# Patient Record
Sex: Male | Born: 1945 | Race: Black or African American | Hispanic: No | Marital: Married | Smoking: Never smoker
Health system: Southern US, Community
[De-identification: ages and names within clinical notes are randomized; demographics above are authoritative.]

## PROBLEM LIST (undated history)

## (undated) DIAGNOSIS — K635 Polyp of colon: Secondary | ICD-10-CM

## (undated) DIAGNOSIS — R42 Dizziness and giddiness: Secondary | ICD-10-CM

## (undated) DIAGNOSIS — C189 Malignant neoplasm of colon, unspecified: Secondary | ICD-10-CM

## (undated) DIAGNOSIS — R7303 Prediabetes: Secondary | ICD-10-CM

## (undated) DIAGNOSIS — I10 Essential (primary) hypertension: Secondary | ICD-10-CM

## (undated) DIAGNOSIS — I2510 Atherosclerotic heart disease of native coronary artery without angina pectoris: Secondary | ICD-10-CM

## (undated) DIAGNOSIS — K219 Gastro-esophageal reflux disease without esophagitis: Secondary | ICD-10-CM

## (undated) DIAGNOSIS — F419 Anxiety disorder, unspecified: Secondary | ICD-10-CM

## (undated) DIAGNOSIS — F0390 Unspecified dementia without behavioral disturbance: Secondary | ICD-10-CM

## (undated) DIAGNOSIS — G4733 Obstructive sleep apnea (adult) (pediatric): Secondary | ICD-10-CM

## (undated) DIAGNOSIS — E7800 Pure hypercholesterolemia, unspecified: Secondary | ICD-10-CM

## (undated) DIAGNOSIS — R739 Hyperglycemia, unspecified: Secondary | ICD-10-CM

## (undated) HISTORY — DX: Unspecified dementia, unspecified severity, without behavioral disturbance, psychotic disturbance, mood disturbance, and anxiety: F03.90

## (undated) HISTORY — DX: Atherosclerotic heart disease of native coronary artery without angina pectoris: I25.10

## (undated) HISTORY — DX: Hyperglycemia, unspecified: R73.9

## (undated) HISTORY — PX: COLONOSCOPY: SHX174

## (undated) HISTORY — DX: Malignant neoplasm of colon, unspecified: C18.9

## (undated) HISTORY — DX: Dizziness and giddiness: R42

## (undated) HISTORY — DX: Anxiety disorder, unspecified: F41.9

## (undated) HISTORY — PX: OTHER SURGICAL HISTORY: SHX169

## (undated) HISTORY — DX: Polyp of colon: K63.5

## (undated) HISTORY — DX: Essential (primary) hypertension: I10

## (undated) HISTORY — DX: Gastro-esophageal reflux disease without esophagitis: K21.9

## (undated) HISTORY — PX: CARDIAC CATHETERIZATION: SHX172

## (undated) HISTORY — DX: Prediabetes: R73.03

## (undated) HISTORY — DX: Obstructive sleep apnea (adult) (pediatric): G47.33

---

## 1994-12-11 HISTORY — PX: PARTIAL COLECTOMY: SHX5273

## 1999-07-28 ENCOUNTER — Ambulatory Visit (HOSPITAL_COMMUNITY): Admission: RE | Admit: 1999-07-28 | Discharge: 1999-07-28 | Payer: Self-pay | Admitting: Gastroenterology

## 2003-03-03 ENCOUNTER — Encounter (INDEPENDENT_AMBULATORY_CARE_PROVIDER_SITE_OTHER): Payer: Self-pay

## 2003-03-03 ENCOUNTER — Ambulatory Visit (HOSPITAL_COMMUNITY): Admission: RE | Admit: 2003-03-03 | Discharge: 2003-03-03 | Payer: Self-pay | Admitting: Gastroenterology

## 2006-09-04 ENCOUNTER — Encounter: Admission: RE | Admit: 2006-09-04 | Discharge: 2006-09-04 | Payer: Self-pay | Admitting: Gastroenterology

## 2008-08-26 ENCOUNTER — Emergency Department (HOSPITAL_COMMUNITY): Admission: EM | Admit: 2008-08-26 | Discharge: 2008-08-26 | Payer: Self-pay | Admitting: Emergency Medicine

## 2010-10-28 ENCOUNTER — Observation Stay (HOSPITAL_COMMUNITY): Admission: RE | Admit: 2010-10-28 | Discharge: 2010-10-29 | Payer: Self-pay | Admitting: Cardiology

## 2010-10-28 HISTORY — PX: CARDIAC CATHETERIZATION: SHX172

## 2011-01-10 ENCOUNTER — Other Ambulatory Visit: Payer: Self-pay | Admitting: Internal Medicine

## 2011-01-10 DIAGNOSIS — R059 Cough, unspecified: Secondary | ICD-10-CM

## 2011-01-10 DIAGNOSIS — R05 Cough: Secondary | ICD-10-CM

## 2011-01-19 ENCOUNTER — Ambulatory Visit
Admission: RE | Admit: 2011-01-19 | Discharge: 2011-01-19 | Disposition: A | Discharge status: Home / Self Care | Payer: Medicare Other | Source: Ambulatory Visit | Attending: Internal Medicine | Admitting: Internal Medicine

## 2011-01-19 DIAGNOSIS — R05 Cough: Secondary | ICD-10-CM

## 2011-01-19 DIAGNOSIS — R059 Cough, unspecified: Secondary | ICD-10-CM

## 2011-02-21 LAB — BASIC METABOLIC PANEL
BUN: 8 mg/dL (ref 6–23)
CO2: 30 mEq/L (ref 19–32)
Calcium: 8.8 mg/dL (ref 8.4–10.5)
Chloride: 104 mEq/L (ref 96–112)
Creatinine, Ser: 0.8 mg/dL (ref 0.4–1.5)
GFR calc non Af Amer: >60 mL/min (ref >60)
Glucose, Bld: 104 mg/dL — ABNORMAL HIGH (ref 70–99)
Potassium: 3.5 mEq/L (ref 3.5–5.1)
Sodium: 141 mEq/L (ref 135–145)

## 2011-02-21 LAB — CBC
HCT: 43.2 % (ref 39.0–52.0)
HCT: 44.9 % (ref 39.0–52.0)
Hemoglobin: 14.3 g/dL (ref 13.0–17.0)
Hemoglobin: 15.2 g/dL (ref 13.0–17.0)
MCH: 31.5 pg (ref 26.0–34.0)
MCH: 32 pg (ref 26.0–34.0)
MCHC: 33.1 g/dL (ref 30.0–36.0)
MCHC: 33.9 g/dL (ref 30.0–36.0)
MCV: 94.5 fL (ref 78.0–100.0)
MCV: 95.2 fL (ref 78.0–100.0)
Platelets: 223 10*3/uL (ref 150–400)
Platelets: 227 10*3/uL (ref 150–400)
RBC: 4.54 MIL/uL (ref 4.22–5.81)
RBC: 4.75 MIL/uL (ref 4.22–5.81)
RDW: 12.9 % (ref 11.5–15.5)
RDW: 13.1 % (ref 11.5–15.5)
WBC: 11.2 10*3/uL — ABNORMAL HIGH (ref 4.0–10.5)
WBC: 11.9 10*3/uL — ABNORMAL HIGH (ref 4.0–10.5)

## 2011-02-21 LAB — POCT I-STAT 3, ART BLOOD GAS (G3+)
Bicarbonate: 26.1 mEq/L — ABNORMAL HIGH (ref 20.0–24.0)
O2 Saturation: 91 %
Patient temperature: 98.6
TCO2: 27 mmol/L (ref 0–100)
pCO2 arterial: 45.3 mmHg — ABNORMAL HIGH (ref 35.0–45.0)
pH, Arterial: 7.369 (ref 7.350–7.450)
pO2, Arterial: 62 mmHg — ABNORMAL LOW (ref 80.0–100.0)

## 2011-02-21 LAB — POCT I-STAT 3, VENOUS BLOOD GAS (G3P V)
Acid-Base Excess: 2 mmol/L (ref 0.0–2.0)
Acid-Base Excess: 4 mmol/L — ABNORMAL HIGH (ref 0.0–2.0)
Bicarbonate: 28.6 mEq/L — ABNORMAL HIGH (ref 20.0–24.0)
Bicarbonate: 30.1 mEq/L — ABNORMAL HIGH (ref 20.0–24.0)
O2 Saturation: 49 %
O2 Saturation: 70 %
Patient temperature: 98.6
TCO2: 30 mmol/L (ref 0–100)
TCO2: 32 mmol/L (ref 0–100)
pCO2, Ven: 51.5 mmHg — ABNORMAL HIGH (ref 45.0–50.0)
pCO2, Ven: 53.5 mmHg — ABNORMAL HIGH (ref 45.0–50.0)
pH, Ven: 7.335 — ABNORMAL HIGH (ref 7.250–7.300)
pH, Ven: 7.374 — ABNORMAL HIGH (ref 7.250–7.300)
pO2, Ven: 29 mmHg — CL (ref 30.0–45.0)
pO2, Ven: 38 mmHg (ref 30.0–45.0)

## 2011-03-14 ENCOUNTER — Other Ambulatory Visit: Payer: Self-pay | Admitting: Otolaryngology

## 2011-04-11 ENCOUNTER — Ambulatory Visit
Admission: RE | Admit: 2011-04-11 | Discharge: 2011-04-11 | Disposition: A | Discharge status: Home / Self Care | Payer: Medicare Other | Source: Ambulatory Visit | Attending: Otolaryngology | Admitting: Otolaryngology

## 2011-04-27 ENCOUNTER — Other Ambulatory Visit: Payer: Self-pay | Admitting: Family Medicine

## 2011-04-27 ENCOUNTER — Ambulatory Visit
Admission: RE | Admit: 2011-04-27 | Discharge: 2011-04-27 | Disposition: A | Discharge status: Home / Self Care | Payer: Medicare Other | Source: Ambulatory Visit | Attending: Family Medicine | Admitting: Family Medicine

## 2011-04-27 DIAGNOSIS — J209 Acute bronchitis, unspecified: Secondary | ICD-10-CM

## 2011-04-28 NOTE — Op Note (Signed)
NAMEMERRIL, NAGY                            ACCOUNT NO.:  [id number]   MEDICAL RECORD NO.:  [id number]                   PATIENT TYPE:  AMB   LOCATION:  ENDO                                 FACILITY:  [facility]   PHYSICIAN:  Petra Kuba, M.D.                 DATE OF BIRTH:  1946-03-01   DATE OF PROCEDURE:  03/03/2003  DATE OF DISCHARGE:                                 OPERATIVE REPORT   PROCEDURE:  Colonoscopy with polypectomy.   INDICATIONS FOR PROCEDURE:  A patient with a history of colon cancer due for  repeat screening. Consent was signed after risks, benefits, methods, and  options were thoroughly discussed multiple times in the past.   MEDICINES USED:  Demerol 70, Versed 5.   DESCRIPTION OF PROCEDURE:  Rectal inspection was pertinent for external  hemorrhoids, small. Digital exam was negative. The video colonoscope was  inserted, easily advanced around the colon to the anastomosis. On insertion,  some left sided diverticula were seen as well as a small sigmoid polyp which  was hot biopsied on insertion. No other abnormalities were seen on  insertion. The anastomosis was identified by the colon side small bowel  anastomosis. Based on a very tortuous turn could not advance deep into the  small bowel but could see the customary lymphoid tissue of the small bowel.  Some of it did extend into the anastomosis and a few biopsies of that area  were obtained just to make sure this was not polypoid tissue. That was put  in the second container. The scope was slowly withdrawn. The prep was  adequate. There was some liquid stool that required washing and suctioning.  The transverse was normal and as the scope was withdrawn around the left  side of the colon in the mid to distal sigmoid and proximal rectum, multiple  hyperplastic appearing polyps were seen some of which were hot biopsied and  put in the first container with the other polyp seen on insertion. That  polypectomy site was  seen without obvious residual polyp. Once back in the  rectum, the scope was retroflexed pertinent for some small internal  hemorrhoids. The scope was straightened and readvanced a short ways up the  left side of the colon, air was suctioned, scope removed. The patient  tolerated the procedure well. There was no obvious or immediate  complications.   ENDOSCOPIC DIAGNOSIS:  1. Internal and external hemorrhoids.  2. Left sided diverticula.  3. Multiple left sided hyperplastic appearing polyps hot biopsied.  4. Anastomosis with a small bowel end to colon side anastomosis.  5. Biopsy of the anastomosis probable lymphoid hyperplasia.    PLAN:  Await pathology to determine future colonic screening. Happy to see  back p.r.n., otherwise, return care to Dr. Cyndie Chime for the customary  post colon cancer health care and maintenance. Happy to see back sooner  p.r.n.  Petra Kuba, M.D.    MEM/MEDQ  D:  03/03/2003  T:  03/03/2003  Job:  [id number]   cc:   Leonie Man, M.D.  [address], [address]  [place]  [place] [address]  Fax: [phone]   Genene Churn. Cyndie Chime, M.D.  [address] [facility]  [place]  [place] [address]  Fax: [phone]

## 2011-05-04 ENCOUNTER — Encounter: Payer: Self-pay | Admitting: Gastroenterology

## 2011-06-20 ENCOUNTER — Ambulatory Visit: Payer: Medicare Other | Admitting: Gastroenterology

## 2011-09-11 LAB — GLUCOSE, CAPILLARY: Glucose-Capillary: 102 — ABNORMAL HIGH

## 2015-01-20 DIAGNOSIS — I10 Essential (primary) hypertension: Secondary | ICD-10-CM | POA: Diagnosis not present

## 2015-01-20 DIAGNOSIS — R0609 Other forms of dyspnea: Secondary | ICD-10-CM | POA: Diagnosis not present

## 2015-01-20 DIAGNOSIS — N529 Male erectile dysfunction, unspecified: Secondary | ICD-10-CM | POA: Diagnosis not present

## 2015-01-20 DIAGNOSIS — I2510 Atherosclerotic heart disease of native coronary artery without angina pectoris: Secondary | ICD-10-CM | POA: Diagnosis not present

## 2015-03-09 DIAGNOSIS — E780 Pure hypercholesterolemia: Secondary | ICD-10-CM | POA: Diagnosis not present

## 2015-03-09 DIAGNOSIS — I10 Essential (primary) hypertension: Secondary | ICD-10-CM | POA: Diagnosis not present

## 2015-03-09 DIAGNOSIS — I2510 Atherosclerotic heart disease of native coronary artery without angina pectoris: Secondary | ICD-10-CM | POA: Diagnosis not present

## 2015-03-24 DIAGNOSIS — Z6841 Body Mass Index (BMI) 40.0 and over, adult: Secondary | ICD-10-CM | POA: Diagnosis not present

## 2015-03-24 DIAGNOSIS — E662 Morbid (severe) obesity with alveolar hypoventilation: Secondary | ICD-10-CM | POA: Diagnosis not present

## 2015-03-24 DIAGNOSIS — I10 Essential (primary) hypertension: Secondary | ICD-10-CM | POA: Diagnosis not present

## 2015-09-23 DIAGNOSIS — I10 Essential (primary) hypertension: Secondary | ICD-10-CM | POA: Diagnosis not present

## 2015-09-23 DIAGNOSIS — E7800 Pure hypercholesterolemia, unspecified: Secondary | ICD-10-CM | POA: Diagnosis not present

## 2015-09-23 DIAGNOSIS — I2510 Atherosclerotic heart disease of native coronary artery without angina pectoris: Secondary | ICD-10-CM | POA: Diagnosis not present

## 2015-09-23 DIAGNOSIS — E662 Morbid (severe) obesity with alveolar hypoventilation: Secondary | ICD-10-CM | POA: Diagnosis not present

## 2016-04-07 DIAGNOSIS — I2510 Atherosclerotic heart disease of native coronary artery without angina pectoris: Secondary | ICD-10-CM | POA: Diagnosis not present

## 2016-04-07 DIAGNOSIS — E7800 Pure hypercholesterolemia, unspecified: Secondary | ICD-10-CM | POA: Diagnosis not present

## 2016-04-07 DIAGNOSIS — I10 Essential (primary) hypertension: Secondary | ICD-10-CM | POA: Diagnosis not present

## 2016-04-11 DIAGNOSIS — E299 Testicular dysfunction, unspecified: Secondary | ICD-10-CM | POA: Diagnosis not present

## 2016-04-11 DIAGNOSIS — I10 Essential (primary) hypertension: Secondary | ICD-10-CM | POA: Diagnosis not present

## 2016-04-11 DIAGNOSIS — N390 Urinary tract infection, site not specified: Secondary | ICD-10-CM | POA: Diagnosis not present

## 2016-04-11 DIAGNOSIS — I25119 Atherosclerotic heart disease of native coronary artery with unspecified angina pectoris: Secondary | ICD-10-CM | POA: Diagnosis not present

## 2016-04-18 DIAGNOSIS — E782 Mixed hyperlipidemia: Secondary | ICD-10-CM | POA: Diagnosis not present

## 2016-04-18 DIAGNOSIS — E876 Hypokalemia: Secondary | ICD-10-CM | POA: Diagnosis not present

## 2016-04-18 DIAGNOSIS — R7303 Prediabetes: Secondary | ICD-10-CM | POA: Diagnosis not present

## 2016-04-18 DIAGNOSIS — I2510 Atherosclerotic heart disease of native coronary artery without angina pectoris: Secondary | ICD-10-CM | POA: Diagnosis not present

## 2016-10-31 DIAGNOSIS — E876 Hypokalemia: Secondary | ICD-10-CM | POA: Diagnosis not present

## 2016-10-31 DIAGNOSIS — E782 Mixed hyperlipidemia: Secondary | ICD-10-CM | POA: Diagnosis not present

## 2016-11-07 DIAGNOSIS — E876 Hypokalemia: Secondary | ICD-10-CM | POA: Diagnosis not present

## 2016-11-07 DIAGNOSIS — Z23 Encounter for immunization: Secondary | ICD-10-CM | POA: Diagnosis not present

## 2016-11-07 DIAGNOSIS — R7303 Prediabetes: Secondary | ICD-10-CM | POA: Diagnosis not present

## 2016-11-07 DIAGNOSIS — E782 Mixed hyperlipidemia: Secondary | ICD-10-CM | POA: Diagnosis not present

## 2016-11-07 DIAGNOSIS — I2510 Atherosclerotic heart disease of native coronary artery without angina pectoris: Secondary | ICD-10-CM | POA: Diagnosis not present

## 2017-02-09 DIAGNOSIS — M26609 Unspecified temporomandibular joint disorder, unspecified side: Secondary | ICD-10-CM | POA: Diagnosis not present

## 2017-04-05 DIAGNOSIS — E7800 Pure hypercholesterolemia, unspecified: Secondary | ICD-10-CM | POA: Diagnosis not present

## 2017-04-05 DIAGNOSIS — I2510 Atherosclerotic heart disease of native coronary artery without angina pectoris: Secondary | ICD-10-CM | POA: Diagnosis not present

## 2017-04-05 DIAGNOSIS — I10 Essential (primary) hypertension: Secondary | ICD-10-CM | POA: Diagnosis not present

## 2017-04-17 DIAGNOSIS — Z0000 Encounter for general adult medical examination without abnormal findings: Secondary | ICD-10-CM | POA: Diagnosis not present

## 2017-04-17 DIAGNOSIS — R7303 Prediabetes: Secondary | ICD-10-CM | POA: Diagnosis not present

## 2017-04-17 DIAGNOSIS — E782 Mixed hyperlipidemia: Secondary | ICD-10-CM | POA: Diagnosis not present

## 2017-04-17 DIAGNOSIS — Z125 Encounter for screening for malignant neoplasm of prostate: Secondary | ICD-10-CM | POA: Diagnosis not present

## 2017-04-17 DIAGNOSIS — I2510 Atherosclerotic heart disease of native coronary artery without angina pectoris: Secondary | ICD-10-CM | POA: Diagnosis not present

## 2017-04-24 DIAGNOSIS — Z23 Encounter for immunization: Secondary | ICD-10-CM | POA: Diagnosis not present

## 2017-04-24 DIAGNOSIS — I2510 Atherosclerotic heart disease of native coronary artery without angina pectoris: Secondary | ICD-10-CM | POA: Diagnosis not present

## 2017-04-24 DIAGNOSIS — R7303 Prediabetes: Secondary | ICD-10-CM | POA: Diagnosis not present

## 2017-04-24 DIAGNOSIS — E782 Mixed hyperlipidemia: Secondary | ICD-10-CM | POA: Diagnosis not present

## 2017-04-24 DIAGNOSIS — R51 Headache: Secondary | ICD-10-CM | POA: Diagnosis not present

## 2017-04-24 DIAGNOSIS — M26609 Unspecified temporomandibular joint disorder, unspecified side: Secondary | ICD-10-CM | POA: Diagnosis not present

## 2017-05-22 DIAGNOSIS — I2510 Atherosclerotic heart disease of native coronary artery without angina pectoris: Secondary | ICD-10-CM | POA: Diagnosis not present

## 2017-05-22 DIAGNOSIS — E782 Mixed hyperlipidemia: Secondary | ICD-10-CM | POA: Diagnosis not present

## 2017-08-02 DIAGNOSIS — R7303 Prediabetes: Secondary | ICD-10-CM | POA: Diagnosis not present

## 2017-08-02 DIAGNOSIS — R2681 Unsteadiness on feet: Secondary | ICD-10-CM | POA: Diagnosis not present

## 2017-08-02 DIAGNOSIS — I2510 Atherosclerotic heart disease of native coronary artery without angina pectoris: Secondary | ICD-10-CM | POA: Diagnosis not present

## 2017-09-06 ENCOUNTER — Emergency Department (HOSPITAL_COMMUNITY): Payer: Medicare Other

## 2017-09-06 ENCOUNTER — Encounter (HOSPITAL_COMMUNITY): Payer: Self-pay | Admitting: Emergency Medicine

## 2017-09-06 ENCOUNTER — Emergency Department (HOSPITAL_COMMUNITY)
Admission: EM | Admit: 2017-09-06 | Discharge: 2017-09-06 | Disposition: A | Discharge status: Home / Self Care | Payer: Medicare Other | Attending: Emergency Medicine | Admitting: Emergency Medicine

## 2017-09-06 DIAGNOSIS — Z85038 Personal history of other malignant neoplasm of large intestine: Secondary | ICD-10-CM | POA: Insufficient documentation

## 2017-09-06 DIAGNOSIS — Z79899 Other long term (current) drug therapy: Secondary | ICD-10-CM | POA: Insufficient documentation

## 2017-09-06 DIAGNOSIS — R42 Dizziness and giddiness: Secondary | ICD-10-CM | POA: Diagnosis not present

## 2017-09-06 DIAGNOSIS — I2510 Atherosclerotic heart disease of native coronary artery without angina pectoris: Secondary | ICD-10-CM | POA: Insufficient documentation

## 2017-09-06 DIAGNOSIS — Z7982 Long term (current) use of aspirin: Secondary | ICD-10-CM | POA: Insufficient documentation

## 2017-09-06 DIAGNOSIS — Z7902 Long term (current) use of antithrombotics/antiplatelets: Secondary | ICD-10-CM | POA: Insufficient documentation

## 2017-09-06 DIAGNOSIS — R0602 Shortness of breath: Secondary | ICD-10-CM | POA: Insufficient documentation

## 2017-09-06 DIAGNOSIS — I639 Cerebral infarction, unspecified: Secondary | ICD-10-CM | POA: Diagnosis not present

## 2017-09-06 HISTORY — DX: Pure hypercholesterolemia, unspecified: E78.00

## 2017-09-06 LAB — URINALYSIS, ROUTINE W REFLEX MICROSCOPIC
Bilirubin Urine: NEGATIVE
Glucose, UA: NEGATIVE mg/dL
Hgb urine dipstick: NEGATIVE
Ketones, ur: NEGATIVE mg/dL
Leukocytes, UA: NEGATIVE
Nitrite: NEGATIVE
Protein, ur: NEGATIVE mg/dL
Specific Gravity, Urine: 1.017 (ref 1.005–1.030)
pH: 5 (ref 5.0–8.0)

## 2017-09-06 LAB — BASIC METABOLIC PANEL
Anion gap: 10 (ref 5–15)
BUN: 20 mg/dL (ref 6–20)
CO2: 31 mmol/L (ref 22–32)
Calcium: 9.3 mg/dL (ref 8.9–10.3)
Chloride: 100 mmol/L — ABNORMAL LOW (ref 101–111)
Creatinine, Ser: 1.06 mg/dL (ref 0.61–1.24)
GFR calc Af Amer: >60 mL/min (ref >60)
GFR calc non Af Amer: >60 mL/min (ref >60)
Glucose, Bld: 113 mg/dL — ABNORMAL HIGH (ref 65–99)
Potassium: 3 mmol/L — ABNORMAL LOW (ref 3.5–5.1)
Sodium: 141 mmol/L (ref 135–145)

## 2017-09-06 LAB — CBC
HCT: 40.1 % (ref 39.0–52.0)
Hemoglobin: 13.2 g/dL (ref 13.0–17.0)
MCH: 30.6 pg (ref 26.0–34.0)
MCHC: 32.9 g/dL (ref 30.0–36.0)
MCV: 93 fL (ref 78.0–100.0)
Platelets: 240 10*3/uL (ref 150–400)
RBC: 4.31 MIL/uL (ref 4.22–5.81)
RDW: 14.5 % (ref 11.5–15.5)
WBC: 9.1 10*3/uL (ref 4.0–10.5)

## 2017-09-06 LAB — MAGNESIUM: Magnesium: 1.9 mg/dL (ref 1.7–2.4)

## 2017-09-06 LAB — BRAIN NATRIURETIC PEPTIDE: B Natriuretic Peptide: 68.9 pg/mL (ref 0.0–100.0)

## 2017-09-06 LAB — I-STAT TROPONIN, ED
Troponin i, poc: 0.02 ng/mL (ref 0.00–0.08)
Troponin i, poc: 0.03 ng/mL (ref 0.00–0.08)

## 2017-09-06 MED ORDER — SODIUM CHLORIDE 0.9 % IV BOLUS (SEPSIS): 1000.0000 mL | Freq: Once | INTRAVENOUS | Status: DC

## 2017-09-06 MED ORDER — MECLIZINE HCL 25 MG PO TABS
25.0000 mg | ORAL_TABLET | Freq: Once | ORAL | Status: AC
  Administered 2017-09-06: 25 mg via ORAL
  Filled 2017-09-06: qty 1

## 2017-09-06 MED ORDER — SODIUM CHLORIDE 0.9 % IV BOLUS (SEPSIS)
1000.0000 mL | Freq: Once | INTRAVENOUS | Status: AC
  Administered 2017-09-06: 1000 mL via INTRAVENOUS

## 2017-09-06 MED ORDER — MECLIZINE HCL 25 MG PO TABS: 25.0000 mg | ORAL_TABLET | Freq: Three times a day (TID) | ORAL | 0 refills | Status: DC | PRN

## 2017-09-06 MED ORDER — POTASSIUM CHLORIDE CRYS ER 20 MEQ PO TBCR
40.0000 meq | EXTENDED_RELEASE_TABLET | Freq: Once | ORAL | Status: AC
  Administered 2017-09-06: 40 meq via ORAL
  Filled 2017-09-06: qty 2

## 2017-09-06 NOTE — ED Notes (Signed)
Patient verbalized understanding of discharge instructions and denies any further needs or questions at this time. VS stable. Patient ambulatory with steady gait.  

## 2017-09-06 NOTE — ED Notes (Signed)
MD at bedside. 

## 2017-09-06 NOTE — Discharge Instructions (Signed)
Please follow-up with your primary care physician within the next week.

## 2017-09-06 NOTE — ED Provider Notes (Signed)
[facility] DEPT Provider Note   CSN: [id number] Arrival date & time: 09/06/17  1223  History   Chief Complaint Chief Complaint  Patient presents with  . Dizziness   HPI Justin Davidson is a 71 y.o. male.  The patient is a 71yo male with a past medical history significant for CAD with cardiac stents (no history of MI), pre-diabetes, and colon cancer who presents to the ED in the company of his wife complaining of dizziness.  He reports 6-7 weeks of intermittent "wooziness."  He has been experiencing intermittent nausea with these episodes.  He has not identified a temporal pattern to his symptoms; they come and go.  He also complains of dyspnea with exertion, worse when climbing stairs.  He has been compliant with all of his medications.  Last cardiology appointment ~ 6 months ago (next appointment scheduled in 2019).  Today, the patient woke up and his symptoms were more severe, which prompted his ED visit.  His wife reports that he seemed very off balance.    The history is provided by the patient and the spouse. No language interpreter was used.  Dizziness  Progression:  Worsening Chronicity:  New Context: physical activity   Associated symptoms: nausea and shortness of breath   Associated symptoms: no chest pain, no headaches, no palpitations, no syncope, no vomiting and no weakness    Past Medical History:  Diagnosis Date  . Hypercholesteremia    There are no active problems to display for this patient.  Past Surgical History:  Procedure Laterality Date  . cardaic stents      Home Medications    Prior to Admission medications   Medication Sig Start Date End Date Taking? Authorizing Provider  aspirin EC 81 MG tablet Take 81 mg by mouth daily.   Yes [provider]  atorvastatin (LIPITOR) 80 MG tablet Take 80 mg by mouth daily. 07/16/17  Yes [provider]  clopidogrel (PLAVIX) 75 MG tablet Take 75 mg by mouth daily. 06/22/17  Yes [provider]   diclofenac (CATAFLAM) 50 MG tablet Take 50 mg by mouth 2 (two) times daily. 07/13/17  Yes [provider]  donepezil (ARICEPT) 23 MG TABS tablet Take 23 mg by mouth daily.   Yes [provider]  losartan-hydrochlorothiazide (HYZAAR) 100-12.5 MG tablet Take 1 tablet by mouth every morning. 06/22/17  Yes [provider]  metoprolol tartrate (LOPRESSOR) 50 MG tablet Take 50 mg by mouth 2 (two) times daily. 06/22/17  Yes [provider]  meclizine (ANTIVERT) 25 MG tablet Take 1 tablet (25 mg total) by mouth 3 (three) times daily as needed for dizziness. 09/06/17   Charisse March, MD   Family History No family history on file.  Social History Social History  Substance Use Topics  . Smoking status: Not on file  . Smokeless tobacco: Not on file  . Alcohol use Not on file    Allergies   Patient has no known allergies.  Review of Systems Review of Systems  Constitutional: Negative for fatigue and fever.  HENT: Negative.   Eyes: Negative for visual disturbance.  Respiratory: Positive for shortness of breath. Negative for wheezing.   Cardiovascular: Negative for chest pain, palpitations, leg swelling and syncope.  Gastrointestinal: Positive for nausea. Negative for vomiting.  Genitourinary: Negative for decreased urine volume.  Musculoskeletal: Negative.   Allergic/Immunologic: Negative for immunocompromised state.  Neurological: Positive for dizziness. Negative for syncope, facial asymmetry, weakness and headaches.   Physical Exam Updated Vital Signs BP Marland Kitchen)  141/79 (BP Location: Left Arm)   Pulse 91   Temp 97.6 F (36.4 C)   Resp 18   SpO2 97%   Physical Exam  Constitutional: He is oriented to person, place, and time. He appears well-developed and well-nourished.  HENT:  Head: Normocephalic and atraumatic.  Mouth/Throat: Oropharynx is clear and moist.  Eyes: Pupils are equal, round, and reactive to light. Conjunctivae and EOM are normal.  Neck:  Normal range of motion.  Cardiovascular: Regular rhythm and intact distal pulses.   Murmur heard. bradycardia  Pulmonary/Chest: Effort normal and breath sounds normal. No respiratory distress. He has no wheezes. He has no rales.  Abdominal: Soft. Bowel sounds are normal. He exhibits no distension. There is no tenderness.  Musculoskeletal: He exhibits edema (1+ bilateral, pitting).  Neurological: He is alert and oriented to person, place, and time. No cranial nerve deficit or sensory deficit. He exhibits normal muscle tone. Coordination normal.  Skin: Skin is warm.  Psychiatric: He has a normal mood and affect. His behavior is normal. Judgment and thought content normal.  Nursing note and vitals reviewed.  EKG: Rate: bradycardia Rhythm: normal sinus Intervals: normal PR, QRS complex, and QTc Segments: No T wave or ST changes suggestive of ischemia/infarct Comparison to prior: none available  ED Treatments / Results  Labs (all labs ordered are listed, but only abnormal results are displayed) Labs Reviewed  BASIC METABOLIC PANEL - Abnormal; Notable for the following:       Result Value   Potassium 3.0 (*)    Chloride 100 (*)    Glucose, Bld 113 (*)    All other components within normal limits  CBC  URINALYSIS, ROUTINE W REFLEX MICROSCOPIC  BRAIN NATRIURETIC PEPTIDE  MAGNESIUM  CBG MONITORING, ED  I-STAT TROPONIN, ED  I-STAT TROPONIN, ED   EKG  EKG Interpretation  Date/Time:  Thursday September 06 2017 12:33:53 EDT Ventricular Rate:  48 PR Interval:  178 QRS Duration: 82 QT Interval:  454 QTC Calculation: 405 R Axis:   60 Text Interpretation:  Sinus bradycardia Otherwise normal ECG No significant change since last tracing Confirmed by Wandra Arthurs [phone]) on 09/06/2017 2:59:40 PM      Radiology Dg Chest 2 View  Result Date: 09/06/2017 CLINICAL DATA:  Shortness of breath. EXAM: CHEST  2 VIEW COMPARISON:  04/27/2011 . FINDINGS: Mediastinum and hilar structures normal.  Heart size normal. The lungs are clear. Mild bibasilar subsegmental atelectasis . No pleural effusion or pneumothorax. Degenerative changes thoracic spine. IMPRESSION: Mild bibasilar subsegmental atelectasis. Exam otherwise unremarkable pain Electronically Signed   By: Marcello Moores  Register   On: 09/06/2017 13:23   Mr Brain Wo Contrast  Result Date: 09/06/2017 CLINICAL DATA:  Transient ischemic attack. History of hypercholesterolemia. EXAM: MRI HEAD WITHOUT CONTRAST TECHNIQUE: Multiplanar, multiecho pulse sequences of the brain and surrounding structures were obtained without intravenous contrast. COMPARISON:  None. FINDINGS: BRAIN: No reduced diffusion to suggest acute ischemia. No susceptibility artifact to suggest hemorrhage. The ventricles and sulci are normal for patient's age. Patchy supratentorial white matter FLAIR T2 hyperintensities compatible with mild chronic small vessel ischemic disease, normal for age. No suspicious parenchymal signal, mass or mass effect. No abnormal extra-axial fluid collections. VASCULAR: Normal major intracranial vascular flow voids present at skull base. SKULL AND UPPER CERVICAL SPINE: No abnormal sellar expansion. No suspicious calvarial bone marrow signal. Craniocervical junction maintained. SINUSES/ORBITS: Trace paranasal sinus mucosal thickening. Mastoid air cells are well aerated. The included ocular globes and orbital contents are non-suspicious. OTHER: None.  IMPRESSION: Negative noncontrast MRI of the head for age. Electronically Signed   By: Elon Alas M.D.   On: 09/06/2017 20:29   Procedures Procedures (including critical care time)  Medications Ordered in ED Medications  potassium chloride SA (K-DUR,KLOR-CON) CR tablet 40 mEq (40 mEq Oral Given 09/06/17 1505)  meclizine (ANTIVERT) tablet 25 mg (25 mg Oral Given 09/06/17 1554)  sodium chloride 0.9 % bolus 1,000 mL (0 mLs Intravenous Stopped 09/06/17 2123)   Initial Impression / Assessment and Plan / ED  Course  I have reviewed the triage vital signs and the nursing notes.  Pertinent labs & imaging results that were available during my care of the patient were reviewed by me and considered in my medical decision making (see chart for details).   Initial differential diagnosis included heart failure exacerbation, ACS, posterior circulation stroke, BPPV, symptomatic bradycardia, metabolic derangement.  Pertinent labs included CBC without leukocytosis, anemia, or an abnormal platelet count.  BMP notable for hypokalemia, however no AKI or anion gap noted.  UA without evidence of infection.  BNP normal, decreasing my suspicion for a heart failure exacerbation.  Serial troponins negative.  Normal magnesium. Imaging studies included an MRI of the head with no acute abnormalities.  The patient was given potassium, IVF, and meclizine for his vertiginous symptoms.  Upon reassessment, he had no new symptoms or complaints and remained hemodynamically stable.  Based on the above findings, I suspect the patient is suffering from unspecified vertigo at this time.  His normal MRI is reassuring and decreases my suspicion for a posterior circulation stroke.  Alternatively, the patient may be suffering from symptomatic bradycardia, and he was instructed to follow-up with his cardiologist for further evaluation.  I discussed the above results with the patient who verbalized understanding.  Return precautions and follow-up plans discussed including the importance of close PCP follow-up.  The patient was discharged in stable condition with a prescription for Meclizine for return of symptoms.  Final Clinical Impressions(s) / ED Diagnoses   Final diagnoses:  Vertigo   New Prescriptions Discharge Medication List as of 09/06/2017  9:37 PM    START taking these medications   Details  meclizine (ANTIVERT) 25 MG tablet Take 1 tablet (25 mg total) by mouth 3 (three) times daily as needed for dizziness., Starting Thu  09/06/2017, Print         Charisse March, MD 09/07/17 Leandrew Koyanagi    Drenda Freeze, MD 09/08/17 [phone]

## 2017-09-06 NOTE — ED Triage Notes (Signed)
Pt reports dizziness x2 months and sob x1 month, pt a/ox4, resp e/u, no neuro deficits, states he saw PCP about dizziness and was not given a dx.

## 2017-09-06 NOTE — ED Notes (Signed)
Patient transported to MRI 

## 2017-09-25 DIAGNOSIS — I2510 Atherosclerotic heart disease of native coronary artery without angina pectoris: Secondary | ICD-10-CM | POA: Diagnosis not present

## 2017-09-25 DIAGNOSIS — E782 Mixed hyperlipidemia: Secondary | ICD-10-CM | POA: Diagnosis not present

## 2017-10-02 DIAGNOSIS — E782 Mixed hyperlipidemia: Secondary | ICD-10-CM | POA: Diagnosis not present

## 2017-10-02 DIAGNOSIS — R490 Dysphonia: Secondary | ICD-10-CM | POA: Diagnosis not present

## 2017-10-02 DIAGNOSIS — R7303 Prediabetes: Secondary | ICD-10-CM | POA: Diagnosis not present

## 2017-10-02 DIAGNOSIS — I2510 Atherosclerotic heart disease of native coronary artery without angina pectoris: Secondary | ICD-10-CM | POA: Diagnosis not present

## 2017-10-02 DIAGNOSIS — Z23 Encounter for immunization: Secondary | ICD-10-CM | POA: Diagnosis not present

## 2017-10-04 DIAGNOSIS — J300 Vasomotor rhinitis: Secondary | ICD-10-CM | POA: Insufficient documentation

## 2017-10-04 DIAGNOSIS — G4733 Obstructive sleep apnea (adult) (pediatric): Secondary | ICD-10-CM | POA: Diagnosis not present

## 2017-11-20 IMAGING — MR MR HEAD W/O CM
9 of 10 series · 35 of 48 positions shown · non-contrast
Comparison: None.

CLINICAL DATA: Transient ischemic attack. History of
hypercholesterolemia.

EXAM:
MRI HEAD WITHOUT CONTRAST
TECHNIQUE: Multiplanar, multiecho pulse sequences of the brain and surrounding
structures were obtained without intravenous contrast.

[Series 3: DWI · axial · 3.0mm · 0.94mm/px · z∈[-86,+60]mm · 8 of 96 slices shown (1 of 2)]
[im 1/96]
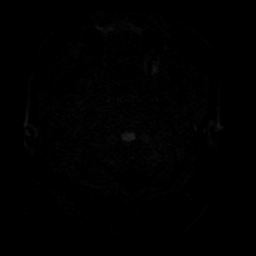
[im 11/96]
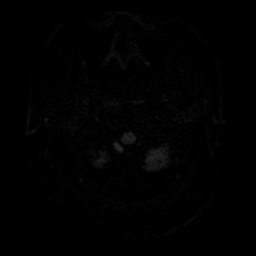
[im 32/96]
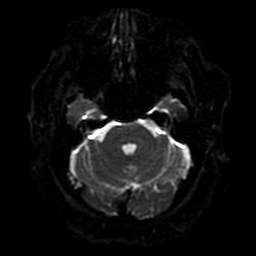
[im 43/96]
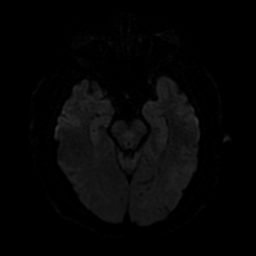
[im 53/96]
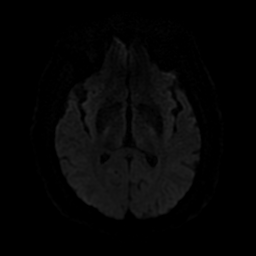
[im 64/96]
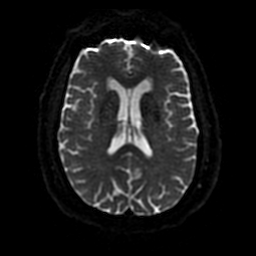
[im 85/96]
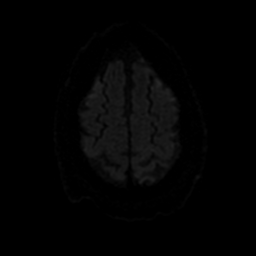
[im 96/96]
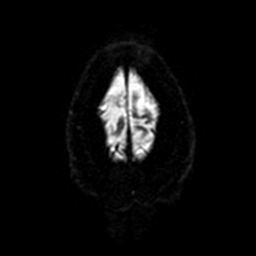

[Series 4: DWI · coronal · 4.0mm · 0.94mm/px · 7 of 80 slices shown (2 of 2)]
[im 1/80]
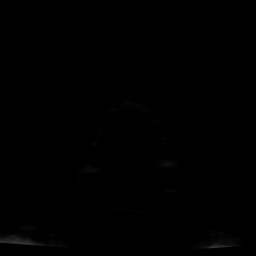
[im 14/80]
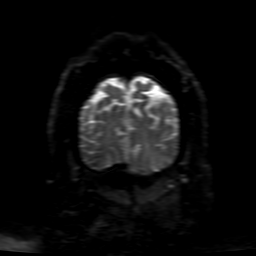
[im 27/80]
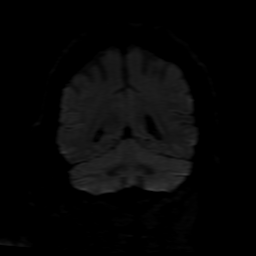
[im 40/80]
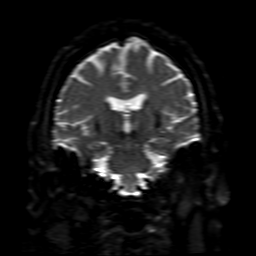
[im 53/80]
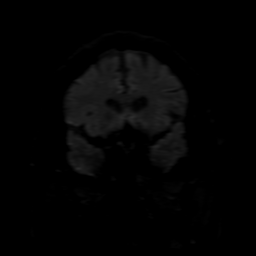
[im 66/80]
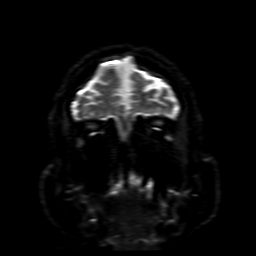
[im 80/80]
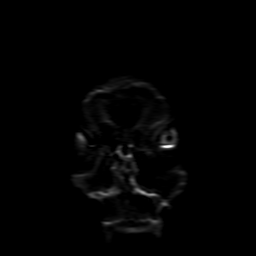

[Series 6: T2 · axial · 5.0mm · 0.47mm/px · z∈[-87,+81]mm · 3 of 29 slices shown (1 of 2)]
[im 1/29]
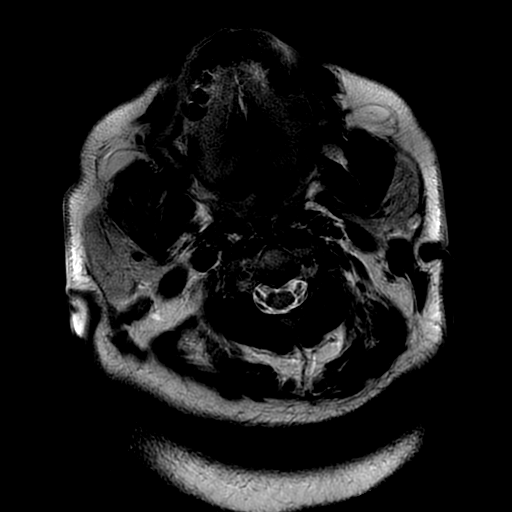
[im 15/29]
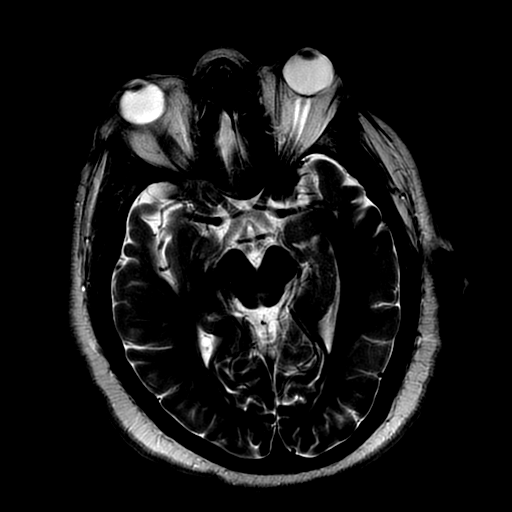
[im 29/29]
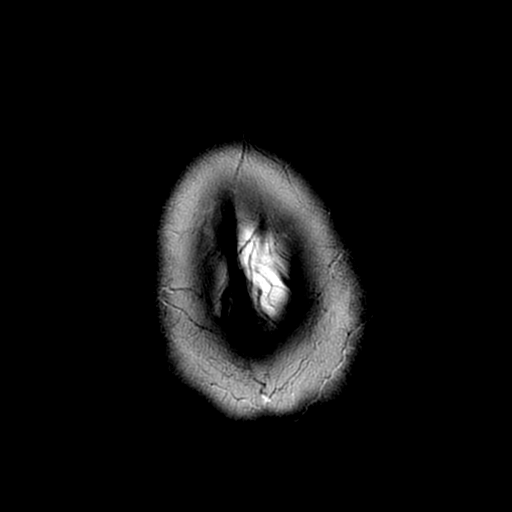

[Series 7: FLAIR · axial · 5.0mm · 0.47mm/px · z∈[-87,+81]mm · 3 of 29 slices shown (1 of 2)]
[im 1/29]
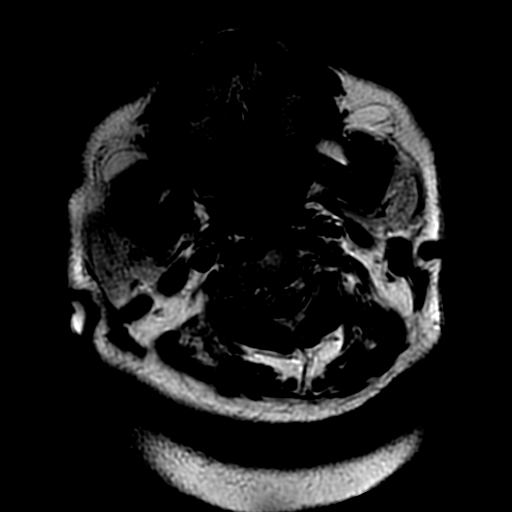
[im 15/29]
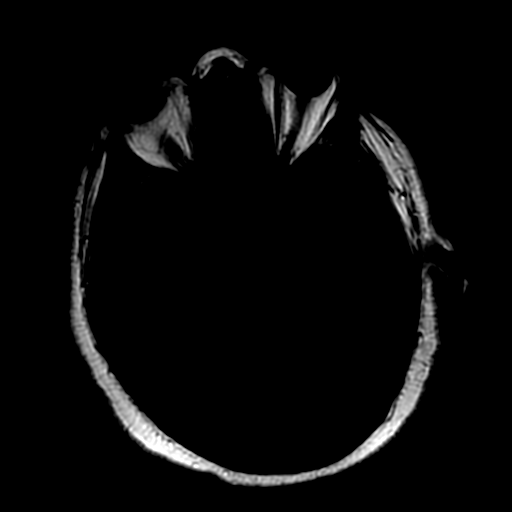
[im 29/29]
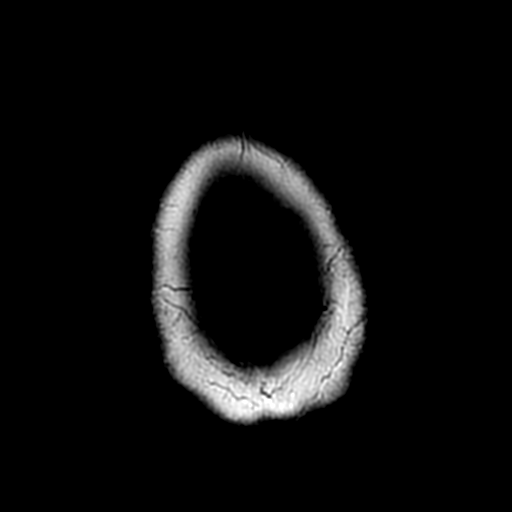

[Series 8: (person_name) · axial · 3.0mm · 0.47mm/px · z∈[-73,-55]mm · 2 of 100 slices shown]
[im 1/100]
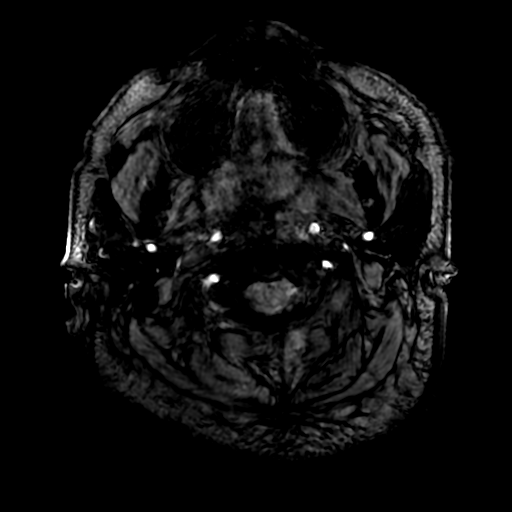
[im 13/100]
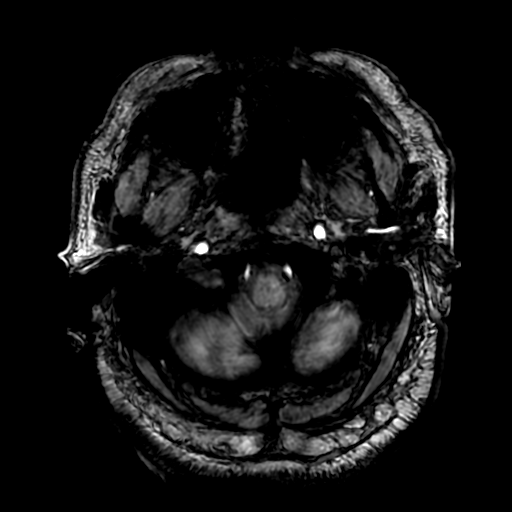

[Series 9: FLAIR · sagittal · 5.0mm · 0.49mm/px · 2 of 27 slices shown (2 of 2)]
[im 1/27]
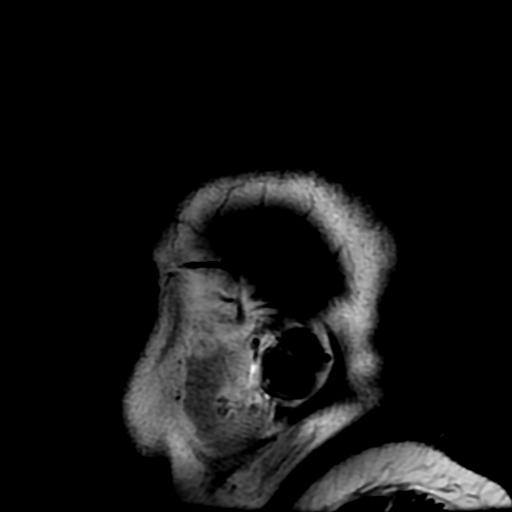
[im 27/27]
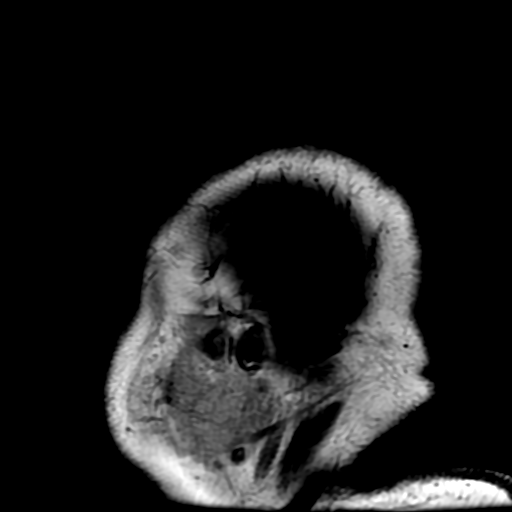

[Series 11: T2 · coronal · 5.0mm · 0.47mm/px · 2 of 24 slices shown (2 of 2)]
[im 1/24]
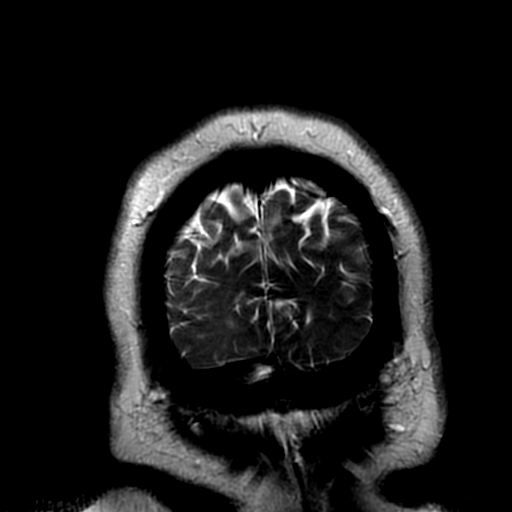
[im 24/24]
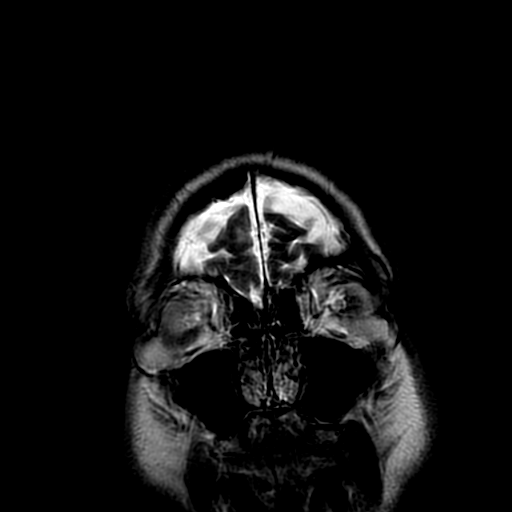

[Series 350: ADC · axial · 3.0mm · 0.94mm/px · z∈[-86,+60]mm · 4 of 48 slices shown (1 of 2)]
[im 1/48]
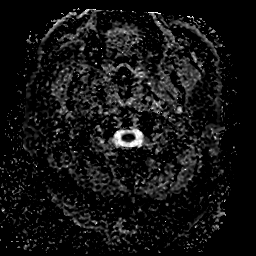
[im 16/48]
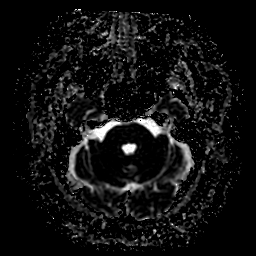
[im 32/48]
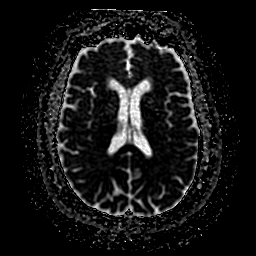
[im 48/48]
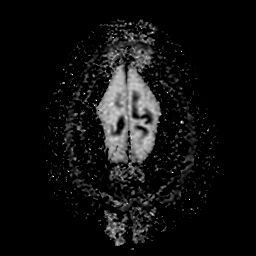

[Series 450: ADC · coronal · 4.0mm · 0.94mm/px · 4 of 40 slices shown (2 of 2)]
[im 1/40]
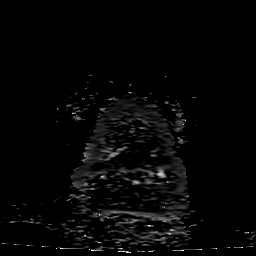
[im 14/40]
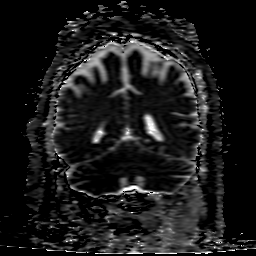
[im 27/40]
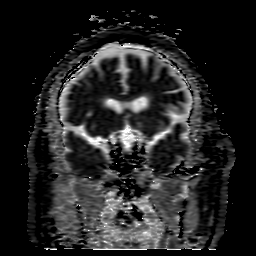
[im 40/40]
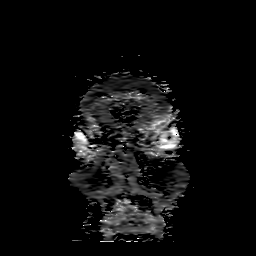

[35 of 48 positions shown; findings below may reference images not displayed]

FINDINGS: BRAIN: No reduced diffusion to suggest acute ischemia. No
susceptibility artifact to suggest hemorrhage. The ventricles and
sulci are normal for patient's age. Patchy supratentorial white
matter FLAIR T2 hyperintensities compatible with mild chronic small
vessel ischemic disease, normal for age. No suspicious parenchymal
signal, mass or mass effect. No abnormal extra-axial fluid
collections.

VASCULAR: Normal major intracranial vascular flow voids present at
skull base.

SKULL AND UPPER CERVICAL SPINE: No abnormal sellar expansion. No
suspicious calvarial bone marrow signal. Craniocervical junction
maintained.

SINUSES/ORBITS: Trace paranasal sinus mucosal thickening. Mastoid
air cells are well aerated. The included ocular globes and orbital
contents are non-suspicious.

OTHER: None.
IMPRESSION: Negative noncontrast MRI of the head for age.

## 2018-02-12 DIAGNOSIS — R7303 Prediabetes: Secondary | ICD-10-CM | POA: Diagnosis not present

## 2018-02-12 DIAGNOSIS — I2510 Atherosclerotic heart disease of native coronary artery without angina pectoris: Secondary | ICD-10-CM | POA: Diagnosis not present

## 2018-02-12 DIAGNOSIS — E782 Mixed hyperlipidemia: Secondary | ICD-10-CM | POA: Diagnosis not present

## 2018-02-19 DIAGNOSIS — R7303 Prediabetes: Secondary | ICD-10-CM | POA: Diagnosis not present

## 2018-02-19 DIAGNOSIS — E782 Mixed hyperlipidemia: Secondary | ICD-10-CM | POA: Diagnosis not present

## 2018-02-19 DIAGNOSIS — I2510 Atherosclerotic heart disease of native coronary artery without angina pectoris: Secondary | ICD-10-CM | POA: Diagnosis not present

## 2018-02-19 DIAGNOSIS — E876 Hypokalemia: Secondary | ICD-10-CM | POA: Diagnosis not present

## 2018-02-19 DIAGNOSIS — K117 Disturbances of salivary secretion: Secondary | ICD-10-CM | POA: Diagnosis not present

## 2018-03-17 ENCOUNTER — Emergency Department (HOSPITAL_COMMUNITY)
Admission: EM | Admit: 2018-03-17 | Discharge: 2018-03-17 | Disposition: A | Discharge status: Home / Self Care | Payer: Medicare Other | Attending: Emergency Medicine | Admitting: Emergency Medicine

## 2018-03-17 ENCOUNTER — Other Ambulatory Visit: Payer: Self-pay

## 2018-03-17 ENCOUNTER — Emergency Department (HOSPITAL_COMMUNITY): Payer: Medicare Other

## 2018-03-17 ENCOUNTER — Encounter (HOSPITAL_COMMUNITY): Payer: Self-pay | Admitting: Emergency Medicine

## 2018-03-17 DIAGNOSIS — R002 Palpitations: Secondary | ICD-10-CM | POA: Insufficient documentation

## 2018-03-17 DIAGNOSIS — Z7902 Long term (current) use of antithrombotics/antiplatelets: Secondary | ICD-10-CM | POA: Insufficient documentation

## 2018-03-17 DIAGNOSIS — J3089 Other allergic rhinitis: Secondary | ICD-10-CM | POA: Insufficient documentation

## 2018-03-17 DIAGNOSIS — R0602 Shortness of breath: Secondary | ICD-10-CM | POA: Diagnosis not present

## 2018-03-17 DIAGNOSIS — R079 Chest pain, unspecified: Secondary | ICD-10-CM | POA: Diagnosis present

## 2018-03-17 DIAGNOSIS — Z79899 Other long term (current) drug therapy: Secondary | ICD-10-CM | POA: Insufficient documentation

## 2018-03-17 LAB — BASIC METABOLIC PANEL
Anion gap: 11 (ref 5–15)
BUN: 17 mg/dL (ref 6–20)
CO2: 25 mmol/L (ref 22–32)
Calcium: 8.8 mg/dL — ABNORMAL LOW (ref 8.9–10.3)
Chloride: 102 mmol/L (ref 101–111)
Creatinine, Ser: 0.87 mg/dL (ref 0.61–1.24)
GFR calc Af Amer: >60 mL/min (ref >60)
GFR calc non Af Amer: >60 mL/min (ref >60)
Glucose, Bld: 126 mg/dL — ABNORMAL HIGH (ref 65–99)
Potassium: 2.8 mmol/L — ABNORMAL LOW (ref 3.5–5.1)
Sodium: 138 mmol/L (ref 135–145)

## 2018-03-17 LAB — CBC
HCT: 38.2 % — ABNORMAL LOW (ref 39.0–52.0)
Hemoglobin: 12.3 g/dL — ABNORMAL LOW (ref 13.0–17.0)
MCH: 29.5 pg (ref 26.0–34.0)
MCHC: 32.2 g/dL (ref 30.0–36.0)
MCV: 91.6 fL (ref 78.0–100.0)
Platelets: 253 10*3/uL (ref 150–400)
RBC: 4.17 MIL/uL — ABNORMAL LOW (ref 4.22–5.81)
RDW: 15.6 % — ABNORMAL HIGH (ref 11.5–15.5)
WBC: 9.9 10*3/uL (ref 4.0–10.5)

## 2018-03-17 LAB — I-STAT TROPONIN, ED: Troponin i, poc: 0.02 ng/mL (ref 0.00–0.08)

## 2018-03-17 MED ORDER — CETIRIZINE HCL 10 MG PO TABS: 10.0000 mg | ORAL_TABLET | Freq: Every day | ORAL | 0 refills | Status: DC

## 2018-03-17 MED ORDER — FLUTICASONE PROPIONATE 50 MCG/ACT NA SUSP: 2.0000 | Freq: Every day | NASAL | 0 refills | Status: DC

## 2018-03-17 MED ORDER — IPRATROPIUM-ALBUTEROL 0.5-2.5 (3) MG/3ML IN SOLN
3.0000 mL | Freq: Once | RESPIRATORY_TRACT | Status: AC
  Administered 2018-03-17: 3 mL via RESPIRATORY_TRACT
  Filled 2018-03-17: qty 3

## 2018-03-17 MED ORDER — ALBUTEROL SULFATE HFA 108 (90 BASE) MCG/ACT IN AERS
2.0000 | INHALATION_SPRAY | Freq: Four times a day (QID) | RESPIRATORY_TRACT | Status: DC | PRN
  Administered 2018-03-17: 2 via RESPIRATORY_TRACT
  Filled 2018-03-17: qty 6.7

## 2018-03-17 MED ORDER — ERYTHROMYCIN 5 MG/GM OP OINT: TOPICAL_OINTMENT | OPHTHALMIC | 0 refills | Status: DC

## 2018-03-17 NOTE — ED Triage Notes (Signed)
Pt. Stated, Im having some SOB , and some fast or irregular heart beat. Its been going on for about 6 months. I also have had some spit up stuff and its probably my sinuses.

## 2018-03-17 NOTE — ED Provider Notes (Signed)
[facility] EMERGENCY DEPARTMENT Provider Note   CSN: [id number] Arrival date & time: 03/17/18  1241     History   Chief Complaint Chief Complaint  Patient presents with  . Chest Pain  . Shortness of Breath    HPI Justin Davidson is a 72 y.o. male.  HPI Patient reports 1 of his biggest concerns presenting today is a chronic and ongoing problem with nasal congestion, drainage and stuffiness.  He reports he gets phlegm buildup in his throat that he has to cough and spit out.  He reports he is always draining from his nose in the back of the throat.  This is been extremely bothersome to him for a long time.  He does report he seen ENT in the past and told it was not a problem with his sinuses.  Other main reason patient is here today is because this morning he told his wife that she should come back from church quickly after it was over because he did not feel good and felt like his heart was skipping beats or racing.  When he said that, she brought him to the emergency department instead.  He denies he is having any chest pain.  He has been having some exertional dyspnea for 6 months to a year.  Over about the past few months his wife is noting that by the time he gets to the top of their stairs to go to the bedroom that he has to take a break and rest.  He has not had fevers or chills.  Phlegm that he coughs up is always clear. Past Medical History:  Diagnosis Date  . Hypercholesteremia     There are no active problems to display for this patient.   Past Surgical History:  Procedure Laterality Date  . cardaic stents          Home Medications    Prior to Admission medications   Medication Sig Start Date End Date Taking? Authorizing Provider  aspirin EC 81 MG tablet Take 81 mg by mouth daily.    [provider]  atorvastatin (LIPITOR) 80 MG tablet Take 80 mg by mouth daily. 07/16/17   [provider]  cetirizine (ZYRTEC ALLERGY) 10 MG tablet  Take 1 tablet (10 mg total) by mouth daily. 03/17/18   Charlesetta Shanks, MD  clopidogrel (PLAVIX) 75 MG tablet Take 75 mg by mouth daily. 06/22/17   [provider]  diclofenac (CATAFLAM) 50 MG tablet Take 50 mg by mouth 2 (two) times daily. 07/13/17   [provider]  donepezil (ARICEPT) 23 MG TABS tablet Take 23 mg by mouth daily.    [provider]  erythromycin ophthalmic ointment Place a 1/2 inch ribbon of ointment into the lower eyelid.  3 times daily for 5 days. 03/17/18   Charlesetta Shanks, MD  fluticasone (FLONASE) 50 MCG/ACT nasal spray Place 2 sprays into both nostrils daily. 03/17/18   Charlesetta Shanks, MD  losartan-hydrochlorothiazide (HYZAAR) 100-12.5 MG tablet Take 1 tablet by mouth every morning. 06/22/17   [provider]  meclizine (ANTIVERT) 25 MG tablet Take 1 tablet (25 mg total) by mouth 3 (three) times daily as needed for dizziness. 09/06/17   Charisse March, MD  metoprolol tartrate (LOPRESSOR) 50 MG tablet Take 50 mg by mouth 2 (two) times daily. 06/22/17   [provider]    Family History No family history on file.  Social History Social History   Tobacco Use  . Smoking status: Never  Smoker  . Smokeless tobacco: Never Used  Substance Use Topics  . Alcohol use: Not Currently  . Drug use: Not Currently     Allergies   Patient has no known allergies.   Review of Systems Review of Systems 10 Systems reviewed and are negative for acute change except as noted in the HPI.   Physical Exam Updated Vital Signs BP (!) 120/58 (BP Location: Left Arm)   Pulse 73   Temp 99 F (37.2 C) (Oral)   Resp 17   Ht 5\' 11"  (1.803 m)   Wt (!) 138.3 kg (305 lb)   SpO2 98%   BMI 42.54 kg/m   Physical Exam  Constitutional: He is oriented to person, place, and time.  Patient is alert and nontoxic.  No respiratory distress at rest.  HENT:  Bilateral TMs normal.  Nares have some bogginess to the mucosa.  Her oropharynx widely patent.  No  postnasal drip.  Eyes:  Slight injection bilateral eyes.  smAll amount of mucopurulent appearing drainage from the left eye.  Neck: Neck supple.  Cardiovascular: Normal rate, regular rhythm, normal heart sounds and intact distal pulses.  Pulmonary/Chest: Effort normal and breath sounds normal.  Abdominal: Soft. He exhibits no distension. There is no tenderness. There is no guarding.  Musculoskeletal: Normal range of motion. He exhibits no edema or tenderness.  No peripheral edema.  Calves are soft and nontender.  Skin condition excellent.  Normal hair growth.  No signs of chronic venous stasis.  Neurological: He is alert and oriented to person, place, and time. No cranial nerve deficit. He exhibits normal muscle tone. Coordination normal.  Skin: Skin is warm and dry.  Psychiatric: He has a normal mood and affect.     ED Treatments / Results  Labs (all labs ordered are listed, but only abnormal results are displayed) Labs Reviewed  BASIC METABOLIC PANEL - Abnormal; Notable for the following components:      Result Value   Potassium 2.8 (*)    Glucose, Bld 126 (*)    Calcium 8.8 (*)    All other components within normal limits  CBC - Abnormal; Notable for the following components:   RBC 4.17 (*)    Hemoglobin 12.3 (*)    HCT 38.2 (*)    RDW 15.6 (*)    All other components within normal limits  I-STAT TROPONIN, ED    EKG EKG Interpretation  Date/Time:  Sunday March 17 2018 12:49:22 EDT Ventricular Rate:  77 PR Interval:  160 QRS Duration: 84 QT Interval:  382 QTC Calculation: 432 R Axis:   67 Text Interpretation:  Normal sinus rhythm Normal ECG agree. no chabge from [facility] by Charlesetta Shanks [phone]) on 03/17/2018 5:13:37 PM   Radiology Dg Chest 2 View  Result Date: 03/17/2018 CLINICAL DATA:  Shortness of breath.  Irregular heart beat. EXAM: CHEST - 2 VIEW COMPARISON:  September 06, 2017 FINDINGS: The heart size is borderline to mildly enlarged. The hila and  mediastinum are normal. No pulmonary nodules or masses. Mild bibasilar atelectasis. No suspicious infiltrate. IMPRESSION: No active cardiopulmonary disease. Electronically Signed   By: Dorise Bullion III M.D   On: 03/17/2018 13:29    Procedures Procedures (including critical care time)  Medications Ordered in ED Medications  ipratropium-albuterol (DUONEB) 0.5-2.5 (3) MG/3ML nebulizer solution 3 mL (has no administration in time range)  albuterol (PROVENTIL HFA;VENTOLIN HFA) 108 (90 Base) MCG/ACT inhaler 2 puff (has no administration in time range)  Initial Impression / Assessment and Plan / ED Course  I have reviewed the triage vital signs and the nursing notes.  Pertinent labs & imaging results that were available during my care of the patient were reviewed by me and considered in my medical decision making (see chart for details).      Final Clinical Impressions(s) / ED Diagnoses   Final diagnoses:  Non-seasonal allergic rhinitis, unspecified trigger  Palpitations   Regarding patient's chronic and recurrent nasal discharge, white phlegm in his throat and perceived stuffiness, will try combination of Zyrtec, Flonase, albuterol.  Patient also has a little mucopurulent discharge from the eyes.  Will add erythromycin ophthalmic.  Patient reports palpitations that he described to his wife today.  Patient has had sinus rhythm in the emergency department.  No associated chest pain.  No syncope or near syncope.  He has been getting chronically dyspneic over what actually sounds like several years.  Patient does have physical deconditioning and obesity but he does not have any signs of peripheral edema or vascular congestion.  At this time, advise follow-up with cardiology.  Patient already has a scheduled appointment this week.  Return precautions reviewed. ED Discharge Orders        Ordered    cetirizine (ZYRTEC ALLERGY) 10 MG tablet  Daily     03/17/18 1831    fluticasone (FLONASE)  50 MCG/ACT nasal spray  Daily     03/17/18 1831    erythromycin ophthalmic ointment     03/17/18 1831       Charlesetta Shanks, MD 03/17/18 1840

## 2018-03-17 NOTE — Discharge Instructions (Signed)
1.  Follow-up with your family doctor this week.  Discussed referral to an allergist.  Also discussed placement of a Holter monitor or referral to cardiology for further evaluation of palpitations. 2.  Return to the emergency department if her symptoms are worsening or changing. 3.  Try to avoid all caffeine and other items listed in your instructions for palpitations.

## 2018-03-21 DIAGNOSIS — R05 Cough: Secondary | ICD-10-CM | POA: Diagnosis not present

## 2018-03-21 DIAGNOSIS — R062 Wheezing: Secondary | ICD-10-CM | POA: Diagnosis not present

## 2018-03-21 DIAGNOSIS — K117 Disturbances of salivary secretion: Secondary | ICD-10-CM | POA: Diagnosis not present

## 2018-03-21 DIAGNOSIS — I2510 Atherosclerotic heart disease of native coronary artery without angina pectoris: Secondary | ICD-10-CM | POA: Diagnosis not present

## 2018-04-02 DIAGNOSIS — G4733 Obstructive sleep apnea (adult) (pediatric): Secondary | ICD-10-CM | POA: Diagnosis not present

## 2018-04-02 DIAGNOSIS — R0602 Shortness of breath: Secondary | ICD-10-CM | POA: Diagnosis not present

## 2018-04-02 DIAGNOSIS — I2510 Atherosclerotic heart disease of native coronary artery without angina pectoris: Secondary | ICD-10-CM | POA: Diagnosis not present

## 2018-04-02 DIAGNOSIS — I10 Essential (primary) hypertension: Secondary | ICD-10-CM | POA: Diagnosis not present

## 2018-04-12 DIAGNOSIS — R0602 Shortness of breath: Secondary | ICD-10-CM | POA: Diagnosis not present

## 2018-04-12 DIAGNOSIS — I2510 Atherosclerotic heart disease of native coronary artery without angina pectoris: Secondary | ICD-10-CM | POA: Diagnosis not present

## 2018-04-25 DIAGNOSIS — R0602 Shortness of breath: Secondary | ICD-10-CM | POA: Diagnosis not present

## 2018-04-25 DIAGNOSIS — I2510 Atherosclerotic heart disease of native coronary artery without angina pectoris: Secondary | ICD-10-CM | POA: Diagnosis not present

## 2018-04-25 DIAGNOSIS — G4733 Obstructive sleep apnea (adult) (pediatric): Secondary | ICD-10-CM | POA: Diagnosis not present

## 2018-04-25 DIAGNOSIS — I10 Essential (primary) hypertension: Secondary | ICD-10-CM | POA: Diagnosis not present

## 2018-05-21 ENCOUNTER — Encounter: Payer: Self-pay | Admitting: Neurology

## 2018-05-22 ENCOUNTER — Ambulatory Visit: Payer: Medicare Other | Admitting: Neurology

## 2018-05-22 ENCOUNTER — Encounter: Payer: Self-pay | Admitting: Neurology

## 2018-05-22 VITALS — BP 120/73 | HR 49 | Ht 71.0 in | Wt 305.0 lb

## 2018-05-22 DIAGNOSIS — R05 Cough: Secondary | ICD-10-CM

## 2018-05-22 DIAGNOSIS — E662 Morbid (severe) obesity with alveolar hypoventilation: Secondary | ICD-10-CM | POA: Diagnosis not present

## 2018-05-22 DIAGNOSIS — R058 Other specified cough: Secondary | ICD-10-CM

## 2018-05-22 DIAGNOSIS — R0602 Shortness of breath: Secondary | ICD-10-CM | POA: Diagnosis not present

## 2018-05-22 DIAGNOSIS — G4719 Other hypersomnia: Secondary | ICD-10-CM

## 2018-05-22 NOTE — Progress Notes (Signed)
SLEEP MEDICINE CLINIC   Provider:  Larey Seat, [place] D  Primary Care Physician:  Dr. Ashby Dawes   Referring Provider: Adrian Prows, MD   Chief Complaint  Patient presents with  . New Patient (Initial Visit)    pt with wife. pt has trouble with sleeping at night but wife states that he nods off all the time. sleep study completed years ago which confirmed sleep apnea but he never followed up with treatment. pt wife states he snores in sleep    HPI:  Justin Davidson is a 72 y.o. male patient , seen here  in a referral from Dr. Einar Gip for an evaluation of possible sleep apnea.   Chief complaint according to patient's  wife  "he is short of breath just leaving his car, or climbing stairs". The patient denies being short of breath while walking without incline. His wife stated he is not able to just walk from the parking lot into this office without SOB.  He has a lot of mucous , coughing, and his nose in running. He snores loudly and wakes with a dry mouth .   Sleep habits are as follows: he still works part time- comes home at 7 Pm and has supper, eats and goes to [employer], watches TV and is asleep while doing so. He snores loudly, and after 2 hours goes to the marital bedroom - and lets the TV run.  He sleeps on his side, the left mostly. His wife reports he is mostly supine, sleeps on multiple pillows, has orthopnea! He wakes and needs to urinate 2-3 times each night.  Rises at  7 AM , spontanuously.    Sleep medical history and family sleep history:  No family history of OSA- Obesity hypoventilation , OSA diagnosed, dyspnea, hypersomnia at age 53 young.  Can't remember last PSG , probably 10-12 years ago.   Social history: married, he drinks caffeinated sodas ( 2-3 a day ) at his office. Iced tea now rarely , one coffee a day. 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 No exercise. 3 adult children, 40 daughter, son 109, son 78. One grandson.    Medical chart review: Mr.  Megan Salon reports that he has a lot of mucus congestion, and that he eliminates and spits mucus out all night.   He also has been tested by cardiac catheter which showed no abnormal ejection fraction, recent lab step studies and Dr. Irven Shelling office showed normal cholesterol at 145 triglycerides 113 HDL 34 a little low LDL 88, serum glucose 110, creatinine 0.82, potassium 3.0.  CBC was normal.  Excess exercise Myoview stress test was performed in 2012 at the time some diaphragmatic attenuation was noted his EF there was 75%.  Cardiac stress test were normal.  No abnormal EKG response recently.  On 12 Apr 2018 he was scheduled for myocardial perfusion imaging.  I do not have the results yet, diagnosis was morbid obesity with alveolar hypoventilation, also known as obesity hypoventilation, shortness of breath, essential hypertension, coronary artery disease involving the native coronary artery of the native heart without angina pectoris, HbA1c was 6.0.   Review of Systems: Out of a complete 14 system review, the patient complains of only the following symptoms, and all other reviewed systems are negative.  Snoring- loudly, thunderous, irregular breathing, shallow breathing, tachypnea, huffing.  Epworth score  15/ 24  , Fatigue severity score 32 points   , depression score n/a    Social History  Socioeconomic History  . Marital status: Married    Spouse name: Not on file  . Number of children: Not on file  . Years of education: Not on file  . Highest education level: Not on file  Occupational History  . Not on file  Social Needs  . Financial resource strain: Not on file  . Food insecurity:    Worry: Not on file    Inability: Not on file  . Transportation needs:    Medical: Not on file    Non-medical: Not on file  Tobacco Use  . Smoking status: Never Smoker  . Smokeless tobacco: Never Used  Substance and Sexual Activity  . Alcohol use: Yes  . Drug use: Not Currently  . Sexual activity: Not  on file  Lifestyle  . Physical activity:    Days per week: Not on file    Minutes per session: Not on file  . Stress: Not on file  Relationships  . Social connections:    Talks on phone: Not on file    Gets together: Not on file    Attends religious service: Not on file    Active member of club or organization: Not on file    Attends meetings of clubs or organizations: Not on file    Relationship status: Not on file  . Intimate partner violence:    Fear of current or ex partner: Not on file    Emotionally abused: Not on file    Physically abused: Not on file    Forced sexual activity: Not on file  Other Topics Concern  . Not on file  Social History Narrative  . Not on file    Family History  Problem Relation Age of Onset  . Stroke Mother   . Hypertension Father   . Hyperlipidemia Father   . Hypertension Sister   . Hyperlipidemia Sister     Past Medical History:  Diagnosis Date  . Coronary artery disease   . Dizziness and giddiness   . Hypercholesteremia   . Hyperglycemia   . Hypertension   . OSA (obstructive sleep apnea)     Past Surgical History:  Procedure Laterality Date  . cardaic stents    . CARDIAC CATHETERIZATION    . PARTIAL COLECTOMY  1996   colon cancer    Current Outpatient Medications  Medication Sig Dispense Refill  . aspirin EC 81 MG tablet Take 81 mg by mouth daily.    Marland Kitchen atorvastatin (LIPITOR) 80 MG tablet Take 80 mg by mouth daily.  0  . BISACODYL EC PO Take 5 mg by mouth as needed (for constipation).    . clopidogrel (PLAVIX) 75 MG tablet Take 75 mg by mouth daily.  0  . donepezil (ARICEPT) 23 MG TABS tablet Take by mouth.    . fluticasone (FLONASE) 50 MCG/ACT nasal spray Place 2 sprays into both nostrils daily. 16 g 0  . loratadine (CLARITIN) 10 MG tablet Take 10 mg by mouth daily.    Marland Kitchen losartan-hydrochlorothiazide (HYZAAR) 100-12.5 MG tablet Take 1 tablet by mouth every morning.  0  . metoprolol tartrate (LOPRESSOR) 50 MG tablet Take 50  mg by mouth 2 (two) times daily.  0  . potassium chloride (MICRO-K) 10 MEQ CR capsule TK 1 C PO QD  5  . Psyllium (METAMUCIL FIBER PO) Take by mouth.     No current facility-administered medications for this visit.     Allergies as of 05/22/2018 - Review Complete 05/22/2018  Allergen Reaction  Noted  . Lisinopril Cough 05/21/2018    Vitals: BP 120/73   Pulse (!) 49   Ht 5\' 11"  (1.803 m)   Wt (!) 305 lb (138.3 kg)   BMI 42.54 kg/m  Last Weight:  Wt Readings from Last 1 Encounters:  05/22/18 (!) 305 lb (138.3 kg)   KNL:ZJQB mass index is 42.54 kg/m.     Last Height:   Ht Readings from Last 1 Encounters:  05/22/18 5\' 11"  (1.803 m)    Physical exam:  General: The patient is awake, alert and appears not in acute distress. The patient is well groomed. Head: Normocephalic, atraumatic. Neck is supple. Mallampati 5,  neck circumference:20. Nasal airflow - patent , but runny nose , clear discharge. Retrognathia is mildlyseen.  Cardiovascular:  Regular rate and rhythm , without  murmurs or carotid bruit, and without distended neck veins. Respiratory: Lungs are clear to auscultation. Skin:  Without evidence of edema, or rash Trunk: BMI is 43,  The patient's posture is erect.   Neurologic exam : The patient is awake and alert, oriented to place and time.   Memory subjective described as mildly impaired.  Attention span & concentration ability appears normal.  Speech is fluent,  without dysarthria, dysphonia or aphasia.  Mood and affect are appropriate.  Cranial nerves: Pupils are equal and briskly reactive to light. Funduscopic exam deferred.  Extraocular movements  in vertical and horizontal planes intact and without nystagmus. Visual fields by finger perimetry are intact. Hearing to finger rub impaired - Facial sensation intact to fine touch.Facial motor strength is symmetric and tongue and uvula move midline. Shoulder shrug was symmetrical.   Motor exam: Normal tone, muscle bulk  and symmetric strength in all extremities. Sensory:  Fine touch, pinprick and vibration decreased at ankles. Coordination: Finger-to-nose maneuver  normal without evidence of ataxia, dysmetria or tremor.  Has gegenhalten while I test ROM.  Gait and station: Patient walks without assistive device .Tandem gait deferred. Turns with 4  Steps. Romberg testing is negative. Deep tendon reflexes: in the upper and lower extremities are attenuated -. Babinski maneuver response is equivocal.    Assessment:  After physical and neurologic examination, review of laboratory studies,  Personal review of imaging studies, reports of other /same  Imaging studies, results of polysomnography and / or neurophysiology testing and pre-existing records as far as provided in visit., my assessment is   1)  He has risk factors for OSA- was diagnosed about a decade ago - was not willing or able to use CPAP. Now, with progressive SOB, and mucous congestion, he may be even harder to accept  CPAP. He has many co-morbidities , including  Obesity hypoventilation, that could make CPAP and oxygen treatment necessary.    SPLIT study at AHI 20, if he needs oxygen- titrate it for him.   The patient was advised of the nature of the diagnosed disorder , the treatment options and the  risks for general health and wellness arising from not treating the condition.   I spent more than 50 minutes of face to face time with the patient.  Greater than 50% of time was spent in counseling and coordination of care. We have discussed the diagnosis and differential and I answered the patient's questions.    Plan:  Treatment plan and additional workup :  Rv with me.   Larey Seat, MD 3/41/9379, 02:40 PM  Certified in Neurology by ABPN Certified in [place] by [facility] [id number]  [address] [place], [place] [address]

## 2018-06-16 ENCOUNTER — Ambulatory Visit (INDEPENDENT_AMBULATORY_CARE_PROVIDER_SITE_OTHER): Payer: Medicare Other | Admitting: Neurology

## 2018-06-16 DIAGNOSIS — R0602 Shortness of breath: Secondary | ICD-10-CM

## 2018-06-16 DIAGNOSIS — E662 Morbid (severe) obesity with alveolar hypoventilation: Secondary | ICD-10-CM

## 2018-06-16 DIAGNOSIS — R058 Other specified cough: Secondary | ICD-10-CM

## 2018-06-16 DIAGNOSIS — G4719 Other hypersomnia: Secondary | ICD-10-CM

## 2018-06-16 DIAGNOSIS — G4733 Obstructive sleep apnea (adult) (pediatric): Secondary | ICD-10-CM

## 2018-06-16 DIAGNOSIS — R05 Cough: Secondary | ICD-10-CM

## 2018-06-28 NOTE — Procedures (Signed)
PATIENT'S NAME:  Justin Davidson, Justin Davidson DOB:      1946-08-10      MR#:    [id number]     DATE OF RECORDING: 06/16/2018 REFERRING M.D.:  Adrian Prows, MD Study Performed:  Split-Night Titration Study HISTORY:  Mr. Chagoya is a 72 year old patient of Dr.Ganji's whose wife is concerned about him sleeping so much in daytime, stating: "he sleeps all the time, he is short of breath just leaving his car, or climbing stairs". The patient denies being short of breath. He has a lot of airway mucous causing him frequently to cough, and his nose is running. "He snores loudly, goes 2-3 times to the bathroom at night and wakes with a dry mouth". 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   The patient endorsed the Epworth Sleepiness Scale at 15/24 points, FSS at 32 points   The patient's weight 305 pounds with a height of 71 (inches), resulting in a BMI of 42.6 kg/m2. The patient's neck circumference measured 20 inches.  CURRENT MEDICATIONS: Aspirin, Atorvastatin, Bisacodyl, Clopidogrel, Donepezil, Fluticasone, Loratadine, Losartan-Hydrochlorothiazide, Metoprolol Tartrate, Potassium Chloride and Psyllium.   PROCEDURE:  This is a multichannel digital polysomnogram utilizing the Somnostar 11.2 system.  Electrodes and sensors were applied and monitored per AASM Specifications.   EEG, EOG, Chin and Limb EMG, were sampled at 200 Hz.  ECG, Snore and Nasal Pressure, Thermal Airflow, Respiratory Effort, CPAP Flow and Pressure, Oximetry was sampled at 50 Hz. Digital video and audio were recorded.      BASELINE STUDY WITHOUT CPAP RESULTS:  Lights Out was at 22:29 and Lights On at 05:27.  Total recording time (TRT) was 219.5, with a total sleep time (TST) of 184 minutes. The patient's sleep latency was 10.5 minutes.  REM latency was 74 minutes.  The sleep efficiency was 83.8 %.    SLEEP ARCHITECTURE: WASO (Wake after sleep onset) was 24 minutes, Stage N1 was 15.5  minutes, Stage N2 was 133 minutes, Stage N3 was 0 minutes and Stage R (REM sleep) was 35.5 minutes.  The percentages were Stage N1 8.4%, Stage N2 72.3%, Stage N3 0% and Stage R (REM sleep) 19.3%.   RESPIRATORY ANALYSIS:  There were a total of 193 respiratory events:  75 obstructive apneas, 0 central apneas and 118 hypopneas with only 1 additional respiratory event related arousal (RERAs) through snoring.     The total APNEA/HYPOPNEA INDEX (AHI) was 62.9 /hour and the total RESPIRATORY DISTURBANCE INDEX was 63.3 /hour.  49 events occurred in REM sleep and 180 events in NREM. The REM AHI was 82.8, /hour versus a non-REM AHI of 58.2 /hour. The patient spent 159 minutes sleep time in the supine position 184 minutes in non-supine. The supine AHI was 80.3 /hour versus a non-supine AHI of 54.7 /hour.  OXYGEN SATURATION & C02:  The wake baseline 02 saturation was 82%, with the lowest being 70%. Time spent below 89% saturation equaled 144 minutes. End Tidal CO2 during sleep was 42.9 torr.    PERIODIC LIMB MOVEMENTS: The patient had a total of 0 Periodic Limb Movements.  The arousals were noted as: 4 were spontaneous, 0 were associated with PLMs, and 196 were associated with respiratory events. Snoring was noted. 1 nocturia prior to CPAP placement. EKG in sinus rhythm (NSR).   TITRATION STUDY WITH CPAP RESULTS: CPAP was initiated at 5 cmH20 with heated humidity per AASM split night standards and pressure was advanced to 15/15  cmH20 because of hypopneas, apneas and desaturations.  At a PAP pressure of 14 and 15 cmH20, there was a reduction of the AHI to 0.0 /hour.   Total recording time (TRT) was 199.5 minutes, with a total sleep time (TST) of 158.5 minutes. The patient's sleep latency was 11 minutes. REM latency was 11 minutes.  The sleep efficiency was 79.4 %.   SLEEP ARCHITECTURE: Wake after sleep was 32 minutes, Stage N1 10.5 minutes, Stage N2 97 minutes, Stage N3 0 minutes and Stage R (REM sleep) 51 minutes.  The percentages were: Stage N1 6.6%, Stage N2 61.2%, Stage N3 0% and Stage R (REM sleep) 32.2%.   RESPIRATORY ANALYSIS:  There were a total of 84 respiratory events: 0 obstructive apneas, 0 central apneas and 0 mixed apneas with a total of 0 apneas and an apnea index (AI) of 0. There were 84 hypopneas with a hypopnea index of 31.8 /hour. The patient also had 0 respiratory event related arousals (RERAs).      The total APNEA/HYPOPNEA INDEX (AHI) was 31.8 /hour and the total RESPIRATORY DISTURBANCE INDEX was 31.8 /hour.  7 events occurred in REM sleep and 77 events in NREM. The REM AHI was 8.2 /hour versus a non-REM AHI of 43. /hour. The patient spent 63% of total sleep time in the supine position. The supine AHI was 45.0 /hour, versus a non-supine AHI of 9.2/hour.  OXYGEN SATURATION & C02:  The wake baseline 02 saturation was 92%, with the lowest being 81%. Time spent below 89% saturation equaled 39 minutes.  PERIODIC LIMB MOVEMENTS:   The patient had a total of 0 Periodic Limb Movements. The arousals were noted as: 18 were spontaneous, 0 were associated with PLMs, and 84 were associated with respiratory events. Post-study, the patient indicated that sleep was sounder and more restorative than usual. The patient was fitted with a large nasal pillow P 10.  POLYSOMNOGRAPHY IMPRESSION :   1. Severe Obstructive Sleep Apnea (OSA) at AHI 62.9 and accentuated by REM sleep (AHI was 82.8/h) and supine sleep to AHI 80.3 /h. 2. Prolonged total desaturation time of 144 minutes with SpO2 nadir of 70%.  3. Snoring 4. OSA responded well to CPAP pressures above 10 cm water, but did not correct hypoxemia as well.   5. RECOMMENDATIONS: The preferred CPAP pressures of 14 and 15 cm water were difficult to achieve using a nasal pillow and the patient may need a different interface. I order an auto titration CPAP device at 7 cm water pressure through 18 cm water with 3 cm EPR. Heated humidity to be applied.   6. An  overnight pulse-oximetry should be performed after the patiet has used CPAP for about 1 month.    Please instruct the patient that CPAP therapy compliance is defined as 4 hours or more of nightly device use.  If problems with compliance arise, he is to inform his DME immediately- meaning that discomfort under CPAP use, logistical or technical problems with CPAP  are to be addressed without sacrificing Compliance.    A follow up appointment will be scheduled in the Sleep Clinic at [facility] between 60-90 days of therapy.      I certify that I have reviewed the entire raw data recording prior to the issuance of this report in accordance with the Standards of Accreditation of the American Academy of Sleep Medicine (AASM)    Larey Seat, M.D. 06-28-2018  Diplomat, American Board of Psychiatry and Neurology  [place]  of Sleep Medicine Medical Director, [facility] at [facility]

## 2018-06-28 NOTE — Addendum Note (Signed)
Addended by: Larey Seat on: 06/28/2018 02:19 PM   Modules accepted: Orders

## 2018-07-01 ENCOUNTER — Telehealth: Payer: Self-pay | Admitting: Neurology

## 2018-07-01 NOTE — Telephone Encounter (Signed)
-----   Message from Larey Seat, MD sent at 06/28/2018  2:19 PM EDT ----- 1. Severe Obstructive Sleep Apnea (OSA) at AHI 62.9 and  accentuated by REM sleep (AHI was 82.8/h) and supine sleep to AHI  80.3 /h. 2. Prolonged total desaturation time of 144 minutes with SpO2  nadir of 70%.  3. Snoring 4. OSA responded well to CPAP pressures above 10 cm water, but  did not correct hypoxemia as well.  5. RECOMMENDATIONS: The preferred CPAP pressures of 14 and 15 cm  water were difficult to achieve using a nasal pillow and the  patient may need a different interface. I order an auto titration  CPAP device at 7 cm water pressure through 18 cm water with 3 cm  EPR. Heated humidity to be applied.  6. An overnight pulse-oximetry should be performed after the  patiet has used CPAP for about 1 month.   Please instruct the patient that CPAP therapy compliance is  defined as 4 hours or more of nightly device use.  If problems with compliance arise, he is to inform his DME  immediately- meaning that discomfort under CPAP use, logistical  or technical problems with CPAP are to be addressed without  sacrificing Compliance.

## 2018-07-01 NOTE — Telephone Encounter (Signed)
I called Justin Davidson. I advised Justin Davidson that Dr. Brett Fairy reviewed their sleep study results and found that Justin Davidson has sleep apnea. Dr. Brett Fairy recommends that Justin Davidson starts auto CPAP. I reviewed PAP compliance expectations with the Justin Davidson. Justin Davidson is agreeable to starting a CPAP. I advised Justin Davidson that an order will be sent to a DME, [facility], and [facility] will call the Justin Davidson within about one week after they file with the Justin Davidson's insurance. [facility] will show the Justin Davidson how to use the machine, fit for masks, and troubleshoot the CPAP if needed. A follow up appt was made for insurance purposes with Dr. Brett Fairy on Oct 03, 2018 at 10:30 am. Justin Davidson verbalized understanding to arrive 15 minutes early and bring their CPAP. A letter with all of this information in it will be mailed to the Justin Davidson as a reminder. I verified with the Justin Davidson that the address we have on file is correct. Justin Davidson verbalized understanding of results. Justin Davidson had no questions at this time but was encouraged to call back if questions arise.

## 2018-07-02 DIAGNOSIS — E782 Mixed hyperlipidemia: Secondary | ICD-10-CM | POA: Diagnosis not present

## 2018-07-02 DIAGNOSIS — I2510 Atherosclerotic heart disease of native coronary artery without angina pectoris: Secondary | ICD-10-CM | POA: Diagnosis not present

## 2018-07-02 DIAGNOSIS — Z23 Encounter for immunization: Secondary | ICD-10-CM | POA: Diagnosis not present

## 2018-07-02 DIAGNOSIS — Z0000 Encounter for general adult medical examination without abnormal findings: Secondary | ICD-10-CM | POA: Diagnosis not present

## 2018-07-02 DIAGNOSIS — R7303 Prediabetes: Secondary | ICD-10-CM | POA: Diagnosis not present

## 2018-07-09 DIAGNOSIS — Z0000 Encounter for general adult medical examination without abnormal findings: Secondary | ICD-10-CM | POA: Diagnosis not present

## 2018-07-09 DIAGNOSIS — R7303 Prediabetes: Secondary | ICD-10-CM | POA: Diagnosis not present

## 2018-07-09 DIAGNOSIS — N401 Enlarged prostate with lower urinary tract symptoms: Secondary | ICD-10-CM | POA: Diagnosis not present

## 2018-07-18 DIAGNOSIS — G4733 Obstructive sleep apnea (adult) (pediatric): Secondary | ICD-10-CM | POA: Diagnosis not present

## 2018-08-18 DIAGNOSIS — G4733 Obstructive sleep apnea (adult) (pediatric): Secondary | ICD-10-CM | POA: Diagnosis not present

## 2018-08-28 DIAGNOSIS — R05 Cough: Secondary | ICD-10-CM | POA: Diagnosis not present

## 2018-09-05 ENCOUNTER — Telehealth: Payer: Self-pay | Admitting: Neurology

## 2018-09-05 NOTE — Telephone Encounter (Signed)
Called the patient to make him aware that we would move him onto the NP schedule same date and time. 10/03/18 at 10:30 am. Pt verbalized understanding and was ok with that change.

## 2018-09-10 ENCOUNTER — Encounter: Payer: Self-pay | Admitting: Internal Medicine

## 2018-09-10 ENCOUNTER — Other Ambulatory Visit (INDEPENDENT_AMBULATORY_CARE_PROVIDER_SITE_OTHER): Payer: Medicare Other

## 2018-09-10 ENCOUNTER — Ambulatory Visit (INDEPENDENT_AMBULATORY_CARE_PROVIDER_SITE_OTHER): Payer: Medicare Other | Admitting: Internal Medicine

## 2018-09-10 VITALS — BP 128/76 | HR 60 | Ht 71.0 in | Wt 314.0 lb

## 2018-09-10 DIAGNOSIS — I10 Essential (primary) hypertension: Secondary | ICD-10-CM | POA: Diagnosis not present

## 2018-09-10 DIAGNOSIS — R05 Cough: Secondary | ICD-10-CM

## 2018-09-10 DIAGNOSIS — R058 Other specified cough: Secondary | ICD-10-CM

## 2018-09-10 DIAGNOSIS — R0609 Other forms of dyspnea: Secondary | ICD-10-CM | POA: Diagnosis not present

## 2018-09-10 DIAGNOSIS — R059 Cough, unspecified: Secondary | ICD-10-CM

## 2018-09-10 DIAGNOSIS — R0600 Dyspnea, unspecified: Secondary | ICD-10-CM

## 2018-09-10 LAB — CBC WITH DIFFERENTIAL/PLATELET
Basophils Absolute: 0 10*3/uL (ref 0.0–0.1)
Basophils Relative: 0.4 % (ref 0.0–3.0)
Eosinophils Absolute: 0.3 10*3/uL (ref 0.0–0.7)
Eosinophils Relative: 3.3 % (ref 0.0–5.0)
HCT: 42.9 % (ref 39.0–52.0)
Hemoglobin: 14.3 g/dL (ref 13.0–17.0)
Lymphocytes Relative: 23.6 % (ref 12.0–46.0)
Lymphs Abs: 2.2 10*3/uL (ref 0.7–4.0)
MCHC: 33.2 g/dL (ref 30.0–36.0)
MCV: 89.7 fl (ref 78.0–100.0)
Monocytes Absolute: 0.9 10*3/uL (ref 0.1–1.0)
Monocytes Relative: 10.3 % (ref 3.0–12.0)
Neutro Abs: 5.7 10*3/uL (ref 1.4–7.7)
Neutrophils Relative %: 62.4 % (ref 43.0–77.0)
Platelets: 239 10*3/uL (ref 150.0–400.0)
RBC: 4.79 Mil/uL (ref 4.22–5.81)
RDW: 15.9 % — ABNORMAL HIGH (ref 11.5–15.5)
WBC: 9.1 10*3/uL (ref 4.0–10.5)

## 2018-09-10 MED ORDER — TELMISARTAN-HCTZ 80-25 MG PO TABS: 1.0000 | ORAL_TABLET | Freq: Every day | ORAL | 2 refills | Status: DC

## 2018-09-10 MED ORDER — FAMOTIDINE 20 MG PO TABS: ORAL_TABLET | ORAL | 11 refills | Status: DC

## 2018-09-10 MED ORDER — PANTOPRAZOLE SODIUM 40 MG PO TBEC: 40.0000 mg | DELAYED_RELEASE_TABLET | Freq: Every day | ORAL | 2 refills | Status: DC

## 2018-09-10 NOTE — Patient Instructions (Addendum)
For drainage / throat tickle try take CHLORPHENIRAMINE  4 mg - take one every 4 hours as needed - available over the counter- may cause drowsiness so start with just a  dose or two one hour before bedtime and see how you tolerate it before trying in daytime     Pantoprazole (protonix) 40 mg   Take  30-60 min before first meal of the day and Pepcid (famotidine)  20 mg one hour before bedtime until return to office - this is the best way to tell whether stomach acid is contributing to your problem.    Stop losartan and start micardis 80-12.5 one daily in its place    Please see patient coordinator before you leave today  to schedule sinus CT    GERD (REFLUX)  is an extremely common cause of respiratory symptoms just like yours , many times with no obvious heartburn at all.    It can be treated with medication, but also with lifestyle changes including elevation of the head of your bed (ideally with 6 inch  bed blocks),  Smoking cessation, avoidance of late meals, excessive alcohol, and avoid fatty foods, chocolate, peppermint, colas, red wine, and acidic juices such as orange juice.  NO MINT OR MENTHOL PRODUCTS SO NO COUGH DROPS   USE SUGARLESS CANDY INSTEAD (Jolley ranchers or Stover's or Life Savers) or even ice chips will also do - the key is to swallow to prevent all throat clearing. NO OIL BASED VITAMINS - use powdered substitutes.   Please remember to go to the lab department downstairs in the basement  for your tests - we will call you with the results when they are available.       Please schedule a follow up office visit in 4 weeks, sooner if needed  with all medications /inhalers/ solutions in hand so we can verify exactly what you are taking. This includes all medications from all doctors and over the counters

## 2018-09-10 NOTE — Progress Notes (Signed)
Justin Davidson, male    DOB: [birth year],     MRN: [id number]    Brief patient profile:  8 yobm never smoker healthy x "most of life has had  problems with too much spit" without  Any other  respiratory problems until onset indolent of day > noct urge to cough and clear throat  So referred to pulmonary clinic 09/10/2018 by Dr Ashby Dawes s/p neg eval by [facility] eval 10/07/17 dx  vasomotor rhinitis rec atrovent NS>> no better.  Note h/o acei intol  > converted to losartan but not clear when.(pt can't remember but wife had identical problem and she can't remember either)      09/10/2018   Pulmonary / 1st office eval  Chief Complaint  Patient presents with  . Pulmonary Consult    Referred by Dr. Merrilee Seashore.  Pt c/o cough for the past 2-3 years. He coughs up white to yellow sputum. He states he coughs every morning and then sometimes it wakes him up in the night.    Dyspnea p one aisle at [employer]:  [facility] = says can't walk 100 yards even at a slow pace at a flat grade s stopping due to sob  (see a/p as not able to reproduce this cc in office  Cough: 24/7 > clear mucus production  mostly esp around 2 am and bad again when wakes him in am assoc with some watery anterior nasal watery d/c  And cannot recall resp to prednisone or not / using lots of mints Sleep: flat bed/ 2 pillows and cpap q night  SABA use: none   No obvious day to day or daytime variability or assoc  purulent sputum or mucus plugs or hemoptysis or cp or chest tightness, subjective wheeze or overt  r  hb symptoms.   Also denies any obvious fluctuation of symptoms with weather or environmental changes or other aggravating or alleviating factors except as outlined above   No unusual exposure hx or h/o childhood pna/ asthma or knowledge of premature birth.  Current Allergies, Complete Past Medical History, Past Surgical History, Family History, and Social History were reviewed in [employer]  record.  ROS  The following are not active complaints unless bolded Hoarseness, sore throat, dysphagia, dental problems, itching, sneezing,  nasal congestion or discharge of excess mucus or purulent secretions, ear ache,   fever, chills, sweats, unintended wt loss or wt gain, classically pleuritic or exertional cp,  orthopnea pnd or arm/hand swelling  or leg swelling, presyncope, palpitations, abdominal pain, anorexia, nausea, vomiting, diarrhea  or change in bowel habits or change in bladder habits, change in stools or change in urine, dysuria, hematuria,  rash, arthralgias, visual complaints, headache, numbness, weakness or ataxia or problems with walking or coordination,  change in mood or  memory.           Past Medical History:  Diagnosis Date  . Coronary artery disease   . Dizziness and giddiness   . Hypercholesteremia   . Hyperglycemia   . Hypertension   . OSA (obstructive sleep apnea)     Outpatient Medications Prior to Visit  Medication Sig Dispense Refill  . aspirin EC 81 MG tablet Take 81 mg by mouth daily.    Marland Kitchen atorvastatin (LIPITOR) 80 MG tablet Take 80 mg by mouth daily.  0  . BISACODYL EC PO Take 5 mg by mouth as needed (for constipation).    . clopidogrel (PLAVIX) 75 MG tablet Take 75 mg by mouth  daily.  0  . donepezil (ARICEPT) 23 MG TABS tablet Take 23 mg by mouth at bedtime.     Marland Kitchen loratadine (CLARITIN) 10 MG tablet Take 10 mg by mouth daily.    Marland Kitchen losartan-hydrochlorothiazide (HYZAAR) 100-12.5 MG tablet Take 1 tablet by mouth every morning.  0  . metoprolol tartrate (LOPRESSOR) 50 MG tablet Take 50 mg by mouth 2 (two) times daily.  0  . potassium chloride (MICRO-K) 10 MEQ CR capsule TK 1 C PO QD  5  . Psyllium (METAMUCIL FIBER PO) Take by mouth.    . fluticasone (FLONASE) 50 MCG/ACT nasal spray Place 2 sprays into both nostrils daily. 16 g 0   No facility-administered medications prior to visit.             Objective:     BP 128/76 (BP Location: Left Arm,  Cuff Size: Large)   Pulse 60   Ht 5\' 11"  (1.803 m)   Wt (!) 314 lb (142.4 kg)   SpO2 96%   BMI 43.79 kg/m   SpO2: 96 %   RA  Wt Readings from Last 3 Encounters:  09/10/18 (!) 314 lb (142.4 kg)  05/22/18 (!) 305 lb (138.3 kg)  03/17/18 (!) 305 lb (138.3 kg)      amb bm holding bunch of tissues constantly clearing his throat   HEENT: nl dentition and oropharynx as far as what is viz. Nl external ear canals without cough reflex - Modified Mallampati Score =   4 / moderate bilateral non-specific turbinate edema     NECK :  without JVD/Nodes/TM/ nl carotid upstrokes bilaterally   LUNGS: no acc muscle use,  Nl contour chest which is clear to A and P bilaterally without cough on insp or exp maneuvers   CV:  RRR  no s3 or murmur or increase in P2, and no edema   ABD:  soft and nontender with nl inspiratory excursion in the supine position. No bruits or organomegaly appreciated, bowel sounds nl  MS:  Nl gait/ ext warm without deformities, calf tenderness, cyanosis or clubbing No obvious joint restrictions   SKIN: warm and dry without lesions    NEURO:  alert, approp, nl sensorium with  no motor or cerebellar deficits apparent.        I personally reviewed images and agree with radiology impression as follows:  CXR:   03/17/18  No active cardiopulmonary disease.   Labs ordered 09/10/2018  Allergy profile      Assessment   Upper airway cough syndrome Onset around 2016 with lifelong cc "too much throat mucus just like my mother" Spirometry 09/10/2018  FEV1 2.2 (75%)  Ratio 83  s curvature/ s prior rx  - FENO 09/10/2018  =    Could not do  - Allergy profile 09/10/2018 >  Eos 0.3 /  IgE  pending - trial of gerd rx and 1st gen H1 blockers per guidelines  09/10/2018 >>>  - Sinus CT ordered   The most common causes of chronic cough in immunocompetent adults include the following: upper airway cough syndrome (UACS), previously referred to as postnasal drip syndrome (PNDS), which  is caused by variety of rhinosinus conditions; (2) asthma; (3) GERD; (4) chronic bronchitis from cigarette smoking or other inhaled environmental irritants; (5) nonasthmatic eosinophilic bronchitis; and (6) bronchiectasis.   These conditions, singly or in combination, have accounted for up to 94% of the causes of chronic cough in prospective studies.   Other conditions have constituted no >6% of  the causes in prospective studies These have included bronchogenic carcinoma, chronic interstitial pneumonia, sarcoidosis, left ventricular failure, ACEI-induced cough, and aspiration from a condition associated with pharyngeal dysfunction.    Chronic cough is often simultaneously caused by more than one condition. A single cause has been found from 38 to 82% of the time, multiple causes from 18 to 62%. Multiply caused cough has been the result of three diseases up to 42% of the time.       He very clearly has Upper airway cough syndrome (previously labeled PNDS),  is so named because it's frequently impossible to sort out how much is  CR/sinusitis with freq throat clearing (which can be related to primary GERD)   vs  causing  secondary (" extra esophageal")  GERD from wide swings in gastric pressure that occur with throat clearing, often  promoting self use of mint and menthol lozenges that reduce the lower esophageal sphincter tone and exacerbate the problem further in a cyclical fashion.   These are the same pts (now being labeled as having "irritable larynx syndrome" by some cough centers) who not infrequently have a history of having failed to tolerate ace inhibitors and sometimes generic losartan (see hbp),  dry powder inhalers or biphosphonates or report having atypical/extraesophageal reflux symptoms that don't respond to standard doses of PPI  and are easily confused as having aecopd or asthma flares by even experienced allergists/ pulmonologists (myself included).    Of the three most common causes  of  Sub-acute / recurrent or chronic cough, only one (GERD)  can actually contribute to/ trigger  the other two (asthma and post nasal drip syndrome)  and perpetuate the cylce of cough.  While not intuitively obvious, many patients with chronic low grade reflux do not cough until there is a primary insult that disturbs the protective epithelial barrier and exposes sensitive nerve endings.   This is typically viral but can due to PNDS and  either may apply here.     The point is that once this occurs, it is difficult to eliminate the cycle  using anything but a maximally effective acid suppression regimen at least in the short run, accompanied by an appropriate diet to address non acid GERD and control / eliminate the pnds with 1st gen H1 blockers per guidelines  If tolerates then return here with all meds in hand using a trust but verify approach to confirm accurate Medication  Reconciliation The principal here is that until we are certain that the  patients are doing what we've asked, it makes no sense to ask them to do more.   DOE (dyspnea on exertion) Spirometry 09/10/2018  FEV1 2.2 (75%)  Ratio 83  - 09/10/2018  Walked RA x 3 laps @ 185 ft each stopped due to  End of study, fast pace, no  desat and min sob     Not able to reduce this chronic complaint in the office and no further w/u planned for now as likely related to obesity/ conditioning  Essential hypertension For reasons that may related to vascular permability and nitric oxide pathways but not elevated  bradykinin levels (as seen with  ACEi use) losartan in the generic form has been reported now from mulitple sources  to cause a similar pattern of non-specific  upper airway symptoms as seen with acei.   This has not been reported with exposure to the other ARB's to date, so it seems reasonable for now to try either generic diovan or avapro  if ARB needed or use an alternative class altogether.  See:  Lelon Frohlich Allergy Asthma Immunol  2008: 101: p  [phone]    rx substitute  micardis /hctz 80-12.5  instead  X one month trial   Morbid obesity due to excess calories (HCC) Body mass index is 43.79 kg/m.  -  trending up No results found for: TSH   Contributing to gerd risk/ doe/reviewed the need and the process to achieve and maintain neg calorie balance > defer f/u primary care including intermittently monitoring thyroid status       Total time devoted to counseling  > 50 % of initial 60 min office visit:  review case with pt/wife  discussion of options/alternatives/ personally creating written customized instructions  in presence of pt  then going over those specific  Instructions directly with the pt including how to use all of the meds but in particular covering each new medication in detail and the difference between the maintenance= "automatic" meds and the prns using an action plan format for the latter (If this problem/symptom => do that organization reading Left to right).  Please see AVS from this visit for a full list of these instructions which I personally wrote for this pt and  are unique to this visit.      Christinia Gully, MD 09/10/2018

## 2018-09-11 ENCOUNTER — Encounter: Payer: Self-pay | Admitting: Internal Medicine

## 2018-09-11 DIAGNOSIS — I10 Essential (primary) hypertension: Secondary | ICD-10-CM | POA: Insufficient documentation

## 2018-09-11 LAB — RESPIRATORY ALLERGY PROFILE REGION II ~~LOC~~
Allergen, A. alternata, m6: <0.1 kU/L
Allergen, Cedar tree, t12: <0.1 kU/L
Allergen, Comm Silver Birch, t9: <0.1 kU/L
Allergen, Cottonwood, t14: <0.1 kU/L
Allergen, D pternoyssinus,d7: <0.1 kU/L
Allergen, Mouse Urine Protein, e78: <0.1 kU/L
Allergen, Mulberry, t76: <0.1 kU/L
Allergen, Oak,t7: <0.1 kU/L
Allergen, P. notatum, m1: 0.12 kU/L — ABNORMAL HIGH
Aspergillus fumigatus, m3: <0.1 kU/L
Bermuda Grass: <0.1 kU/L
Box Elder IgE: <0.1 kU/L
CLADOSPORIUM HERBARUM (M2) IGE: <0.1 kU/L
COMMON RAGWEED (SHORT) (W1) IGE: <0.1 kU/L
Cat Dander: <0.1 kU/L
Class: 0
Class: 0
Class: 0
Class: 0
Class: 0
Class: 0
Class: 0
Class: 0
Class: 0
Class: 0
Class: 0
Class: 0
Class: 0
Class: 0
Class: 0
Class: 0
Class: 0
Class: 0
Class: 0
Class: 0
Class: 0
Class: 0
Class: 0
Class: 0
Cockroach: <0.1 kU/L
D. farinae: <0.1 kU/L
Dog Dander: <0.1 kU/L
Elm IgE: <0.1 kU/L
IgE (Immunoglobulin E), Serum: 211 kU/L — ABNORMAL HIGH (ref <=114)
Johnson Grass: <0.1 kU/L
Pecan/Hickory Tree IgE: <0.1 kU/L
Rough Pigweed  IgE: <0.1 kU/L
Sheep Sorrel IgE: <0.1 kU/L
Timothy Grass: <0.1 kU/L

## 2018-09-11 LAB — INTERPRETATION:

## 2018-09-11 NOTE — Assessment & Plan Note (Addendum)
Onset around 2016 with lifelong cc "too much throat mucus just like my mother" Spirometry 09/10/2018  FEV1 2.2 (75%)  Ratio 83  s curvature/ s prior rx  - FENO 09/10/2018  =    Could not do  - Allergy profile 09/10/2018 >  Eos 0.3 /  IgE  pending - trial of gerd rx and 1st gen H1 blockers per guidelines  09/10/2018 >>>  - Sinus CT ordered   The most common causes of chronic cough in immunocompetent adults include the following: upper airway cough syndrome (UACS), previously referred to as postnasal drip syndrome (PNDS), which is caused by variety of rhinosinus conditions; (2) asthma; (3) GERD; (4) chronic bronchitis from cigarette smoking or other inhaled environmental irritants; (5) nonasthmatic eosinophilic bronchitis; and (6) bronchiectasis.   These conditions, singly or in combination, have accounted for up to 94% of the causes of chronic cough in prospective studies.   Other conditions have constituted no >6% of the causes in prospective studies These have included bronchogenic carcinoma, chronic interstitial pneumonia, sarcoidosis, left ventricular failure, ACEI-induced cough, and aspiration from a condition associated with pharyngeal dysfunction.    Chronic cough is often simultaneously caused by more than one condition. A single cause has been found from 38 to 82% of the time, multiple causes from 18 to 62%. Multiply caused cough has been the result of three diseases up to 42% of the time.       He very clearly has Upper airway cough syndrome (previously labeled PNDS),  is so named because it's frequently impossible to sort out how much is  CR/sinusitis with freq throat clearing (which can be related to primary GERD)   vs  causing  secondary (" extra esophageal")  GERD from wide swings in gastric pressure that occur with throat clearing, often  promoting self use of mint and menthol lozenges that reduce the lower esophageal sphincter tone and exacerbate the problem further in a cyclical  fashion.   These are the same pts (now being labeled as having "irritable larynx syndrome" by some cough centers) who not infrequently have a history of having failed to tolerate ace inhibitors and sometimes generic losartan (see hbp),  dry powder inhalers or biphosphonates or report having atypical/extraesophageal reflux symptoms that don't respond to standard doses of PPI  and are easily confused as having aecopd or asthma flares by even experienced allergists/ pulmonologists (myself included).    Of the three most common causes of  Sub-acute / recurrent or chronic cough, only one (GERD)  can actually contribute to/ trigger  the other two (asthma and post nasal drip syndrome)  and perpetuate the cylce of cough.  While not intuitively obvious, many patients with chronic low grade reflux do not cough until there is a primary insult that disturbs the protective epithelial barrier and exposes sensitive nerve endings.   This is typically viral but can due to PNDS and  either may apply here.     The point is that once this occurs, it is difficult to eliminate the cycle  using anything but a maximally effective acid suppression regimen at least in the short run, accompanied by an appropriate diet to address non acid GERD and control / eliminate the pnds with 1st gen H1 blockers per guidelines  If tolerates then return here with all meds in hand using a trust but verify approach to confirm accurate Medication  Reconciliation The principal here is that until we are certain that the  patients are doing what  we've asked, it makes no sense to ask them to do more.

## 2018-09-11 NOTE — Assessment & Plan Note (Signed)
For reasons that may related to vascular permability and nitric oxide pathways but not elevated  bradykinin levels (as seen with  ACEi use) losartan in the generic form has been reported now from mulitple sources  to cause a similar pattern of non-specific  upper airway symptoms as seen with acei.   This has not been reported with exposure to the other ARB's to date, so it seems reasonable for now to try either generic diovan or avapro if ARB needed or use an alternative class altogether.  See:  Lelon Frohlich Allergy Asthma Immunol  2008: 101: p [phone]    rx substitute  micardis /hctz 80-12.5  instead  X one month trial

## 2018-09-11 NOTE — Assessment & Plan Note (Signed)
Spirometry 09/10/2018  FEV1 2.2 (75%)  Ratio 83  - 09/10/2018  Walked RA x 3 laps @ 185 ft each stopped due to  End of study, fast pace, no  desat and min sob     Not able to reduce this chronic complaint in the office and no further w/u planned for now as likely related to obesity/ conditioning

## 2018-09-11 NOTE — Assessment & Plan Note (Signed)
Body mass index is 43.79 kg/m.  -  trending up No results found for: TSH   Contributing to gerd risk/ doe/reviewed the need and the process to achieve and maintain neg calorie balance > defer f/u primary care including intermittently monitoring thyroid status       Total time devoted to counseling  > 50 % of initial 60 min office visit:  review case with pt/wife  discussion of options/alternatives/ personally creating written customized instructions  in presence of pt  then going over those specific  Instructions directly with the pt including how to use all of the meds but in particular covering each new medication in detail and the difference between the maintenance= "automatic" meds and the prns using an action plan format for the latter (If this problem/symptom => do that organization reading Left to right).  Please see AVS from this visit for a full list of these instructions which I personally wrote for this pt and  are unique to this visit.

## 2018-09-11 NOTE — Progress Notes (Signed)
LMTCB

## 2018-09-13 ENCOUNTER — Telehealth: Payer: Self-pay | Admitting: Internal Medicine

## 2018-09-13 NOTE — Telephone Encounter (Signed)
Called and spoke with patient wife per DPR, no documentation of phone call. Informed patient that I don't see labs, imaging, or results that would need to be reported so for right now nothing is need. Voiced understanding. Nothing is needed at this time.

## 2018-09-16 ENCOUNTER — Ambulatory Visit (INDEPENDENT_AMBULATORY_CARE_PROVIDER_SITE_OTHER)
Admission: RE | Admit: 2018-09-16 | Discharge: 2018-09-16 | Disposition: A | Discharge status: Home / Self Care | Payer: Medicare Other | Source: Ambulatory Visit | Attending: Internal Medicine | Admitting: Internal Medicine

## 2018-09-16 DIAGNOSIS — R058 Other specified cough: Secondary | ICD-10-CM

## 2018-09-16 DIAGNOSIS — R059 Cough, unspecified: Secondary | ICD-10-CM

## 2018-09-16 DIAGNOSIS — R05 Cough: Secondary | ICD-10-CM | POA: Diagnosis not present

## 2018-09-16 NOTE — Progress Notes (Signed)
Left detailed msg on machine ok per DPR

## 2018-09-16 NOTE — Progress Notes (Signed)
Spoke with the pt's spouse and notified of results/recs

## 2018-09-17 DIAGNOSIS — G4733 Obstructive sleep apnea (adult) (pediatric): Secondary | ICD-10-CM | POA: Diagnosis not present

## 2018-09-25 ENCOUNTER — Telehealth: Payer: Self-pay

## 2018-09-25 NOTE — Telephone Encounter (Signed)
I called pt per Megan's request to move pt's appt on 10/03/18. Spoke to pt's wife per DPR. Pt's wife is agreeable to an appt on 09/30/18 at 1:30pm, check in at 1:00pm. Pt's wife understands that the appt on Thursday will be cancelled. Pt's wife verbalized understanding of new appt date and time.

## 2018-09-29 ENCOUNTER — Encounter: Payer: Self-pay | Admitting: Adult Health

## 2018-09-30 ENCOUNTER — Encounter: Payer: Self-pay | Admitting: Adult Health

## 2018-09-30 ENCOUNTER — Ambulatory Visit: Payer: Medicare Other | Admitting: Adult Health

## 2018-09-30 VITALS — BP 123/62 | HR 56 | Ht 71.0 in | Wt 318.8 lb

## 2018-09-30 DIAGNOSIS — Z9989 Dependence on other enabling machines and devices: Secondary | ICD-10-CM

## 2018-09-30 DIAGNOSIS — G4733 Obstructive sleep apnea (adult) (pediatric): Secondary | ICD-10-CM

## 2018-09-30 NOTE — Progress Notes (Signed)
PATIENT: Justin Davidson DOB: 11-03-1946  REASON FOR VISIT: follow up HISTORY FROM: patient  HISTORY OF PRESENT ILLNESS: Today 09/30/18: Justin Davidson is a 72 year old male with a history of obstructive sleep apnea on CPAP.  He returns today for follow-up.  His CPAP download indicates that he uses machine 24 out of 30 days for compliance of 80%.  He uses machine greater than 4 hours 12 days for compliance of 40%.  On average he uses his machine 4 hours and 4 minutes.  His residual AHI is 8.8 on 7-18 cmH2O with EPR of 3.  His leak in the 95th percentile is 43.3 L/min.  He states that there are some nights that he will take the mask off before the 4-hour mark.  He does feel the mask leaking at night.  He tried adjusting his straps.  He has nasal pillows and has tried every size.  In the past he is on the full facemask but could not tolerate.  He returns today for evaluation.  HISTORY Justin Davidson is a 72 y.o. male patient , seen here  in a referral from Dr. Jacinto Halim for an evaluation of possible sleep apnea.   Chief complaint according to patient's  wife  "he is short of breath just leaving his car, or climbing stairs". The patient denies being short of breath while walking without incline. His wife stated he is not able to just walk from the parking lot into this office without SOB.  He has a lot of mucous , coughing, and his nose in running. He snores loudly and wakes with a dry mouth .   Sleep habits are as follows: he still works part time- comes home at 7 Pm and has supper, eats and goes to [employer], watches TV and is asleep while doing so. He snores loudly, and after 2 hours goes to the marital bedroom - and lets the TV run.  He sleeps on his side, the left mostly. His wife reports he is mostly supine, sleeps on multiple pillows, has orthopnea! He wakes and needs to urinate 2-3 times each night.  Rises at  7 AM , spontanuously.    Sleep medical history and family sleep history:  No family  history of OSA- Obesity hypoventilation , OSA diagnosed, dyspnea, hypersomnia at age 42 young.  Can't remember last PSG , probably 10-12 years ago.   Social history: married, he drinks caffeinated sodas ( 2-3 a day ) at his office. Iced tea now rarely , one coffee a day. Used to be a Surveyor, minerals, now deals in Administrator, arts estate. No exercise. 3 adult children, 25 daughter, son 23, son 27. One grandson.    Medical chart review: Justin Davidson reports that he has a lot of mucus congestion, and that he eliminates and spits mucus out all night.   He also has been tested by cardiac catheter which showed no abnormal ejection fraction, recent lab step studies and Dr. Verl Dicker office showed normal cholesterol at 145 triglycerides 113 HDL 34 a little low LDL 88, serum glucose 110, creatinine 0.82, potassium 3.0.  CBC was normal.  Excess exercise Myoview stress test was performed in 2012 at the time some diaphragmatic attenuation was noted his EF there was 75%.  Cardiac stress test were normal.  No abnormal EKG response recently.  On 12 Apr 2018 he was scheduled for myocardial perfusion imaging.  I do not have the results yet, diagnosis was morbid obesity with alveolar hypoventilation, also known as  obesity hypoventilation, shortness of breath, essential hypertension, coronary artery disease involving the native coronary artery of the native heart without angina pectoris, HbA1c was 6.0.   REVIEW OF SYSTEMS: Out of a complete 14 system review of symptoms, the patient complains only of the following symptoms, and all other reviewed systems are negative.  See HPI  ALLERGIES: Allergies  Allergen Reactions  . Lisinopril Cough    HOME MEDICATIONS: Outpatient Medications Prior to Visit  Medication Sig Dispense Refill  . aspirin EC 81 MG tablet Take 81 mg by mouth daily.    Marland Kitchen atorvastatin (LIPITOR) 80 MG tablet Take 80 mg by mouth daily.  0  . BISACODYL EC PO Take 5 mg by mouth as needed (for constipation).      . clopidogrel (PLAVIX) 75 MG tablet Take 75 mg by mouth daily.  0  . donepezil (ARICEPT) 23 MG TABS tablet Take 23 mg by mouth at bedtime.     . famotidine (PEPCID) 20 MG tablet One at bedtime 30 tablet 11  . metoprolol tartrate (LOPRESSOR) 50 MG tablet Take 50 mg by mouth 2 (two) times daily.  0  . pantoprazole (PROTONIX) 40 MG tablet Take 1 tablet (40 mg total) by mouth daily. Take 30-60 min before first meal of the day 30 tablet 2  . potassium chloride (MICRO-K) 10 MEQ CR capsule TK 1 C PO QD  5  . Psyllium (METAMUCIL FIBER PO) Take by mouth.    . telmisartan-hydrochlorothiazide (MICARDIS HCT) 80-25 MG tablet Take 1 tablet by mouth daily. 30 tablet 2   No facility-administered medications prior to visit.     PAST MEDICAL HISTORY: Past Medical History:  Diagnosis Date  . Coronary artery disease   . Dizziness and giddiness   . Hypercholesteremia   . Hyperglycemia   . Hypertension   . OSA (obstructive sleep apnea)     PAST SURGICAL HISTORY: Past Surgical History:  Procedure Laterality Date  . cardaic stents    . CARDIAC CATHETERIZATION    . PARTIAL COLECTOMY  1996   colon cancer    FAMILY HISTORY: Family History  Problem Relation Age of Onset  . Stroke Mother   . Hypertension Father   . Hyperlipidemia Father   . Hypertension Sister   . Hyperlipidemia Sister     SOCIAL HISTORY: Social History   Socioeconomic History  . Marital status: Married    Spouse name: Not on file  . Number of children: Not on file  . Years of education: Not on file  . Highest education level: Not on file  Occupational History  . Not on file  Social Needs  . Financial resource strain: Not on file  . Food insecurity:    Worry: Not on file    Inability: Not on file  . Transportation needs:    Medical: Not on file    Non-medical: Not on file  Tobacco Use  . Smoking status: Never Smoker  . Smokeless tobacco: Never Used  Substance and Sexual Activity  . Alcohol use: Yes  . Drug use:  Not Currently  . Sexual activity: Not on file  Lifestyle  . Physical activity:    Days per week: Not on file    Minutes per session: Not on file  . Stress: Not on file  Relationships  . Social connections:    Talks on phone: Not on file    Gets together: Not on file    Attends religious service: Not on file    Active  member of club or organization: Not on file    Attends meetings of clubs or organizations: Not on file    Relationship status: Not on file  . Intimate partner violence:    Fear of current or ex partner: Not on file    Emotionally abused: Not on file    Physically abused: Not on file    Forced sexual activity: Not on file  Other Topics Concern  . Not on file  Social History Narrative  . Not on file      PHYSICAL EXAM  Vitals:   09/30/18 1336  BP: 123/62  Pulse: (!) 56  Weight: (!) 318 lb 12.8 oz (144.6 kg)  Height: 5\' 11"  (1.803 m)   Body mass index is 44.46 kg/m.  Generalized: Well developed, in no acute distress   Neurological examination  Mentation: Alert oriented to time, place, history taking. Follows all commands speech and language fluent Cranial nerve II-XII:. Extraocular movements were full, visual field were full on confrontational test. Facial sensation and strength were normal. Uvula tongue midline. Head turning and shoulder shrug  were normal and symmetric.  Neck circumference inches, Mallampati 4+ Motor: The motor testing reveals 5 over 5 strength of all 4 extremities. Good symmetric motor tone is noted throughout.  Sensory: Sensory testing is intact to soft touch on all 4 extremities. No evidence of extinction is noted.  Coordination: Cerebellar testing reveals good finger-nose-finger and heel-to-shin bilaterally.  Gait and station: Gait is normal.    DIAGNOSTIC DATA (LABS, IMAGING, TESTING) - I reviewed patient records, labs, notes, testing and imaging myself where available.  Lab Results  Component Value Date   WBC 9.1 09/10/2018    HGB 14.3 09/10/2018   HCT 42.9 09/10/2018   MCV 89.7 09/10/2018   PLT 239.0 09/10/2018      Component Value Date/Time   NA 138 03/17/2018 1302   K 2.8 (L) 03/17/2018 1302   CL 102 03/17/2018 1302   CO2 25 03/17/2018 1302   GLUCOSE 126 (H) 03/17/2018 1302   BUN 17 03/17/2018 1302   CREATININE 0.87 03/17/2018 1302   CALCIUM 8.8 (L) 03/17/2018 1302   GFRNONAA >60 03/17/2018 1302   GFRAA >60 03/17/2018 1302      ASSESSMENT AND PLAN 72 y.o. year old male  has a past medical history of Coronary artery disease, Dizziness and giddiness, Hypercholesteremia, Hyperglycemia, Hypertension, and OSA (obstructive sleep apnea). here with :  1.  Obstructive sleep apnea on CPAP  The patient CPAP download shows suboptimal compliance.  The patient is encouraged to use the CPAP nightly and greater than 4 hours each night.  I have reviewed the risk associated with untreated sleep apnea.  An order will be sent to his DME company for mask refitting.  He is advised that his mask continues to leak he should let us know.  He will follow-up in 6 months or sooner if needed.  I spent 15 minutes with the patient. 50% of this time was spent reviewing CPAP download   Justin Penny, MSN, NP-C 09/30/2018, 2:03 PM [facility] [address], [address] [place], [place] [address] [phone]

## 2018-09-30 NOTE — Patient Instructions (Addendum)
Your Plan:  Continue using CPAP nightly and >4 hours each night Mask refitting with [employer] If your symptoms worsen or you develop new symptoms please let us know.   Please call [employer] at [phone], and press option 1 when prompted. Their customer service representatives will be glad to assist you. If they are unable to answer, please leave a message and they will call you back. Make sure to leave your name and return phone number.  Thank you for coming to see [place] at [facility]. I hope we have been able to provide you high quality care today.  You may receive a patient satisfaction survey over the next few weeks. We would appreciate your feedback and comments so that we may continue to improve ourselves and the health of our patients.

## 2018-10-01 NOTE — Progress Notes (Signed)
Relayed cpap order in Epic for [employer] via community message 09-30-18 sy

## 2018-10-03 ENCOUNTER — Ambulatory Visit: Payer: Self-pay | Admitting: Adult Health

## 2018-10-03 ENCOUNTER — Ambulatory Visit: Payer: Self-pay | Admitting: Neurology

## 2018-10-11 ENCOUNTER — Encounter: Payer: Self-pay | Admitting: Internal Medicine

## 2018-10-11 ENCOUNTER — Ambulatory Visit: Payer: Medicare Other | Admitting: Internal Medicine

## 2018-10-11 DIAGNOSIS — R05 Cough: Secondary | ICD-10-CM | POA: Diagnosis not present

## 2018-10-11 DIAGNOSIS — I10 Essential (primary) hypertension: Secondary | ICD-10-CM

## 2018-10-11 DIAGNOSIS — R058 Other specified cough: Secondary | ICD-10-CM

## 2018-10-11 NOTE — Assessment & Plan Note (Addendum)
Body mass index is 44.63 kg/m.  -  trending up  No results found for: TSH   Contributing to gerd risk/ doe/reviewed the need and the process to achieve and maintain neg calorie balance > defer f/u primary care including intermittently monitoring thyroid status      I had an extended summary final discussion with the patient/wife  reviewing all relevant studies completed to date and  lasting 15 to 20 minutes of a 25 minute visit    Each maintenance medication was reviewed in detail including most importantly the difference between maintenance and prns and under what circumstances the prns are to be triggered using an action plan format that is not reflected in the computer generated alphabetically organized AVS.     Please see AVS for specific instructions unique to this visit that I personally wrote and verbalized to the the pt in detail and then reviewed with pt  by my nurse highlighting any  changes in therapy recommended at today's visit to their plan of care.    >>> Pulmonary f/u is prn

## 2018-10-11 NOTE — Assessment & Plan Note (Signed)
Onset around 2016 with lifelong cc "too much throat mucus just like my mother" Spirometry 09/10/2018  FEV1 2.2 (75%)  Ratio 83  s curvature/ s prior rx  - FENO 09/10/2018  =    Could not do  - Allergy profile 09/10/2018 >  Eos 0.3 /  IgE 211 RAST min pos dust - trial of gerd rx and 1st gen H1 blockers per guidelines  09/10/2018 >>>  - Sinus CT 09/16/2018 >>> Mild chronic mucosal edema base of left maxillary sinus. Remaining sinuses clear.   Has improved some with elimination of mints but still having occ noct sense of drainage p h1 x 4 mg hs so rec  >>> elevate hob Sugarless candy Wt loss  Increase H1 if tol - if helps the drainage but can't tol the drowsiness may benefit from allergy eval o/w plan to consider trial of gabapentin or refer to [facility]/ Dr Joya Gaskins

## 2018-10-11 NOTE — Assessment & Plan Note (Addendum)
Changed losartan to micardis 09/10/2018    Although even in retrospect it may not be clear the losartan contributed to the pt's symptoms,  Pt improved off it and adding   back at this point or in the future would risk confusion in interpretation of non-specific respiratory symptoms to which this patient is prone  ie  Better not to muddy the waters here.   >>> rec continue micaridis 80-25 one daily

## 2018-10-11 NOTE — Progress Notes (Signed)
Justin Davidson, male    DOB: 1946/10/23,     MRN: [id number]    Brief patient profile:  22 yobm never smoker healthy x "most of life has had  problems with too much spit" without  Any other  respiratory problems until onset indolent of day > noct urge to cough and clear throat  So referred to pulmonary clinic 09/10/2018 by Dr Nicholos Johns s/p neg eval by Jenne Pane eval 10/07/17 dx  vasomotor rhinitis rec atrovent NS>> no better.  Note h/o acei intol  > converted to losartan but not clear when.(pt can't remember but wife had identical problem and she can't remember either)       Brief patient profile:  09/10/2018   Pulmonary / 1st office eval  Chief Complaint  Patient presents with  . Pulmonary Consult    Referred by Dr. Georgianne Fick.  Pt c/o cough for the past 2-3 years. He coughs up white to yellow sputum. He states he coughs every morning and then sometimes it wakes him up in the night.    Dyspnea p one aisle at [employer]eter:  [facility] = says can't walk 100 yards even at a slow pace at a flat grade s stopping due to sob  (see a/p as not able to reproduce this cc in office)  Cough: 24/7 > clear mucus production  mostly esp around 2 am and bad again when wakes him in am assoc with some watery anterior nasal watery d/c  And cannot recall resp to prednisone or not / using lots of mints Sleep: flat bed/ 2 pillows and cpap q night  SABA use: none rec For drainage / throat tickle try take CHLORPHENIRAMINE  4 mg - take one every 4 hours as needed - available over the counter-   Pantoprazole (protonix) 40 mg   Take  30-60 min before first meal of the day and Pepcid (famotidine)  20 mg one hour before bedtime until return to office - this is the best way to tell whether stomach acid is contributing to your problem.   Stop losartan and start micardis 80-12.5 one daily in its place  schedule sinus CT  GERD diet  office visit in 4 weeks, sooner if needed  with all medications /inhalers/ solutions in  hand so we can verify exactly what you are taking. This includes all medications from all doctors and over the counters    10/11/2018  f/u ov/Mickie Badders re: uacs/ did not bring meds as req / not using h1 but one at hs  Chief Complaint  Patient presents with  . Follow-up    f/u upper airway cough, still coughing, still using CPAP, feels a little better   Dyspnea:  Not limited by breathing from desired activities   Cough: still urge to clear throat but not longer carrying a wad of napkins, no longer using mints  Sleeping: on cpap maybe a little better / bed is flat with pillows no bed blocks  SABA use: no 02: no    No obvious day to day or daytime variability or assoc  purulent sputum or mucus plugs or hemoptysis or cp or chest tightness, subjective wheeze or overt sinus or hb symptoms.    Also denies any obvious fluctuation of symptoms with weather or environmental changes or other aggravating or alleviating factors except as outlined above   No unusual exposure hx or h/o childhood pna/ asthma or knowledge of premature birth.  Current Allergies, Complete Past Medical History, Past Surgical History, Family  History, and Social History were reviewed in [employer] record.  ROS  The following are not active complaints unless bolded Hoarseness, sore throat, dysphagia, dental problems, itching, sneezing,  nasal congestion or discharge of excess mucus or purulent secretions, ear ache,   fever, chills, sweats, unintended wt loss or wt gain, classically pleuritic or exertional cp,  orthopnea pnd or arm/hand swelling  or leg swelling, presyncope, palpitations, abdominal pain, anorexia, nausea, vomiting, diarrhea  or change in bowel habits or change in bladder habits, change in stools or change in urine, dysuria, hematuria,  rash, arthralgias, visual complaints, headache, numbness, weakness or ataxia or problems with walking or coordination,  change in mood or  memory.        Current  Meds  Medication Sig  . aspirin EC 81 MG tablet Take 81 mg by mouth daily.  Marland Kitchen atorvastatin (LIPITOR) 80 MG tablet Take 80 mg by mouth daily.  Marland Kitchen BISACODYL EC PO Take 5 mg by mouth as needed (for constipation).  . clopidogrel (PLAVIX) 75 MG tablet Take 75 mg by mouth daily.  Marland Kitchen donepezil (ARICEPT) 23 MG TABS tablet Take 23 mg by mouth at bedtime.   . famotidine (PEPCID) 20 MG tablet One at bedtime  . metoprolol tartrate (LOPRESSOR) 50 MG tablet Take 50 mg by mouth 2 (two) times daily.  . pantoprazole (PROTONIX) 40 MG tablet Take 1 tablet (40 mg total) by mouth daily. Take 30-60 min before first meal of the day  . potassium chloride (MICRO-K) 10 MEQ CR capsule TK 1 C PO QD  . Psyllium (METAMUCIL FIBER PO) Take by mouth.  . telmisartan-hydrochlorothiazide (MICARDIS HCT) 80-25 MG tablet Take 1 tablet by mouth daily.                            Objective:        10/11/2018          320   09/10/18 (!) 314 lb (142.4 kg)  05/22/18 (!) 305 lb (138.3 kg)  03/17/18 (!) 305 lb (138.3 kg)     Hoarse am bm nad    HEENT: nl dentition, turbinates bilaterally, and oropharynx. Nl external ear canals without cough reflex Modified Mallampati Score =   4,  mild bilateral non-specific turbinate edema     NECK :  without JVD/Nodes/TM/ nl carotid upstrokes bilaterally   LUNGS: no acc muscle use,  Nl contour chest which is clear to A and P bilaterally without cough on insp or exp maneuvers   CV:  RRR  no s3 or murmur or increase in P2, and no edema   ABD:  soft and nontender with nl inspiratory excursion in the supine position. No bruits or organomegaly appreciated, bowel sounds nl  MS:  Nl gait/ ext warm without deformities, calf tenderness, cyanosis or clubbing No obvious joint restrictions   SKIN: warm and dry without lesions    NEURO:  alert, approp, nl sensorium with  no motor or cerebellar deficits apparent.         Assessment

## 2018-10-11 NOTE — Patient Instructions (Addendum)
Elevate head of bed x 6 inch blocks under head of bed  For drainage / throat tickle try take CHLORPHENIRAMINE  4 mg - take one every 4 hours as needed - available over the counter- may cause drowsiness so start with just a bedtime  two and see how you tolerate it before trying in daytime    If not happy with results the next step is to see an allergist or see Dr Joya G[facility]ins at [facility]    Weight control is simply a matter of calorie balance which needs to be tilted in your favor by eating less and exercising more.  To get the most out of exercise, you need to be continuously aware that you are short of breath, but never out of breath, for 30 minutes daily. As you improve, it will actually be easier for you to do the same amount of exercise  in  30 minutes so always push to the level where you are short of breath.    Think of your calorie balance like you do your bank account where in this case you want the balance to go down so you must take in less calories than you burn up.  It's just that simple:  Hard to do, but easy to understand.  Good luck!

## 2018-10-18 DIAGNOSIS — S46812A Strain of other muscles, fascia and tendons at shoulder and upper arm level, left arm, initial encounter: Secondary | ICD-10-CM | POA: Diagnosis not present

## 2018-10-18 DIAGNOSIS — M549 Dorsalgia, unspecified: Secondary | ICD-10-CM | POA: Diagnosis not present

## 2018-10-18 DIAGNOSIS — M545 Low back pain: Secondary | ICD-10-CM | POA: Diagnosis not present

## 2018-10-18 DIAGNOSIS — M4696 Unspecified inflammatory spondylopathy, lumbar region: Secondary | ICD-10-CM | POA: Diagnosis not present

## 2018-10-18 DIAGNOSIS — G4733 Obstructive sleep apnea (adult) (pediatric): Secondary | ICD-10-CM | POA: Diagnosis not present

## 2018-10-18 DIAGNOSIS — S3992XA Unspecified injury of lower back, initial encounter: Secondary | ICD-10-CM | POA: Diagnosis not present

## 2018-10-18 DIAGNOSIS — M4316 Spondylolisthesis, lumbar region: Secondary | ICD-10-CM | POA: Diagnosis not present

## 2018-10-21 DIAGNOSIS — Z0000 Encounter for general adult medical examination without abnormal findings: Secondary | ICD-10-CM | POA: Diagnosis not present

## 2018-10-21 DIAGNOSIS — I10 Essential (primary) hypertension: Secondary | ICD-10-CM | POA: Diagnosis not present

## 2018-10-21 DIAGNOSIS — R7303 Prediabetes: Secondary | ICD-10-CM | POA: Diagnosis not present

## 2018-10-21 DIAGNOSIS — I2510 Atherosclerotic heart disease of native coronary artery without angina pectoris: Secondary | ICD-10-CM | POA: Diagnosis not present

## 2018-10-28 DIAGNOSIS — G4733 Obstructive sleep apnea (adult) (pediatric): Secondary | ICD-10-CM | POA: Diagnosis not present

## 2018-10-28 DIAGNOSIS — R0602 Shortness of breath: Secondary | ICD-10-CM | POA: Diagnosis not present

## 2018-10-28 DIAGNOSIS — Z23 Encounter for immunization: Secondary | ICD-10-CM | POA: Diagnosis not present

## 2018-10-28 DIAGNOSIS — E782 Mixed hyperlipidemia: Secondary | ICD-10-CM | POA: Diagnosis not present

## 2018-10-28 DIAGNOSIS — Z9989 Dependence on other enabling machines and devices: Secondary | ICD-10-CM | POA: Diagnosis not present

## 2018-10-28 DIAGNOSIS — R7303 Prediabetes: Secondary | ICD-10-CM | POA: Diagnosis not present

## 2018-10-28 DIAGNOSIS — I2510 Atherosclerotic heart disease of native coronary artery without angina pectoris: Secondary | ICD-10-CM | POA: Diagnosis not present

## 2018-10-28 DIAGNOSIS — I10 Essential (primary) hypertension: Secondary | ICD-10-CM | POA: Diagnosis not present

## 2018-11-17 DIAGNOSIS — G4733 Obstructive sleep apnea (adult) (pediatric): Secondary | ICD-10-CM | POA: Diagnosis not present

## 2018-11-28 DIAGNOSIS — G4733 Obstructive sleep apnea (adult) (pediatric): Secondary | ICD-10-CM | POA: Diagnosis not present

## 2018-12-18 DIAGNOSIS — G4733 Obstructive sleep apnea (adult) (pediatric): Secondary | ICD-10-CM | POA: Diagnosis not present

## 2019-01-15 DIAGNOSIS — K117 Disturbances of salivary secretion: Secondary | ICD-10-CM | POA: Diagnosis not present

## 2019-01-15 DIAGNOSIS — K219 Gastro-esophageal reflux disease without esophagitis: Secondary | ICD-10-CM | POA: Diagnosis not present

## 2019-01-18 DIAGNOSIS — G4733 Obstructive sleep apnea (adult) (pediatric): Secondary | ICD-10-CM | POA: Diagnosis not present

## 2019-01-24 ENCOUNTER — Telehealth: Payer: Self-pay | Admitting: Internal Medicine

## 2019-01-24 NOTE — Telephone Encounter (Signed)
Spoke with pt's wife, she wanted to know what blood test the pt had done. I advised her that he had an allergy blood panel and read her the recommendations. She wanted to know because pt's PCP wants him to see an allergist to get allergy testing. I advised her to let his PCP know he had the blood work done and see if he wants the results since he is not on Epic. Wife agreed and nothing further is needed.

## 2019-02-16 DIAGNOSIS — G4733 Obstructive sleep apnea (adult) (pediatric): Secondary | ICD-10-CM | POA: Diagnosis not present

## 2019-02-25 DIAGNOSIS — I2510 Atherosclerotic heart disease of native coronary artery without angina pectoris: Secondary | ICD-10-CM | POA: Diagnosis not present

## 2019-02-25 DIAGNOSIS — R7303 Prediabetes: Secondary | ICD-10-CM | POA: Diagnosis not present

## 2019-02-25 DIAGNOSIS — E782 Mixed hyperlipidemia: Secondary | ICD-10-CM | POA: Diagnosis not present

## 2019-02-27 DIAGNOSIS — K210 Gastro-esophageal reflux disease with esophagitis: Secondary | ICD-10-CM | POA: Diagnosis not present

## 2019-02-28 ENCOUNTER — Other Ambulatory Visit: Payer: Self-pay | Admitting: Cardiology

## 2019-03-04 DIAGNOSIS — R7303 Prediabetes: Secondary | ICD-10-CM | POA: Diagnosis not present

## 2019-03-04 DIAGNOSIS — E782 Mixed hyperlipidemia: Secondary | ICD-10-CM | POA: Diagnosis not present

## 2019-03-04 DIAGNOSIS — I10 Essential (primary) hypertension: Secondary | ICD-10-CM | POA: Diagnosis not present

## 2019-03-04 DIAGNOSIS — I2510 Atherosclerotic heart disease of native coronary artery without angina pectoris: Secondary | ICD-10-CM | POA: Diagnosis not present

## 2019-03-19 DIAGNOSIS — G4733 Obstructive sleep apnea (adult) (pediatric): Secondary | ICD-10-CM | POA: Diagnosis not present

## 2019-03-25 ENCOUNTER — Encounter: Payer: Self-pay | Admitting: Neurology

## 2019-03-25 ENCOUNTER — Telehealth: Payer: Self-pay | Admitting: Neurology

## 2019-03-25 NOTE — Telephone Encounter (Signed)
Called the patient to inform them that our office has placed new protocols in place for our office visits. Due to Covid 19 our office is reducing our number of office visits in order to minimize the risk to our patients and healthcare providers.Our office is now providing the capability to offer the patients virtual visits at this time. Patient agreed to telephone visit Informed of what that process looks like and informed that the Virtual visit will still be billed through insurance as such. Due to [place],informed the patient since the appointment is taking place over the phone/internet app, we can't guarantee the security of the phone line. With that said if we do move forward I would have to get verbal consent to completed the call over the phone. Patient gave verbal consent to move forward with the video visit. I have reviewed the patient's chart and made sure that everything is up to date. Patient is also made aware that since this is a video visit we are able to complete the visit but a physical exam is not able to be done since the patient is not present in person. Pt verbalized understanding of this information and will states to be ready for the visit at least 15 min prior to the visit.

## 2019-03-26 ENCOUNTER — Encounter: Payer: Self-pay | Admitting: Neurology

## 2019-04-01 ENCOUNTER — Ambulatory Visit (INDEPENDENT_AMBULATORY_CARE_PROVIDER_SITE_OTHER): Payer: Medicare Other | Admitting: Neurology

## 2019-04-01 ENCOUNTER — Encounter: Payer: Self-pay | Admitting: Neurology

## 2019-04-01 ENCOUNTER — Other Ambulatory Visit: Payer: Self-pay

## 2019-04-01 DIAGNOSIS — Z9989 Dependence on other enabling machines and devices: Secondary | ICD-10-CM | POA: Diagnosis not present

## 2019-04-01 DIAGNOSIS — G4710 Hypersomnia, unspecified: Secondary | ICD-10-CM

## 2019-04-01 DIAGNOSIS — G4733 Obstructive sleep apnea (adult) (pediatric): Secondary | ICD-10-CM

## 2019-04-01 NOTE — Progress Notes (Signed)
Virtual Visit via Telephone Note  I connected with Justin Davidson on 04/01/19 at  2:30 PM EDT by telephone and verified that I am speaking with the correct person using two identifiers.   I discussed the limitations, risks, security and privacy concerns of performing an evaluation and management service by telephone and the availability of in person appointments. I also discussed with the patient that there may be a patient responsible charge related to this service. The patient expressed understanding and agreed to proceed.   History of Present Illness: This is a telephone based CPAP compliance visit.   CC Dr Einar Gip, cardiologist and referring physician.     Observations/Objective: [name] is a 73 year old African-American married gentleman who originally presented being excessively daytime sleepy.  His cardiologist was concerned about him having sleep apnea and being untreated when he originally referred to sleep medicine. The study revealed severe sleep apnea and hypoxemia, too.  The total APNEA/HYPOPNEA INDEX (AHI) was 62.9 /hour and the total RESPIRATORY DISTURBANCE INDEX was 63.3 /hour. Total desaturation time was 144 minutes of  184 minutes of sleep- the study was SPLIT and the patient titrated to a final pressure of 15 cm water.  [name] was placed on an auto titration CPAP for home use with a minimum pressure of 7 and a maximum pressure of 18 cmH2O as well as 3 cm water expiratory pressure relief.  He is now a compliant CPAP user 100% of the last 30 days with a 95th percentile pressure of 12.2 cmH2O.  His residual AHI is 5.2, central apneas account for 1.1/h and obstructive apneas for 3.2/h.  I do not want to change his pressure settings as they seem to be well in the middle of the window.   ROS :  I asked him how his sleep experiences and he reported that he has now no longer excessive daytime sleepiness.  Initially he had endorsed the Epworth score at 15 points now between 6 and 8  out of 24 possible points, he sleeps much sounder, and better in quality and he has no need for daytime naps as he had before CPAP was initiated. He has one nocturia at night, no more.    He also states that his current mask fits well he is not bothered by the air leaks have been noted in the data download.   Social history:   As to his social history he drinks 1 cup of coffee a day he is a non-smoker, nondrinker and he regrets that his drinking has closed he has purchased some exercise equipment to be used in his home and he also walks with his wife about 30 minutes a day.   Assessment and Plan: There has been no history change as to medical diagnosis no interval hospital admissions but I asked him if any medication changed and his wife answered this question for him.  She stated that he had new medications for hypertension and for cholesterol control.  Overall she is very happy with his compliance bowels her husband and herself have gained better with more restorative sleep and she was happy to hear that his compliance has been noted.  CONTINUE CPAP USE.   Follow Up Instructions:   I will send a note to the durable medical equipment company, in his case [facility] [place].  I will have a follow-up visit with the patient in 12 months.  An Epworth score, fatigue score will be obtained again at the time of  his revisit     I discussed the assessment and treatment plan with the patient. The patient was provided an opportunity to ask questions and all were answered. The patient agreed with the plan and demonstrated an understanding of the instructions.   The patient was advised to call back or seek an in-person evaluation if the symptoms worsen or if the condition fails to improve as anticipated.  I provided 12 minutes of non-face-to-face time during this encounter.   Larey Seat, MD  04-01-2019  Larey Seat, M.D.  Diplomat, Tax adviser of Psychiatry and Neurology   Diplomat, Tax adviser of Sleep Medicine Market researcher, [employer] [facility] at [employer]   PS:  PATIENT'S NAME:  Justin Davidson, Justin Davidson DOB:      [birth year]      MR#:    [id number]     DATE OF RECORDING: 06/16/2018 REFERRING M.D.:  Adrian Prows, MD Study Performed:  Split-Night Titration Study HISTORY:  [name] is a 72 year old patient of Dr.Ganji's whose wife is concerned about him sleeping so much in daytime, stating: "he sleeps all the time, he is short of breath just leaving his car, or climbing stairs". The patient denies being short of breath. He has a lot of airway mucous causing him frequently to cough, and his nose is running. "He snores loudly, goes 2-3 times to the bathroom at night and wakes with a dry mouth". 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   The patient endorsed the Epworth Sleepiness Scale at 15/24 points, FSS at 32 points   The patient's weight 305 pounds with a height of 71 (inches), resulting in a BMI of 42.6 kg/m2. The patient's neck circumference measured 20 inches.  CURRENT MEDICATIONS: Aspirin, Atorvastatin, Bisacodyl, Clopidogrel, Donepezil, Fluticasone, Loratadine, Losartan-Hydrochlorothiazide, Metoprolol Tartrate, Potassium Chloride and Psyllium.   PROCEDURE:  This is a multichannel digital polysomnogram utilizing the Somnostar 11.2 system.  Electrodes and sensors were applied and monitored per AASM Specifications.   EEG, EOG, Chin and Limb EMG, were sampled at 200 Hz.  ECG, Snore and Nasal Pressure, Thermal Airflow, Respiratory Effort, CPAP Flow and Pressure, Oximetry was sampled at 50 Hz. Digital video and audio were recorded.      BASELINE STUDY WITHOUT CPAP RESULTS:  Lights Out was at 22:29 and Lights On at 05:27.  Total recording time (TRT) was 219.5, with a total sleep time (TST) of 184 minutes. The patient's sleep latency was 10.5 minutes.  REM latency was 74 minutes.  The sleep efficiency was  83.8 %.    SLEEP ARCHITECTURE: WASO (Wake after sleep onset) was 24 minutes, Stage N1 was 15.5 minutes, Stage N2 was 133 minutes, Stage N3 was 0 minutes and Stage R (REM sleep) was 35.5 minutes.  The percentages were Stage N1 8.4%, Stage N2 72.3%, Stage N3 0% and Stage R (REM sleep) 19.3%.   RESPIRATORY ANALYSIS:  There were a total of 193 respiratory events:  75 obstructive apneas, 0 central apneas and 118 hypopneas with only 1 additional respiratory event related arousal (RERAs) through snoring.     The total APNEA/HYPOPNEA INDEX (AHI) was 62.9 /hour and the total RESPIRATORY DISTURBANCE INDEX was 63.3 /hour.  49 events occurred in REM sleep and 180 events in NREM. The REM AHI was 82.8, /hour versus a non-REM AHI of 58.2 /hour. The patient spent 159 minutes sleep time in the supine position 184 minutes in non-supine. The supine AHI was 80.3 /hour versus a  non-supine AHI of 54.7 /hour.  OXYGEN SATURATION & C02:  The wake baseline 02 saturation was 82%, with the lowest being 70%. Time spent below 89% saturation equaled 144 minutes. End [place]idal CO2 during sleep was 42.9 torr.    PERIODIC LIMB MOVEMENTS: The patient had a total of 0 Periodic Limb Movements.  The arousals were noted as: 4 were spontaneous, 0 were associated with PLMs, and 196 were associated with respiratory events. Snoring was noted. 1 nocturia prior to CPAP placement. EKG in sinus rhythm (NSR).   TITRATION STUDY WITH CPAP RESULTS: CPAP was initiated at 5 cmH20 with heated humidity per AASM split night standards and pressure was advanced to 15/15 cmH20 because of hypopneas, apneas and desaturations.  At a PAP pressure of 14 and 15 cmH20, there was a reduction of the AHI to 0.0 /hour.   Total recording time (TRT) was 199.5 minutes, with a total sleep time (TST) of 158.5 minutes. The patient's sleep latency was 11 minutes. REM latency was 11 minutes.  The sleep efficiency was 79.4 %.   SLEEP ARCHITECTURE: Wake after sleep was 32 minutes,  Stage N1 10.5 minutes, Stage N2 97 minutes, Stage N3 0 minutes and Stage R (REM sleep) 51 minutes. The percentages were: Stage N1 6.6%, Stage N2 61.2%, Stage N3 0% and Stage R (REM sleep) 32.2%.   RESPIRATORY ANALYSIS:  There were a total of 84 respiratory events: 0 obstructive apneas, 0 central apneas and 0 mixed apneas with a total of 0 apneas and an apnea index (AI) of 0. There were 84 hypopneas with a hypopnea index of 31.8 /hour. The patient also had 0 respiratory event related arousals (RERAs).      The total APNEA/HYPOPNEA INDEX (AHI) was 31.8 /hour and the total RESPIRATORY DISTURBANCE INDEX was 31.8 /hour.  7 events occurred in REM sleep and 77 events in NREM. The REM AHI was 8.2 /hour versus a non-REM AHI of 43. /hour. The patient spent 63% of total sleep time in the supine position. The supine AHI was 45.0 /hour, versus a non-supine AHI of 9.2/hour.  OXYGEN SATURATION & C02:  The wake baseline 02 saturation was 92%, with the lowest being 81%. Time spent below 89% saturation equaled 39 minutes.  PERIODIC LIMB MOVEMENTS:   The patient had a total of 0 Periodic Limb Movements. The arousals were noted as: 18 were spontaneous, 0 were associated with PLMs, and 84 were associated with respiratory events. Post-study, the patient indicated that sleep was sounder and more restorative than usual. The patient was fitted with a large nasal pillow P 10.  POLYSOMNOGRAPHY IMPRESSION :   1. Severe Obstructive Sleep Apnea (OSA) at AHI 62.9 and accentuated by REM sleep (AHI was 82.8/h) and supine sleep to AHI 80.3 /h. 2. Prolonged total desaturation time of 144 minutes with SpO2 nadir of 70%.  3. Snoring 4. OSA responded well to CPAP pressures above 10 cm water, but did not correct hypoxemia as well.   5. RECOMMENDATIONS: The bestCPAP pressures of 14 and 15 cm water were difficult to achieve using a nasal pillow and the patient may need a different interface. 6.  I order an auto titration CPAP device at 7  cm water pressure through 18 cm water with 3 cm EPR. Heated humidity to be applied.   7. An overnight pulse-oximetry should be performed after the patiet has used CPAP for about 1 month.    Please instruct the patient that CPAP therapy compliance is defined as 4 hours or  more of nightly device use.  If problems with compliance arise, he is to inform his DME immediately- meaning that discomfort under CPAP use, logistical or technical problems with CPAP  are to be addressed without sacrificing Compliance.    A follow up appointment will be scheduled in the Sleep Clinic at [facility] between 60-90 days of therapy.      I certify that I have reviewed the entire raw data recording prior to the issuance of this report in accordance with the Standards of Accreditation of the American Academy of Sleep Medicine (AASM)    Larey Seat, M.D. 06-28-2018  Diplomat, American Board of Psychiatry and Neurology  Diplomat, [employer] of Sleep Medicine Medical Director, [place] [facility] [employer]

## 2019-04-02 DIAGNOSIS — G4733 Obstructive sleep apnea (adult) (pediatric): Secondary | ICD-10-CM | POA: Diagnosis not present

## 2019-04-08 ENCOUNTER — Telehealth: Payer: Self-pay | Admitting: Neurology

## 2019-04-08 NOTE — Telephone Encounter (Signed)
LVM to schedule 1 year follow-up with NP per Dr. Brett Fairy.

## 2019-04-18 DIAGNOSIS — G4733 Obstructive sleep apnea (adult) (pediatric): Secondary | ICD-10-CM | POA: Diagnosis not present

## 2019-06-19 ENCOUNTER — Encounter: Payer: Self-pay | Admitting: Cardiology

## 2019-07-02 DIAGNOSIS — G4733 Obstructive sleep apnea (adult) (pediatric): Secondary | ICD-10-CM | POA: Diagnosis not present

## 2019-07-14 ENCOUNTER — Other Ambulatory Visit: Payer: Self-pay | Admitting: Cardiology

## 2019-07-17 DIAGNOSIS — Z1159 Encounter for screening for other viral diseases: Secondary | ICD-10-CM | POA: Diagnosis not present

## 2019-07-22 DIAGNOSIS — D123 Benign neoplasm of transverse colon: Secondary | ICD-10-CM | POA: Diagnosis not present

## 2019-07-22 DIAGNOSIS — K5730 Diverticulosis of large intestine without perforation or abscess without bleeding: Secondary | ICD-10-CM | POA: Diagnosis not present

## 2019-07-22 DIAGNOSIS — K635 Polyp of colon: Secondary | ICD-10-CM | POA: Diagnosis not present

## 2019-07-22 DIAGNOSIS — K449 Diaphragmatic hernia without obstruction or gangrene: Secondary | ICD-10-CM | POA: Diagnosis not present

## 2019-07-22 DIAGNOSIS — Z85038 Personal history of other malignant neoplasm of large intestine: Secondary | ICD-10-CM | POA: Diagnosis not present

## 2019-07-22 DIAGNOSIS — Z980 Intestinal bypass and anastomosis status: Secondary | ICD-10-CM | POA: Diagnosis not present

## 2019-07-22 DIAGNOSIS — Z86010 Personal history of colonic polyps: Secondary | ICD-10-CM | POA: Diagnosis not present

## 2019-07-22 DIAGNOSIS — K2940 Chronic atrophic gastritis without bleeding: Secondary | ICD-10-CM | POA: Diagnosis not present

## 2019-07-22 DIAGNOSIS — K317 Polyp of stomach and duodenum: Secondary | ICD-10-CM | POA: Diagnosis not present

## 2019-07-22 DIAGNOSIS — K621 Rectal polyp: Secondary | ICD-10-CM | POA: Diagnosis not present

## 2019-07-25 DIAGNOSIS — K317 Polyp of stomach and duodenum: Secondary | ICD-10-CM | POA: Diagnosis not present

## 2019-07-25 DIAGNOSIS — D123 Benign neoplasm of transverse colon: Secondary | ICD-10-CM | POA: Diagnosis not present

## 2019-07-25 DIAGNOSIS — K635 Polyp of colon: Secondary | ICD-10-CM | POA: Diagnosis not present

## 2019-07-28 DIAGNOSIS — Z7189 Other specified counseling: Secondary | ICD-10-CM | POA: Diagnosis not present

## 2019-07-28 DIAGNOSIS — E782 Mixed hyperlipidemia: Secondary | ICD-10-CM | POA: Diagnosis not present

## 2019-07-28 DIAGNOSIS — I10 Essential (primary) hypertension: Secondary | ICD-10-CM | POA: Diagnosis not present

## 2019-07-28 DIAGNOSIS — Z0000 Encounter for general adult medical examination without abnormal findings: Secondary | ICD-10-CM | POA: Diagnosis not present

## 2019-07-28 DIAGNOSIS — R05 Cough: Secondary | ICD-10-CM | POA: Diagnosis not present

## 2019-07-28 DIAGNOSIS — I2510 Atherosclerotic heart disease of native coronary artery without angina pectoris: Secondary | ICD-10-CM | POA: Diagnosis not present

## 2019-07-28 DIAGNOSIS — R7303 Prediabetes: Secondary | ICD-10-CM | POA: Diagnosis not present

## 2019-08-01 DIAGNOSIS — E782 Mixed hyperlipidemia: Secondary | ICD-10-CM | POA: Diagnosis not present

## 2019-08-01 DIAGNOSIS — Z23 Encounter for immunization: Secondary | ICD-10-CM | POA: Diagnosis not present

## 2019-08-01 DIAGNOSIS — I10 Essential (primary) hypertension: Secondary | ICD-10-CM | POA: Diagnosis not present

## 2019-08-01 DIAGNOSIS — Z0000 Encounter for general adult medical examination without abnormal findings: Secondary | ICD-10-CM | POA: Diagnosis not present

## 2019-08-01 DIAGNOSIS — I2510 Atherosclerotic heart disease of native coronary artery without angina pectoris: Secondary | ICD-10-CM | POA: Diagnosis not present

## 2019-08-05 DIAGNOSIS — J387 Other diseases of larynx: Secondary | ICD-10-CM | POA: Diagnosis not present

## 2019-08-05 DIAGNOSIS — R05 Cough: Secondary | ICD-10-CM | POA: Diagnosis not present

## 2019-08-05 DIAGNOSIS — J383 Other diseases of vocal cords: Secondary | ICD-10-CM | POA: Diagnosis not present

## 2019-08-05 DIAGNOSIS — J384 Edema of larynx: Secondary | ICD-10-CM | POA: Diagnosis not present

## 2019-08-05 DIAGNOSIS — Z7289 Other problems related to lifestyle: Secondary | ICD-10-CM | POA: Diagnosis not present

## 2019-08-05 DIAGNOSIS — R1314 Dysphagia, pharyngoesophageal phase: Secondary | ICD-10-CM | POA: Diagnosis not present

## 2019-08-12 DIAGNOSIS — J383 Other diseases of vocal cords: Secondary | ICD-10-CM | POA: Insufficient documentation

## 2019-08-12 DIAGNOSIS — R1314 Dysphagia, pharyngoesophageal phase: Secondary | ICD-10-CM | POA: Insufficient documentation

## 2019-09-01 DIAGNOSIS — R1310 Dysphagia, unspecified: Secondary | ICD-10-CM | POA: Diagnosis not present

## 2019-09-01 DIAGNOSIS — J387 Other diseases of larynx: Secondary | ICD-10-CM | POA: Diagnosis not present

## 2019-09-01 DIAGNOSIS — R490 Dysphonia: Secondary | ICD-10-CM | POA: Diagnosis not present

## 2019-09-01 DIAGNOSIS — R633 Feeding difficulties: Secondary | ICD-10-CM | POA: Diagnosis not present

## 2019-09-01 DIAGNOSIS — R1312 Dysphagia, oropharyngeal phase: Secondary | ICD-10-CM | POA: Diagnosis not present

## 2019-09-01 DIAGNOSIS — R1314 Dysphagia, pharyngoesophageal phase: Secondary | ICD-10-CM | POA: Diagnosis not present

## 2019-09-01 DIAGNOSIS — J383 Other diseases of vocal cords: Secondary | ICD-10-CM | POA: Diagnosis not present

## 2019-10-02 DIAGNOSIS — G4733 Obstructive sleep apnea (adult) (pediatric): Secondary | ICD-10-CM | POA: Diagnosis not present

## 2019-10-20 ENCOUNTER — Other Ambulatory Visit: Payer: Self-pay | Admitting: Cardiology

## 2019-10-30 ENCOUNTER — Encounter: Payer: Self-pay | Admitting: Cardiology

## 2019-10-30 ENCOUNTER — Other Ambulatory Visit: Payer: Self-pay

## 2019-10-30 ENCOUNTER — Ambulatory Visit: Payer: Medicare Other | Admitting: Cardiology

## 2019-10-30 VITALS — BP 145/75 | HR 55 | Ht 71.0 in | Wt 309.0 lb

## 2019-10-30 DIAGNOSIS — I2510 Atherosclerotic heart disease of native coronary artery without angina pectoris: Secondary | ICD-10-CM | POA: Diagnosis not present

## 2019-10-30 DIAGNOSIS — R0600 Dyspnea, unspecified: Secondary | ICD-10-CM | POA: Diagnosis not present

## 2019-10-30 DIAGNOSIS — I10 Essential (primary) hypertension: Secondary | ICD-10-CM

## 2019-10-30 DIAGNOSIS — G4733 Obstructive sleep apnea (adult) (pediatric): Secondary | ICD-10-CM

## 2019-10-30 DIAGNOSIS — E7800 Pure hypercholesterolemia, unspecified: Secondary | ICD-10-CM | POA: Diagnosis not present

## 2019-10-30 DIAGNOSIS — R0609 Other forms of dyspnea: Secondary | ICD-10-CM

## 2019-10-30 MED ORDER — AMLODIPINE BESYLATE 5 MG PO TABS: 5.0000 mg | ORAL_TABLET | Freq: Every day | ORAL | 3 refills | Status: DC

## 2019-10-30 NOTE — Progress Notes (Signed)
Primary Physician/Referring:  Merrilee Seashore, MD  Patient ID: Justin Davidson, male    DOB: [birth year], 73 y.o.   MRN: [id number]  Chief Complaint  Patient presents with  . Coronary Artery Disease  . Hypertension  . Follow-up   HPI:    Justin Davidson  is a 73 y.o. AAM with coronary artery disease status post left circumflex and ramus intermedius PCI in 2011, residual moderate disease in mid LAD, remote history of colon cancer status post colon resection and chemotherapy, hypertension, hyperlipidemia, objective sleep apnea on CPAP.  His Workup in Apr 2019 showed mild LVH with hyperdynamic LV, grade 1 diastolic dysfunction, mild AI, mild TR, mild PI, my pulmonary hypertension with PA systolic pressure of 36 mmHg. Lexiscan nuclear stress test showed medium-sized area of perfusion defect of moderate intensity in the basal to apical inferior, inferolateral walls. This was likely related to soft tissue attenuation, although ischemia cannot be excluded. He was recommended aggressive medical therapy.  He is here on a 54-monthoffice visit and follow-up, he has not been compliant with CPAP, (73 y.o.) states that he is feeling the best he has in a while and wants to make lifestyle changes. Unfortunately has not lost any weight. No chest pain, dyspnea has remained stable. No leg edema.  Past Medical History:  Diagnosis Date  . Coronary artery disease   . Dizziness and giddiness   . Hypercholesteremia   . Hyperglycemia   . Hypertension   . OSA (obstructive sleep apnea)    Past Surgical History:  Procedure Laterality Date  . cardaic stents    . CARDIAC CATHETERIZATION    . PARTIAL COLECTOMY  1996   colon cancer   Social History   Socioeconomic History  . Marital status: Married    Spouse name: Not on file  . Number of children: 3  . Years of education: Not on file  . Highest education level: Not on file  Occupational History  . Not on file  Social Needs  . Financial resource strain: Not on  file  . Food insecurity    Worry: Not on file    Inability: Not on file  . Transportation needs    Medical: Not on file    Non-medical: Not on file  Tobacco Use  . Smoking status: Never Smoker  . Smokeless tobacco: Never Used  Substance and Sexual Activity  . Alcohol use: Yes    Comment: occasional  . Drug use: Not Currently  . Sexual activity: Not on file  Lifestyle  . Physical activity    Days per week: Not on file    Minutes per session: Not on file  . Stress: Not on file  Relationships  . Social [place] phone: Not on file    Gets together: Not on file    Attends religious service: Not on file    Active member of club or organization: Not on file    Attends meetings of clubs or organizations: Not on file    Relationship status: Not on file  . Intimate partner violence    Fear of current or ex partner: Not on file    Emotionally abused: Not on file    Physically abused: Not on file    Forced sexual activity: Not on file  Other Topics Concern  . Not on file  Social History Narrative  . Not on file   ROS  Review of Systems  Constitution: Negative for chills, decreased appetite, malaise/fatigue and weight  gain.  Cardiovascular: Positive for dyspnea on exertion (stable). Negative for leg swelling and syncope.  Respiratory: Positive for sleep disturbances due to breathing and snoring (severe sleep apnea and uses CPAP regularly).   Endocrine: Negative for cold intolerance.  Hematologic/Lymphatic: Does not bruise/bleed easily.  Musculoskeletal: Negative for joint swelling.  Gastrointestinal: Negative for abdominal pain, anorexia, change in bowel habit, hematochezia and melena.  Genitourinary: Positive for decreased libido.  Neurological: Negative for headaches and light-headedness.  Psychiatric/Behavioral: Negative for depression and substance abuse.  All other systems reviewed and are negative.  Objective   Vitals with BMI 10/30/2019 10/11/2018  09/30/2018  Height _0  _1  _2   Weight 309 lbs 320 lbs 318 lbs 13 oz  BMI 43.12 78.24 23.53  Systolic 614 431 540  Diastolic 75 74 62  Pulse 55 58 56      Physical Exam  Constitutional: He appears well-developed. No distress.  Morbidly obese  HENT:  Head: Atraumatic.  Eyes: Conjunctivae are normal.  Neck: Neck supple. No thyromegaly present.  Short neck and difficult to evaluate JVP  Cardiovascular: Normal rate, regular rhythm and normal heart sounds. Exam reveals no gallop.  No murmur heard. Pulses:      Carotid pulses are 2+ on the right side and 2+ on the left side.      Dorsalis pedis pulses are 2+ on the right side and 2+ on the left side.       Posterior tibial pulses are 2+ on the right side and 2+ on the left side.  Femoral and popliteal pulse difficult to feel due to patient's body habitus.   Pulmonary/Chest: Effort normal and breath sounds normal.  Abdominal: Soft. Bowel sounds are normal.  Obese. Pannus present  Musculoskeletal: Normal range of motion.        General: No edema.  Neurological: He is alert.  Skin: Skin is warm and dry.  Psychiatric: He has a normal mood and affect.   Laboratory examination:   Labs 04/02/2018: BMP 72.7.  Labs 02/12/2018: Serum glucose 150 mg, BUN 18, creatinine in 0.97, eGFR greater than 60 ML, potassium 3.3.  166.7.  Total cholesterol 124, triglycerides 119 HDL 34, LDL 66.  No results for input(s): NA, K, CL, CO2, GLUCOSE, BUN, CREATININE, CALCIUM, GFRNONAA, GFRAA in the last 8760 hours. CrCl cannot be calculated (Patient's most recent lab result is older than the maximum 21 days allowed.).  CMP Latest Ref Rng & Units 03/17/2018 09/06/2017 10/29/2010  Glucose 65 - 99 mg/dL 126(H) 113(H) 104(H)  BUN 6 - 20 mg/dL _3 Creatinine 0.61 - 1.24 mg/dL 0.87 1.06 0.80  Sodium 135 - 145 mmol/L 138 141 141  Potassium 3.5 - 5.1 mmol/L 2.8(L) 3.0(L) 3.5  Chloride 101 - 111 mmol/L 102 100(L) 104  CO2 22 - 32 mmol/L _4 Calcium 8.9 - 10.3 mg/dL 8.8(L) 9.3 8.8   CBC Latest Ref Rng & Units 09/10/2018 03/17/2018 09/06/2017  WBC 4.0 - 10.5 K/uL 9.1 9.9 9.1  Hemoglobin 13.0 - 17.0 g/dL 14.3 12.3(L) 13.2  Hematocrit 39.0 - 52.0 % 42.9 38.2(L) 40.1  Platelets 150.0 - 400.0 K/uL 239.0 253 240   Lipid Panel  No results found for: CHOL, TRIG, HDL, CHOLHDL, VLDL, LDLCALC, LDLDIRECT HEMOGLOBIN A1C No results found for: HGBA1C, MPG TSH No results for input(s): TSH in the last 8760 hours. Medications and allergies   Allergies  Allergen Reactions  . Lisinopril Cough     Current Outpatient Medications  Medication  Instructions  . amLODipine (NORVASC) 5 mg, Oral, Daily  . aspirin EC 81 mg, Oral, Daily  . atorvastatin (LIPITOR) 80 MG tablet TAKE 1 TABLET BY MOUTH DAILY  . BISACODYL EC PO 5 mg, Oral, As needed  . clopidogrel (PLAVIX) 75 MG tablet TAKE 1 TABLET BY MOUTH DAILY  . donepezil (ARICEPT) 23 mg, Oral, Daily at bedtime  . famotidine (PEPCID) 20 MG tablet One at bedtime  . metoprolol tartrate (LOPRESSOR) 50 MG tablet TAKE 1 TABLET BY MOUTH TWICE DAILY  . pantoprazole (PROTONIX) 40 mg, Oral, Daily, Take 30-60 min before first meal of the day  . potassium chloride (MICRO-K) 10 MEQ CR capsule TK 1 C PO QD  . Psyllium (METAMUCIL FIBER PO) Oral  . tamsulosin (FLOMAX) 0.4 MG CAPS capsule TK 1 C PO QD 30 MIN AFTER THE SAME MEAL  . telmisartan-hydrochlorothiazide (MICARDIS HCT) 80-25 MG tablet 1 tablet, Oral, Daily    Radiology:  No results found. Cardiac Studies:   Cardiac cath: 10/28/10: 3.5x23, Promus mid CX and 2.5x23 Promus mid Ramus intermediate (DES). Residual 50% -60% Mid LAD.   Nuclear stress test  04/12/2018: 1. Lexiscan stress test performed. Exercise capacity was not assessed. Stress symptoms included dyspnea, syncope, dizziness, and chest pain. Resting blood pressure was 134/82 mmHg. Blood pressure at 4 min was 102/56 mmHg. Patient had a presyncopal episode at the end of the stress test. Patient  recovered to baseline within few minutes. The resting electrocardiogram demonstrated normal sinus rhythm 60 bpm, normal resting conduction, no resting arrhythmias and normal rest repolarization. Stress EKG is non diagnostic for ischemia as it is a pharmacologic stress. 2. The overall quality of the study is fair. Left ventricular cavity is noted to be normal on the rest and stress studies. Gated SPECT images reveal normal myocardial thickening and wall motion. The left ventricular ejection fraction was calculated or visually estimated to be 76%. Medium sized area perfusion defect of moderate intensity in basal to apical inferior, inferolateral walls of myocardium likely related to soft tissue attenuation. However, ischemia in this territory cannot be excluded. 3. Low to intermediate risk study.  Echocardiogram  04/12/2018: Left ventricle cavity is normal in size. Mild concentric hypertrophy of the left ventricle. Normal global wall motion. Visual EF is 60-65%. Doppler evidence of grade I (impaired) diastolic dysfunction. Calculated EF 69%. Left atrial cavity is mildly dilated. Increased LVOT velocity likely due to LVH and hyperdynamic LV. No significant valvular stenosis seen. Mild (Grade I) aortic regurgitation. Mild tricuspid regurgitation. Mild pulmonic regurgitation. Mild pulmonary hypertension. Estimated pulmonary artery systolic pressure 36 mmHg. Compared to previous study in 2012, pulmonary hypertension is new.   Assessment     ICD-10-CM   1. Coronary artery disease involving native coronary artery of native heart without angina pectoris  I25.10 EKG 12-Lead  2. Essential hypertension  I10 amLODipine (NORVASC) 5 MG tablet  3. DOE (dyspnea on exertion)  R06.00   4. Morbid obesity due to excess calories (HCC)  E66.01   5. Hypercholesteremia  E78.00   6. Obstructive sleep apnea, severe and on CPAP 2019 - Dr.  Rexene Alberts  G47.33     EKG 10/30/2019: Normal sinus rhythm at rate of 63 bpm, normal  axis.  Nonspecific T abnormality.  PAC. Compared to  EKG 10/28/2018, non specific T new.   Recommendations:   Meds ordered this encounter  Medications  . amLODipine (NORVASC) 5 MG tablet    Sig: Take 1 tablet (5 mg total) by mouth daily.  Dispense:  90 tablet    Refill:  3    Justin Davidson  is a 73 y.o.  AAM  with coronary artery disease s/p prior LCx RI PCI in 2011, remote history of colon cancer status post colon resection and chemotherapy, hypertension, hyperlipidemia, obstructive sleep apnea on CPAP, presents here for annual visit.  Doing well without recurrence of recent angina and no clinical evidence of heart failure, he is now been compliant with CPAP, blood pressure is elevated, added Amlodipine 5 mg daily.  Lipids managed by PCP. Stable from cardiac standpoint and I will see him back annually.   Adrian Prows, MD, [facility] 10/30/2019, [facility]. [place] Pager: [phone] Office: [phone] If no answer Cell [phone]

## 2019-11-04 DIAGNOSIS — I10 Essential (primary) hypertension: Secondary | ICD-10-CM | POA: Diagnosis not present

## 2019-11-04 DIAGNOSIS — E782 Mixed hyperlipidemia: Secondary | ICD-10-CM | POA: Diagnosis not present

## 2020-01-02 DIAGNOSIS — Z79899 Other long term (current) drug therapy: Secondary | ICD-10-CM | POA: Diagnosis not present

## 2020-01-02 DIAGNOSIS — I2510 Atherosclerotic heart disease of native coronary artery without angina pectoris: Secondary | ICD-10-CM | POA: Diagnosis not present

## 2020-01-02 DIAGNOSIS — R7303 Prediabetes: Secondary | ICD-10-CM | POA: Diagnosis not present

## 2020-01-02 DIAGNOSIS — E782 Mixed hyperlipidemia: Secondary | ICD-10-CM | POA: Diagnosis not present

## 2020-01-02 DIAGNOSIS — I10 Essential (primary) hypertension: Secondary | ICD-10-CM | POA: Diagnosis not present

## 2020-01-08 DIAGNOSIS — G4733 Obstructive sleep apnea (adult) (pediatric): Secondary | ICD-10-CM | POA: Diagnosis not present

## 2020-01-09 DIAGNOSIS — E782 Mixed hyperlipidemia: Secondary | ICD-10-CM | POA: Diagnosis not present

## 2020-01-09 DIAGNOSIS — R7303 Prediabetes: Secondary | ICD-10-CM | POA: Diagnosis not present

## 2020-01-09 DIAGNOSIS — I10 Essential (primary) hypertension: Secondary | ICD-10-CM | POA: Diagnosis not present

## 2020-01-09 DIAGNOSIS — I2510 Atherosclerotic heart disease of native coronary artery without angina pectoris: Secondary | ICD-10-CM | POA: Diagnosis not present

## 2020-03-30 ENCOUNTER — Other Ambulatory Visit: Payer: Self-pay | Admitting: Cardiology

## 2020-04-01 ENCOUNTER — Other Ambulatory Visit: Payer: Self-pay | Admitting: Gastroenterology

## 2020-04-01 DIAGNOSIS — K317 Polyp of stomach and duodenum: Secondary | ICD-10-CM | POA: Diagnosis not present

## 2020-04-08 DIAGNOSIS — G4733 Obstructive sleep apnea (adult) (pediatric): Secondary | ICD-10-CM | POA: Diagnosis not present

## 2020-04-15 ENCOUNTER — Other Ambulatory Visit (HOSPITAL_COMMUNITY)
Admission: RE | Admit: 2020-04-15 | Discharge: 2020-04-15 | Disposition: A | Discharge status: Home / Self Care | Payer: Medicare Other | Source: Ambulatory Visit | Attending: Gastroenterology | Admitting: Gastroenterology

## 2020-04-15 ENCOUNTER — Encounter: Payer: Self-pay | Admitting: Cardiology

## 2020-04-15 DIAGNOSIS — Z01812 Encounter for preprocedural laboratory examination: Secondary | ICD-10-CM | POA: Diagnosis not present

## 2020-04-15 DIAGNOSIS — Z20822 Contact with and (suspected) exposure to covid-19: Secondary | ICD-10-CM | POA: Insufficient documentation

## 2020-04-15 LAB — SARS CORONAVIRUS 2 (TAT 6-24 HRS): SARS Coronavirus 2: NEGATIVE

## 2020-04-19 ENCOUNTER — Ambulatory Visit (HOSPITAL_COMMUNITY)
Admission: RE | Admit: 2020-04-19 | Discharge: 2020-04-19 | Disposition: A | Discharge status: Home / Self Care | Payer: Medicare Other | Attending: Gastroenterology | Admitting: Gastroenterology

## 2020-04-19 ENCOUNTER — Ambulatory Visit (HOSPITAL_COMMUNITY): Payer: Medicare Other | Admitting: Certified Registered Nurse Anesthetist

## 2020-04-19 ENCOUNTER — Encounter (HOSPITAL_COMMUNITY): Payer: Self-pay | Admitting: Gastroenterology

## 2020-04-19 ENCOUNTER — Other Ambulatory Visit: Payer: Self-pay

## 2020-04-19 ENCOUNTER — Encounter (HOSPITAL_COMMUNITY)
Admission: RE | Disposition: A | Discharge status: Home / Self Care | Payer: Self-pay | Source: Home / Self Care | Attending: Gastroenterology

## 2020-04-19 DIAGNOSIS — I2510 Atherosclerotic heart disease of native coronary artery without angina pectoris: Secondary | ICD-10-CM | POA: Insufficient documentation

## 2020-04-19 DIAGNOSIS — Z85038 Personal history of other malignant neoplasm of large intestine: Secondary | ICD-10-CM | POA: Diagnosis not present

## 2020-04-19 DIAGNOSIS — D132 Benign neoplasm of duodenum: Secondary | ICD-10-CM | POA: Insufficient documentation

## 2020-04-19 DIAGNOSIS — N400 Enlarged prostate without lower urinary tract symptoms: Secondary | ICD-10-CM | POA: Diagnosis not present

## 2020-04-19 DIAGNOSIS — E7800 Pure hypercholesterolemia, unspecified: Secondary | ICD-10-CM | POA: Diagnosis not present

## 2020-04-19 DIAGNOSIS — E785 Hyperlipidemia, unspecified: Secondary | ICD-10-CM | POA: Insufficient documentation

## 2020-04-19 DIAGNOSIS — G4730 Sleep apnea, unspecified: Secondary | ICD-10-CM | POA: Insufficient documentation

## 2020-04-19 DIAGNOSIS — I10 Essential (primary) hypertension: Secondary | ICD-10-CM | POA: Diagnosis not present

## 2020-04-19 DIAGNOSIS — K449 Diaphragmatic hernia without obstruction or gangrene: Secondary | ICD-10-CM | POA: Insufficient documentation

## 2020-04-19 DIAGNOSIS — Z7982 Long term (current) use of aspirin: Secondary | ICD-10-CM | POA: Insufficient documentation

## 2020-04-19 DIAGNOSIS — Z955 Presence of coronary angioplasty implant and graft: Secondary | ICD-10-CM | POA: Insufficient documentation

## 2020-04-19 DIAGNOSIS — Z79899 Other long term (current) drug therapy: Secondary | ICD-10-CM | POA: Diagnosis not present

## 2020-04-19 DIAGNOSIS — Z7902 Long term (current) use of antithrombotics/antiplatelets: Secondary | ICD-10-CM | POA: Insufficient documentation

## 2020-04-19 DIAGNOSIS — F0390 Unspecified dementia without behavioral disturbance: Secondary | ICD-10-CM | POA: Insufficient documentation

## 2020-04-19 DIAGNOSIS — K317 Polyp of stomach and duodenum: Secondary | ICD-10-CM | POA: Diagnosis not present

## 2020-04-19 HISTORY — PX: HOT HEMOSTASIS: SHX5433

## 2020-04-19 HISTORY — PX: ESOPHAGOGASTRODUODENOSCOPY (EGD) WITH PROPOFOL: SHX5813

## 2020-04-19 LAB — POCT I-STAT EG7
Acid-Base Excess: 3 mmol/L — ABNORMAL HIGH (ref 0.0–2.0)
Bicarbonate: 28.7 mmol/L — ABNORMAL HIGH (ref 20.0–28.0)
Calcium, Ion: 1.22 mmol/L (ref 1.15–1.40)
HCT: 43 % (ref 39.0–52.0)
Hemoglobin: 14.6 g/dL (ref 13.0–17.0)
O2 Saturation: 65 %
Potassium: 3.5 mmol/L (ref 3.5–5.1)
Sodium: 142 mmol/L (ref 135–145)
TCO2: 30 mmol/L (ref 22–32)
pCO2, Ven: 48.4 mmHg (ref 44.0–60.0)
pH, Ven: 7.381 (ref 7.250–7.430)
pO2, Ven: 35 mmHg (ref 32.0–45.0)

## 2020-04-19 SURGERY — ESOPHAGOGASTRODUODENOSCOPY (EGD) WITH PROPOFOL
Anesthesia: Monitor Anesthesia Care

## 2020-04-19 MED ORDER — SODIUM CHLORIDE 0.9 % IV SOLN: INTRAVENOUS | Status: DC

## 2020-04-19 MED ORDER — LACTATED RINGERS IV SOLN: INTRAVENOUS | Status: DC

## 2020-04-19 MED ORDER — SUCCINYLCHOLINE CHLORIDE 20 MG/ML IJ SOLN
INTRAMUSCULAR | Status: DC | PRN
Start: 2020-04-19 — End: 2020-04-19
  Administered 2020-04-19: 140 mg via INTRAVENOUS

## 2020-04-19 MED ORDER — LIDOCAINE 2% (20 MG/ML) 5 ML SYRINGE
INTRAMUSCULAR | Status: DC | PRN
  Administered 2020-04-19: 80 mg via INTRAVENOUS

## 2020-04-19 MED ORDER — PROPOFOL 10 MG/ML IV BOLUS
INTRAVENOUS | Status: DC | PRN
  Administered 2020-04-19: 80 mg via INTRAVENOUS

## 2020-04-19 MED ORDER — PROPOFOL 500 MG/50ML IV EMUL
INTRAVENOUS | Status: DC | PRN
  Administered 2020-04-19: 125 ug/kg/min via INTRAVENOUS

## 2020-04-19 SURGICAL SUPPLY — 15 items

## 2020-04-19 NOTE — Op Note (Signed)
[facility] Patient Name: Justin Davidson Procedure Date: 04/19/2020 MRN: [id number] Attending MD: Clarene Essex , MD Date of Birth: Apr 11, 1946 CSN: [id number] Age: 74 Admit Type: Outpatient Procedure:                Upper GI endoscopy Indications:              Polyp in the duodenum Providers:                Clarene Essex, MD, Cleda Daub, RN, Theodora Blow,                            Technician Referring MD:              Medicines:                Monitored Anesthesia Care Complications:            Bronchospasm and desaturation requiring intubation                            to finish the procedure Estimated Blood Loss:     Estimated blood loss: none. Procedure:                Pre-Anesthesia Assessment:                           - Prior to the procedure, a History and Physical                            was performed, and patient medications and                            allergies were reviewed. The patient's tolerance of                            previous anesthesia was also reviewed. The risks                            and benefits of the procedure and the sedation                            options and risks were discussed with the patient.                            All questions were answered, and informed consent                            was obtained. Prior Anticoagulants: The patient has                            taken Plavix (clopidogrel), last dose was 5 days                            prior to procedure. ASA Grade Assessment: III - A  patient with severe systemic disease. After                            reviewing the risks and benefits, the patient was                            deemed in satisfactory condition to undergo the                            procedure.                           After obtaining informed consent, the endoscope was                            passed under direct vision. Throughout the     procedure, the patient's blood pressure, pulse, and                            oxygen saturations were monitored continuously. The                            [id number] Olympus pediatric colonscope                            was introduced through the mouth, and advanced to                            the third part of duodenum. At the end of the                            procedure to better delineate the possible ampulla                            the [id number] Olympus Doudenoscope was                            introduced through the and advanced to the second                            portion of the duodenum. The upper GI endoscopy was                            technically difficult and complex due to abnormal                            anatomy. Successful completion of the procedure was                            aided by performing the maneuvers documented                            (below) in this report. The patient tolerated the  procedure. Scope In: Scope Out: Findings:      A tiny hiatal hernia was present.      The entire examined stomach was normal.      The duodenal bulb, first and third portion of the duodenum and minor       papilla were normal.      A single large semi-sessile polyp was found in the second portion of the       duodenum, at the major papilla and in the area of the papilla as       determined using the side-viewing scope. The polyp was removed with a       piecemeal technique using a hot snare. Polyp resection was incomplete.       The resected tissue was retrieved. We used the smaller snare and the       medium size snare under a setting of 2 and 15 but elected not to be       overly aggressive at this time      The exam was otherwise without abnormality. Impression:               - Tiny hiatal hernia.                           - Normal stomach.                           - Normal duodenal bulb, first  portion of the                            duodenum and minor papilla.                           - A single duodenal polyp. Incomplete resection.                            Resected tissue retrieved.                           - The examination was otherwise normal. Moderate Sedation:      Not Applicable - Patient had care per Anesthesia. Recommendation:           - Clear liquid diet for 6 hours.                           - Resume Plavix (clopidogrel) at prior dose in 2                            days.                           - Await pathology results.                           - Return to GI clinic PRN. Would intubate in the                            future with future endoscopic procedures                           -  Telephone GI clinic for pathology results in 1                            week.                           - Telephone GI clinic if symptomatic PRN. Procedure Code(s):        --- Professional ---                           [phone], Esophagogastroduodenoscopy, flexible,                            transoral; with removal of tumor(s), polyp(s), or                            other lesion(s) by snare technique Diagnosis Code(s):        --- Professional ---                           K44.9, Diaphragmatic hernia without obstruction or                            gangrene                           K31.7, Polyp of stomach and duodenum CPT copyright 2019 [employer]. All rights reserved. The codes documented in this report are preliminary and upon coder review may  be revised to meet current compliance requirements. Clarene Essex, MD 04/19/2020 11:57:50 AM This report has been signed electronically. Number of Addenda: 0

## 2020-04-19 NOTE — Addendum Note (Signed)
Addendum  created 04/19/20 1415 by West Pugh, CRNA   Flowsheet accepted, Intraprocedure Flowsheets edited

## 2020-04-19 NOTE — Anesthesia Postprocedure Evaluation (Signed)
Anesthesia Post Note  Patient: Justin Davidson  Procedure(s) Performed: ESOPHAGOGASTRODUODENOSCOPY (EGD) WITH PROPOFOL (N/A ) HOT HEMOSTASIS (ARGON PLASMA COAGULATION/BICAP) (N/A )     Patient location during evaluation: PACU Anesthesia Type: MAC Level of consciousness: awake and alert and oriented Pain management: pain level controlled Vital Signs Assessment: post-procedure vital signs reviewed and stable Respiratory status: spontaneous breathing, nonlabored ventilation and respiratory function stable Cardiovascular status: blood pressure returned to baseline and stable Postop Assessment: no apparent nausea or vomiting Anesthetic complications: no    Last Vitals:  Vitals:   04/19/20 1220 04/19/20 1230  BP: (!) 143/72 (!) 165/94  Pulse: 82 72  Resp: (!) 22 15  Temp:    SpO2: 98% 95%    Last Pain:  Vitals:   04/19/20 1230  TempSrc:   PainSc: 0-No pain                 Arneda Sappington A.

## 2020-04-19 NOTE — Anesthesia Preprocedure Evaluation (Addendum)
Anesthesia Evaluation  Patient identified by MRN, date of birth, ID band Patient awake    Reviewed: Allergy & Precautions, NPO status , Patient's Chart, lab work & pertinent test results, reviewed documented beta blocker date and time   Airway Mallampati: I  TM Distance: >3 FB Neck ROM: Full    Dental  (+) Caps, Missing, Poor Dentition   Pulmonary sleep apnea and Continuous Positive Airway Pressure Ventilation ,    Pulmonary exam normal breath sounds clear to auscultation       Cardiovascular hypertension, Pt. on medications and Pt. on home beta blockers + CAD and + DOE  Normal cardiovascular exam Rhythm:Regular Rate:Normal     Neuro/Psych Dementia negative neurological ROS     GI/Hepatic Neg liver ROS, Duodenal polyp   Endo/Other  Morbid obesityHyperlipidemia  Renal/GU negative Renal ROS   BPH    Musculoskeletal negative musculoskeletal ROS (+)   Abdominal (+) + obese,   Peds  Hematology Plavix therapy- last dose 04/14/20   Anesthesia Other Findings   Reproductive/Obstetrics                           Anesthesia Physical Anesthesia Plan  ASA: III  Anesthesia Plan: MAC   Post-op Pain Management:    Induction: Intravenous  PONV Risk Score and Plan: 1 and Ondansetron, Treatment may vary due to age or medical condition and Propofol infusion  Airway Management Planned: Natural Airway and Nasal Cannula  Additional Equipment:   Intra-op Plan:   Post-operative Plan:   Informed Consent: I have reviewed the patients History and Physical, chart, labs and discussed the procedure including the risks, benefits and alternatives for the proposed anesthesia with the patient or authorized representative who has indicated his/her understanding and acceptance.     Dental advisory given  Plan Discussed with: CRNA and Surgeon  Anesthesia Plan Comments:         Anesthesia Quick  Evaluation

## 2020-04-19 NOTE — Progress Notes (Signed)
Justin Davidson 8:55 AM  Subjective: Patient without any GI complaints and no new symptoms since we last saw him  Objective: Vital signs stable afebrile no acute distress exam please see preassessment evaluation  Assessment: Duodenal polyp  Plan: Okay to proceed with endoscopy with anesthesia assistance to reevaluate and biopsy of the polyp attempt at possible removal  [facility]  office [phone] After 5PM or if no answer call [phone]

## 2020-04-19 NOTE — Anesthesia Procedure Notes (Addendum)
Procedure Name: Intubation Date/Time: 04/19/2020 11:30 AM Performed by: West Pugh, CRNA Pre-anesthesia Checklist: Patient identified, Emergency Drugs available, Suction available, Patient being monitored and Timeout performed Patient Re-evaluated:Patient Re-evaluated prior to induction Oxygen Delivery Method: Circle system utilized Preoxygenation: Pre-oxygenation with 100% oxygen Induction Type: IV induction Ventilation: Mask ventilation with difficulty and Oral airway inserted - appropriate to patient size Laryngoscope Size: 3 and Glidescope Grade View: Grade I Tube type: Oral Tube size: 7.5 mm Number of attempts: 1 Airway Equipment and Method: Stylet and Video-laryngoscopy Placement Confirmation: ETT inserted through vocal cords under direct vision,  positive ETCO2,  CO2 detector and breath sounds checked- equal and bilateral Secured at: 21 cm Tube secured with: Tape Dental Injury: Teeth and Oropharynx as per pre-operative assessment  Comments: Patient desaturated, lots of secretions, hard to hold with two handed mask ventilation. Glidescope intubation. AOI.

## 2020-04-19 NOTE — Discharge Instructions (Signed)
Call if question or problem otherwise call for biopsy report in 1 week and we will decide how to proceed otherwise clear liquid diet until 6 PM and if doing well this evening may have soft solids and if no signs of bleeding may resume Plavix on Wednesday and may take aspirin tomorrow  YOU HAD AN ENDOSCOPIC PROCEDURE TODAY: Refer to the procedure report and other information in the discharge instructions given to you for any specific questions about what was found during the examination. If this information does not answer your questions, please call [facility] office at [phone] to clarify.   YOU SHOULD EXPECT: Some feelings of bloating in the abdomen. Passage of more gas than usual. Walking can help get rid of the air that was put into your GI tract during the procedure and reduce the bloating. If you had a lower endoscopy (such as a colonoscopy or flexible sigmoidoscopy) you may notice spotting of blood in your stool or on the toilet paper. Some abdominal soreness may be present for a day or two, also.  DIET: Your first meal following the procedure should be a light meal and then it is ok to progress to your normal diet. A half-sandwich or bowl of soup is an example of a good first meal. Heavy or fried foods are harder to digest and may make you feel nauseous or bloated. Drink plenty of fluids but you should avoid alcoholic beverages for 24 hours. If you had a esophageal dilation, please see attached instructions for diet.    ACTIVITY: Your care partner should take you home directly after the procedure. You should plan to take it easy, moving slowly for the rest of the day. You can resume normal activity the day after the procedure however YOU SHOULD NOT DRIVE, use power tools, machinery or perform tasks that involve climbing or major physical exertion for 24 hours (because of the sedation medicines used during the test).   SYMPTOMS TO REPORT IMMEDIATELY: A gastroenterologist can be reached at any  hour. Please call [phone]  for any of the following symptoms:  . Following lower endoscopy (colonoscopy, flexible sigmoidoscopy) Excessive amounts of blood in the stool  Significant tenderness, worsening of abdominal pains  Swelling of the abdomen that is new, acute  Fever of 100 or higher  . Following upper endoscopy (EGD, EUS, ERCP, esophageal dilation) Vomiting of blood or coffee ground material  New, significant abdominal pain  New, significant chest pain or pain under the shoulder blades  Painful or persistently difficult swallowing  New shortness of breath  Black, tarry-looking or red, bloody stools  FOLLOW UP:  If any biopsies were taken you will be contacted by phone or by letter within the next 1-3 weeks. Call [phone]  if you have not heard about the biopsies in 3 weeks.  Please also call with any specific questions about appointments or follow up tests.

## 2020-04-19 NOTE — Transfer of Care (Signed)
Immediate Anesthesia Transfer of Care Note  Patient: Justin Davidson  Procedure(s) Performed: ESOPHAGOGASTRODUODENOSCOPY (EGD) WITH PROPOFOL (N/A ) HOT HEMOSTASIS (ARGON PLASMA COAGULATION/BICAP) (N/A )  Patient Location: PACU and Endoscopy Unit  Anesthesia Type:General  Level of Consciousness: awake, alert  and patient cooperative  Airway & Oxygen Therapy: Patient Spontanous Breathing and Patient connected to face mask oxygen  Post-op Assessment: Report given to RN and Post -op Vital signs reviewed and stable  Post vital signs: Reviewed and stable  Last Vitals:  Vitals Value Taken Time  BP    Temp    Pulse 81 04/19/20 1206  Resp 21 04/19/20 1206  SpO2 97 % 04/19/20 1206  Vitals shown include unvalidated device data.  Last Pain:  Vitals:   04/19/20 0850  TempSrc: Oral  PainSc: 0-No pain         Complications: No apparent anesthesia complications

## 2020-04-20 LAB — SURGICAL PATHOLOGY

## 2020-04-21 ENCOUNTER — Encounter: Payer: Self-pay | Admitting: *Deleted

## 2020-05-07 DIAGNOSIS — E782 Mixed hyperlipidemia: Secondary | ICD-10-CM | POA: Diagnosis not present

## 2020-05-07 DIAGNOSIS — I10 Essential (primary) hypertension: Secondary | ICD-10-CM | POA: Diagnosis not present

## 2020-05-07 DIAGNOSIS — I2510 Atherosclerotic heart disease of native coronary artery without angina pectoris: Secondary | ICD-10-CM | POA: Diagnosis not present

## 2020-05-07 DIAGNOSIS — R7303 Prediabetes: Secondary | ICD-10-CM | POA: Diagnosis not present

## 2020-05-17 ENCOUNTER — Telehealth: Payer: Self-pay | Admitting: Gastroenterology

## 2020-05-17 NOTE — Telephone Encounter (Signed)
Thanks for update [name]. GM 

## 2020-05-17 NOTE — Telephone Encounter (Signed)
Pt notified and letter mailed

## 2020-05-17 NOTE — Telephone Encounter (Signed)
Appt made for 07/16/20 at 150 pm with Dr Rush Landmark

## 2020-05-17 NOTE — Telephone Encounter (Signed)
Case discussed with Dr. Watt Climes. In May he had proceeded with an attempt and duodenal polyp resection but this was removed in piecemeal but not completely. 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 After discussion with Dr. Watt Climes, he is asking for potential repeat resection attempt. I think this is reasonable in the next 3 to 6 months. [name], you should have received a referral from equal in regards to the patient. Please move forward with scheduling him a clinic visit with his wife/partner for further discussion of potential repeat attempt at EMR. We can plan a repeat resection attempt (EGD with EMR 90-minute slot) in approximately 3 to 4 months. Please let Dr. Watt Climes and I know when the patient has a clinic visit set. Thanks.  GM

## 2020-07-07 ENCOUNTER — Other Ambulatory Visit: Payer: Self-pay | Admitting: Cardiology

## 2020-07-09 DIAGNOSIS — G4733 Obstructive sleep apnea (adult) (pediatric): Secondary | ICD-10-CM | POA: Diagnosis not present

## 2020-07-16 ENCOUNTER — Ambulatory Visit: Payer: Medicare Other | Admitting: Gastroenterology

## 2020-07-16 ENCOUNTER — Encounter: Payer: Self-pay | Admitting: Gastroenterology

## 2020-07-16 ENCOUNTER — Telehealth: Payer: Self-pay

## 2020-07-16 ENCOUNTER — Other Ambulatory Visit (INDEPENDENT_AMBULATORY_CARE_PROVIDER_SITE_OTHER): Payer: Medicare Other

## 2020-07-16 ENCOUNTER — Encounter: Payer: Self-pay | Admitting: Cardiology

## 2020-07-16 VITALS — BP 100/60 | HR 60 | Ht 70.0 in | Wt 308.1 lb

## 2020-07-16 DIAGNOSIS — R198 Other specified symptoms and signs involving the digestive system and abdomen: Secondary | ICD-10-CM

## 2020-07-16 DIAGNOSIS — D132 Benign neoplasm of duodenum: Secondary | ICD-10-CM

## 2020-07-16 DIAGNOSIS — Z7902 Long term (current) use of antithrombotics/antiplatelets: Secondary | ICD-10-CM

## 2020-07-16 LAB — CBC
HCT: 42.5 % (ref 39.0–52.0)
Hemoglobin: 14.1 g/dL (ref 13.0–17.0)
MCHC: 33.2 g/dL (ref 30.0–36.0)
MCV: 89.5 fl (ref 78.0–100.0)
Platelets: 226 10*3/uL (ref 150.0–400.0)
RBC: 4.75 Mil/uL (ref 4.22–5.81)
RDW: 15.9 % — ABNORMAL HIGH (ref 11.5–15.5)
WBC: 9.3 10*3/uL (ref 4.0–10.5)

## 2020-07-16 LAB — BASIC METABOLIC PANEL
BUN: 21 mg/dL (ref 6–23)
CO2: 32 mEq/L (ref 19–32)
Calcium: 9.1 mg/dL (ref 8.4–10.5)
Chloride: 103 mEq/L (ref 96–112)
Creatinine, Ser: 0.9 mg/dL (ref 0.40–1.50)
GFR: 99.32 mL/min (ref >60.00)
Glucose, Bld: 94 mg/dL (ref 70–99)
Potassium: 3.5 mEq/L (ref 3.5–5.1)
Sodium: 138 mEq/L (ref 135–145)

## 2020-07-16 LAB — PROTIME-INR
INR: 1.1 ratio — ABNORMAL HIGH (ref 0.8–1.0)
Prothrombin Time: 12.4 s (ref 9.6–13.1)

## 2020-07-16 NOTE — Progress Notes (Signed)
GASTROENTEROLOGY OUTPATIENT CLINIC VISIT   Primary Care Provider Merrilee Seashore, [place] [id number] [phone]  Referring Provider Dr. Watt Climes  Patient Profile: Justin Davidson is a 74 y.o. male with a pmh significant for CAD (on Plavix), hypertension, hyperlipidemia, sleep apnea, dementia GERD, previous colon cancer (status post resection), duodenal adenoma (status post incomplete resection).  The patient presents to the [facility] for an evaluation and management of problem(s) noted below:  Problem List 1. Duodenal adenoma   2. Abnormal findings on esophagogastroduodenoscopy (EGD)   3. Antiplatelet or antithrombotic long-term use     History of Present Illness This is the patient's first visit to the outpatient [place] clinic.  He normally follows with Dr. Watt Climes for his GI care.  The patient underwent an upper and lower endoscopy earlier this year at [place] office.  A duodenal adenoma was found.  The patient presented to [facility] long for an outpatient endoscopy for attempt at removal of the duodenal adenoma.  Incomplete resection was obtained.  Pathology showed no evidence of high-grade glandular changes or high-grade dysplasia and just a duodenal adenoma.  It is for this reason that the patient is referred for consideration of further resection of the duodenal adenoma.  The patient is accompanied by his wife as well as daughter who helped with history.  Patient denies any other significant GI issues that are not already currently controlled.  Initially they thought that the lesion was in the colon but they now understand that it is in the small bowel.  GI Review of Systems Positive as above Negative for dysphagia, odynophagia, nausea, vomiting, abdominal pain, bloating, change in bowel habits, melena, hematochezia  Review of Systems General: Denies fevers/chills/weight loss unintentionally HEENT: Denies oral  lesions Cardiovascular: Denies chest pain Pulmonary: Denies shortness of breath Gastroenterological: See HPI Genitourinary: Denies darkened urine Hematological: Positive for easy bruising/bleeding due to Plavix Dermatological: Denies jaundice Psychological: Mood is stable   Medications Current Outpatient Medications  Medication Sig Dispense Refill  . amLODipine (NORVASC) 5 MG tablet Take 1 tablet (5 mg total) by mouth daily. 90 tablet 3  . aspirin EC 81 MG tablet Take 81 mg by mouth daily.    Marland Kitchen atorvastatin (LIPITOR) 80 MG tablet TAKE 1 TABLET BY MOUTH DAILY 90 tablet 3  . bisacodyl (BISACODYL) 5 MG EC tablet Take 5 mg by mouth daily.     . clonazePAM (KLONOPIN) 0.5 MG tablet Take 0.5 mg by mouth daily as needed.    . clopidogrel (PLAVIX) 75 MG tablet TAKE 1 TABLET BY MOUTH DAILY (Patient taking differently: Take 75 mg by mouth daily. ) 90 tablet 3  . donepezil (ARICEPT) 23 MG TABS tablet Take 23 mg by mouth at bedtime.     . metoprolol tartrate (LOPRESSOR) 50 MG tablet TAKE 1 TABLET BY MOUTH TWICE DAILY (Patient taking differently: Take 50 mg by mouth 2 (two) times daily. ) 180 tablet 3  . potassium chloride (MICRO-K) 10 MEQ CR capsule Take 10 mEq by mouth daily.   5  . Psyllium (METAMUCIL FIBER PO) Take 1 Scoop by mouth daily.     . tamsulosin (FLOMAX) 0.4 MG CAPS capsule Take 0.4 mg by mouth daily after breakfast.     . telmisartan-hydrochlorothiazide (MICARDIS HCT) 80-25 MG tablet Take 1 tablet by mouth daily. 30 tablet 2   No current facility-administered medications for this visit.    Allergies Allergies  Allergen Reactions  . Lisinopril Cough  Histories Past Medical History:  Diagnosis Date  . Anxiety   . Colon cancer ([place])   . Colon polyps   . Coronary artery disease   . Dementia ([place])   . Dizziness and giddiness   . GERD (gastroesophageal reflux disease)   . Hypercholesteremia   . Hyperglycemia   . Hypertension   . OSA (obstructive sleep apnea)    CPAP  .  Prediabetes    Past Surgical History:  Procedure Laterality Date  . cardaic stents    . CARDIAC CATHETERIZATION    . ESOPHAGOGASTRODUODENOSCOPY (EGD) WITH PROPOFOL N/A 04/19/2020   Procedure: ESOPHAGOGASTRODUODENOSCOPY (EGD) WITH PROPOFOL;  Surgeon: Clarene Essex, MD;  Location: [facility] ENDOSCOPY;  Service: Endoscopy;  Laterality: N/A;  . HOT HEMOSTASIS N/A 04/19/2020   Procedure: HOT HEMOSTASIS (ARGON PLASMA COAGULATION/BICAP);  Surgeon: Clarene Essex, MD;  Location: [facility];  Service: Endoscopy;  Laterality: N/A;  . PARTIAL COLECTOMY  1996   colon cancer   Social History   Socioeconomic History  . Marital status: Married    Spouse name: Not on file  . Number of children: 3  . Years of education: Not on file  . Highest education level: Not on file  Occupational History  . Occupation: retired  Tobacco Use  . Smoking status: Never Smoker  . Smokeless tobacco: Never Used  Vaping Use  . Vaping Use: Never used  Substance and Sexual Activity  . Alcohol use: Yes    Comment: occasional  . Drug use: Not Currently  . Sexual activity: Not on file  Other Topics Concern  . Not on file  Social History Narrative  . Not on file   Social Determinants of Health   Financial Resource Strain:   . Difficulty of Paying Living Expenses:   Food Insecurity:   . Worried About Charity fundraiser in the Last Year:   . Arboriculturist in the Last Year:   Transportation Needs:   . Film/video editor (Medical):   Marland Kitchen Lack of Transportation (Non-Medical):   Physical Activity:   . Days of Exercise per Week:   . Minutes of Exercise per Session:   Stress:   . Feeling of Stress :   Social Connections:   . Frequency of Communication with Friends and Family:   . Frequency of Social Gatherings with Friends and Family:   . Attends Religious Services:   . Active Member of Clubs or Organizations:   . Attends Archivist Meetings:   Marland Kitchen Marital Status:   Intimate Partner Violence:   . Fear of  Current or Ex-Partner:   . Emotionally Abused:   Marland Kitchen Physically Abused:   . Sexually Abused:    Family History  Problem Relation Age of Onset  . Stroke Mother   . Diabetes Mother   . Hypertension Father   . Hyperlipidemia Father   . Seizures Father   . Diabetes Father   . Hypertension Sister   . Hyperlipidemia Sister   . Heart disease Paternal Grandfather   . Diabetes Paternal Grandfather   . GER disease Daughter   . Colon cancer Neg Hx   . Esophageal cancer Neg Hx   . Inflammatory bowel disease Neg Hx   . Liver disease Neg Hx   . Pancreatic cancer Neg Hx   . Rectal cancer Neg Hx   . Stomach cancer Neg Hx    I have reviewed his medical, social, and family history in detail and updated the electronic medical record as  necessary.    PHYSICAL EXAMINATION  BP 100/60 (BP Location: Left Arm, Patient Position: Sitting, Cuff Size: Large)   Pulse 60   Ht 5\' 10"  (1.778 m) Comment: height measured without shoes  Wt (!) 308 lb 2 oz (139.8 kg)   BMI 44.21 kg/m  Wt Readings from Last 3 Encounters:  07/16/20 (!) 308 lb 2 oz (139.8 kg)  04/19/20 (!) 305 lb (138.3 kg)  10/30/19 (!) 309 lb (140.2 kg)  GEN: NAD, appears stated age, doesn't appear chronically ill, accompanied by wife and daughter PSYCH: Cooperative, without pressured speech EYE: Conjunctivae pink, sclerae anicteric ENT: MMM CV: Nontachycardic RESP: No audible wheezing GI: NABS, rounded, obese, nontender, without rebound MSK/EXT: Lower extremity edema is present SKIN: No jaundice NEURO:  Alert & Oriented x 3, no focal deficits   REVIEW OF DATA  I reviewed the following data at the time of this encounter:  GI Procedures and Studies  May 2021 EGD - Tiny hiatal hernia. - Normal stomach. - Normal duodenal bulb, first portion of the duodenum and minor papilla. - A single duodenal polyp. Incomplete resection. Resected tissue retrieved. - The examination was otherwise normal. Pathology FINAL MICROSCOPIC DIAGNOSIS:   A. DUODENUM, POLYPECTOMY:  - Duodenal adenoma.  - Negative for high grade dysplasia.  Laboratory Studies  Reviewed those in epic  Imaging Studies  No relevant studies to review   ASSESSMENT  Mr. Mackowski is a 74 y.o. male  with a pmh significant for CAD (on Plavix), hypertension, hyperlipidemia, sleep apnea, dementia GERD, previous colon cancer (status post resection), duodenal adenoma (status post incomplete resection).  The patient is seen today for evaluation and management of:  1. Duodenal adenoma   2. Abnormal findings on esophagogastroduodenoscopy (EGD)   3. Antiplatelet or antithrombotic long-term use    The patient is hemodynamically and clinically stable.  The patient has an incompletely resected duodenal adenoma.  It looks to be distal to the ampulla based on notation of the last endoscopy report.  No evidence of high-grade dysplasia was found in the portions of the polyp that were resected.  Based upon the description and endoscopic pictures I do feel that it is reasonable to pursue an Advanced Polypectomy attempt of the polyp/lesion.  The patient and family understand that since the polyp has been worked on previously, attempted resection is going to be more complex due to likely issues of scarring.  Also as result of his antiplatelet therapy use he is at increased risk for bleeding (thankfully he did not have bleeding after the last resection attempt).  We discussed some of the techniques of advanced polypectomy which include Endoscopic Mucosal Resection, OVESCO Full-Thickness Resection, Endorotor Morcellation, and Tissue Ablation via Fulguration.  The risks and benefits of endoscopic evaluation were discussed with the patient; these include but are not limited to the risk of perforation, infection, bleeding, missed lesions, lack of diagnosis, severe illness requiring hospitalization, as well as anesthesia and sedation related illnesses.  During attempts at advanced resection, the  risks of bleeding and perforation/leak are increased as opposed to diagnostic and screening procedures, and that was discussed with the patient as well.   In addition, I explained that with the possible need for piecemeal resection, subsequent short-interval endoscopic evaluation for follow up and potential retreatment of the lesion/area may be necessary.  I did offer, a referral to surgery in order for patient to have opportunity to discuss surgical management/intervention prior to finalizing decision for attempt at endoscopic removal, however, the patient deferred on  this.  If, after attempt at removal of the polyp/lesion, it is found that the patient has a complication or that an invasive lesion or malignant lesion is found, or that the polyp/lesion continues to recur, the patient is aware and understands that surgery may still be indicated/required.  We will proceed with obtaining approval for coming off Lasix from his cardiologist, Dr. Einar Gip for at least 5 days prior to the procedure and 2 to 3 days after the procedure based on what we are able to accomplish.  All patient questions were answered, to the best of my ability, and the patient agrees to the aforementioned plan of action with follow-up as indicated.   PLAN  Preprocedure labs to be obtained Proceed with scheduling EGD with EMR 2-hour slot in coming weeks Obtain approval for coming off Plavix for Dr. Einar Gip for at least 5 days prior to procedure   Orders Placed This Encounter  Procedures  . Procedural/ Surgical Case Request: ESOPHAGOGASTRODUODENOSCOPY (EGD) WITH PROPOFOL, ENDOSCOPIC MUCOSAL RESECTION  . CBC  . Basic Metabolic Panel (BMET)  . INR/PT  . Ambulatory referral to Gastroenterology    New Prescriptions   No medications on file   Modified Medications   No medications on file    Planned Follow Up No follow-ups on file.   Total Time in Face-to-Face and in Coordination of Care for patient including independent/personal  interpretation/review of prior testing, medical history, examination, medication adjustment, communicating results with the patient directly, and documentation with the EHR is 45 minutes.   Justice Britain, MD [facility] Advanced Endoscopy Office # [phone]

## 2020-07-16 NOTE — Patient Instructions (Signed)
Your provider has requested that you go to the basement level for lab work before leaving today. Press "B" on the elevator. The lab is located at the first door on the left as you exit the elevator.  If you are age 76 or older, your body mass index should be between 23-30. Your Body mass index is 44.21 kg/m. If this is out of the aforementioned range listed, please consider follow up with your Primary Care Provider.  If you are age 47 or younger, your body mass index should be between 19-25. Your Body mass index is 44.21 kg/m. If this is out of the aformentioned range listed, please consider follow up with your Primary Care Provider.    You have been scheduled for an endoscopy. Please follow written instructions given to you at your visit today. If you use inhalers (even only as needed), please bring them with you on the day of your procedure.   You will be contaced by our office prior to your procedure for directions on holding your Plavix.  If you do not hear from our office 1 week prior to your scheduled procedure, please call [phone] to discuss.  Thank you for choosing me and [facility].  Dr. Rush Landmark

## 2020-07-16 NOTE — Telephone Encounter (Signed)
Letter sent. Thanks.

## 2020-07-16 NOTE — Telephone Encounter (Signed)
Dr.Ganji please advise:   Request for surgical clearance:     Endoscopy Procedure  What type of surgery is being performed?     EGD +EMR   When is this surgery scheduled?     09/13/2020  What type of clearance is required ?   Clearance to hold Plavix   Are there any medications that need to be held prior to surgery and how long? Plavix x 5 days prior to [facility] name and name of physician performing surgery?      [facility]-Dr.Mansouraty   What is your office phone and fax number?      Phone- [phone]  [phone]  Anesthesia type (None, local, MAC, general) ?       MAC

## 2020-07-19 DIAGNOSIS — D132 Benign neoplasm of duodenum: Secondary | ICD-10-CM | POA: Insufficient documentation

## 2020-07-19 DIAGNOSIS — R198 Other specified symptoms and signs involving the digestive system and abdomen: Secondary | ICD-10-CM | POA: Insufficient documentation

## 2020-07-19 DIAGNOSIS — Z7902 Long term (current) use of antithrombotics/antiplatelets: Secondary | ICD-10-CM | POA: Insufficient documentation

## 2020-07-19 NOTE — Telephone Encounter (Signed)
Pt's wife informed- ok to hold plavix 5 days prior to procedures. Wife voiced understanding.

## 2020-08-06 DIAGNOSIS — E782 Mixed hyperlipidemia: Secondary | ICD-10-CM | POA: Diagnosis not present

## 2020-08-06 DIAGNOSIS — Z0000 Encounter for general adult medical examination without abnormal findings: Secondary | ICD-10-CM | POA: Diagnosis not present

## 2020-08-06 DIAGNOSIS — R7303 Prediabetes: Secondary | ICD-10-CM | POA: Diagnosis not present

## 2020-08-06 DIAGNOSIS — I2510 Atherosclerotic heart disease of native coronary artery without angina pectoris: Secondary | ICD-10-CM | POA: Diagnosis not present

## 2020-08-06 DIAGNOSIS — I10 Essential (primary) hypertension: Secondary | ICD-10-CM | POA: Diagnosis not present

## 2020-08-17 DIAGNOSIS — E782 Mixed hyperlipidemia: Secondary | ICD-10-CM | POA: Diagnosis not present

## 2020-08-17 DIAGNOSIS — Z0000 Encounter for general adult medical examination without abnormal findings: Secondary | ICD-10-CM | POA: Diagnosis not present

## 2020-08-17 DIAGNOSIS — I2510 Atherosclerotic heart disease of native coronary artery without angina pectoris: Secondary | ICD-10-CM | POA: Diagnosis not present

## 2020-09-06 DIAGNOSIS — R1314 Dysphagia, pharyngoesophageal phase: Secondary | ICD-10-CM | POA: Diagnosis not present

## 2020-09-06 DIAGNOSIS — J383 Other diseases of vocal cords: Secondary | ICD-10-CM | POA: Diagnosis not present

## 2020-09-06 DIAGNOSIS — R490 Dysphonia: Secondary | ICD-10-CM | POA: Diagnosis not present

## 2020-09-06 DIAGNOSIS — R05 Cough: Secondary | ICD-10-CM | POA: Diagnosis not present

## 2020-09-06 DIAGNOSIS — J384 Edema of larynx: Secondary | ICD-10-CM | POA: Diagnosis not present

## 2020-09-09 ENCOUNTER — Other Ambulatory Visit (HOSPITAL_COMMUNITY)
Admission: RE | Admit: 2020-09-09 | Discharge: 2020-09-09 | Disposition: A | Discharge status: Home / Self Care | Payer: Medicare Other | Source: Ambulatory Visit | Attending: Gastroenterology | Admitting: Gastroenterology

## 2020-09-09 DIAGNOSIS — Z20822 Contact with and (suspected) exposure to covid-19: Secondary | ICD-10-CM | POA: Insufficient documentation

## 2020-09-09 DIAGNOSIS — Z01812 Encounter for preprocedural laboratory examination: Secondary | ICD-10-CM | POA: Diagnosis not present

## 2020-09-09 LAB — SARS CORONAVIRUS 2 (TAT 6-24 HRS): SARS Coronavirus 2: NEGATIVE

## 2020-09-10 ENCOUNTER — Encounter (HOSPITAL_COMMUNITY): Payer: Self-pay | Admitting: Gastroenterology

## 2020-09-10 NOTE — Progress Notes (Signed)
Spoke with Wife [name] for PAT information.    PCP - Dr Ashby Dawes Cardiologist - Dr Einar Gip  Chest x-ray - n/a EKG - 10/30/19 Stress Test - n/a ECHO - 04/12/18 Cardiac Cath - 10/28/10  Sleep Study - Yes CPAP - Uses nightly  Blood Thinner Instructions:  Last dose of plavix was on 09/08/20.  Aspirin Instructions: Follow your surgeon's instructions on when to stop aspirin prior to surgery,  If no instructions were given by your surgeon then you will need to call the office for those instructions.    Anesthesia review: Yes  STOP now taking any Aspirin (unless otherwise instructed by your surgeon), Aleve, Naproxen, Ibuprofen, Motrin, Advil, Goody's, BC's, all herbal medications, fish oil, and all vitamins.   Coronavirus Screening Covid test on 09/09/20 was negative.  Wife [name] verbalized understanding of instructions that were given via phone

## 2020-09-13 ENCOUNTER — Encounter (HOSPITAL_COMMUNITY)
Admission: RE | Disposition: A | Discharge status: Home / Self Care | Payer: Self-pay | Source: Home / Self Care | Attending: Gastroenterology

## 2020-09-13 ENCOUNTER — Ambulatory Visit (HOSPITAL_COMMUNITY): Payer: Medicare Other | Admitting: Physician Assistant

## 2020-09-13 ENCOUNTER — Other Ambulatory Visit: Payer: Self-pay | Admitting: Physician Assistant

## 2020-09-13 ENCOUNTER — Other Ambulatory Visit: Payer: Self-pay

## 2020-09-13 ENCOUNTER — Encounter (HOSPITAL_COMMUNITY): Payer: Self-pay | Admitting: Gastroenterology

## 2020-09-13 ENCOUNTER — Ambulatory Visit (HOSPITAL_COMMUNITY)
Admission: RE | Admit: 2020-09-13 | Discharge: 2020-09-13 | Disposition: A | Discharge status: Home / Self Care | Payer: Medicare Other | Attending: Gastroenterology | Admitting: Gastroenterology

## 2020-09-13 DIAGNOSIS — K2100 Gastro-esophageal reflux disease with esophagitis, without bleeding: Secondary | ICD-10-CM | POA: Insufficient documentation

## 2020-09-13 DIAGNOSIS — K2970 Gastritis, unspecified, without bleeding: Secondary | ICD-10-CM | POA: Diagnosis not present

## 2020-09-13 DIAGNOSIS — R7303 Prediabetes: Secondary | ICD-10-CM | POA: Insufficient documentation

## 2020-09-13 DIAGNOSIS — K225 Diverticulum of esophagus, acquired: Secondary | ICD-10-CM | POA: Diagnosis not present

## 2020-09-13 DIAGNOSIS — K869 Disease of pancreas, unspecified: Secondary | ICD-10-CM | POA: Insufficient documentation

## 2020-09-13 DIAGNOSIS — I2510 Atherosclerotic heart disease of native coronary artery without angina pectoris: Secondary | ICD-10-CM | POA: Diagnosis not present

## 2020-09-13 DIAGNOSIS — I10 Essential (primary) hypertension: Secondary | ICD-10-CM | POA: Insufficient documentation

## 2020-09-13 DIAGNOSIS — D132 Benign neoplasm of duodenum: Secondary | ICD-10-CM

## 2020-09-13 DIAGNOSIS — R933 Abnormal findings on diagnostic imaging of other parts of digestive tract: Secondary | ICD-10-CM | POA: Insufficient documentation

## 2020-09-13 DIAGNOSIS — K839 Disease of biliary tract, unspecified: Secondary | ICD-10-CM | POA: Diagnosis not present

## 2020-09-13 DIAGNOSIS — K209 Esophagitis, unspecified without bleeding: Secondary | ICD-10-CM

## 2020-09-13 DIAGNOSIS — Z888 Allergy status to other drugs, medicaments and biological substances status: Secondary | ICD-10-CM | POA: Insufficient documentation

## 2020-09-13 DIAGNOSIS — K319 Disease of stomach and duodenum, unspecified: Secondary | ICD-10-CM | POA: Diagnosis present

## 2020-09-13 DIAGNOSIS — Z955 Presence of coronary angioplasty implant and graft: Secondary | ICD-10-CM | POA: Diagnosis not present

## 2020-09-13 DIAGNOSIS — G4733 Obstructive sleep apnea (adult) (pediatric): Secondary | ICD-10-CM | POA: Diagnosis not present

## 2020-09-13 DIAGNOSIS — Z85038 Personal history of other malignant neoplasm of large intestine: Secondary | ICD-10-CM | POA: Diagnosis not present

## 2020-09-13 DIAGNOSIS — K8020 Calculus of gallbladder without cholecystitis without obstruction: Secondary | ICD-10-CM | POA: Insufficient documentation

## 2020-09-13 DIAGNOSIS — R198 Other specified symptoms and signs involving the digestive system and abdomen: Secondary | ICD-10-CM

## 2020-09-13 DIAGNOSIS — D135 Benign neoplasm of extrahepatic bile ducts: Secondary | ICD-10-CM | POA: Diagnosis not present

## 2020-09-13 DIAGNOSIS — K317 Polyp of stomach and duodenum: Secondary | ICD-10-CM

## 2020-09-13 DIAGNOSIS — K2950 Unspecified chronic gastritis without bleeding: Secondary | ICD-10-CM | POA: Diagnosis not present

## 2020-09-13 DIAGNOSIS — K449 Diaphragmatic hernia without obstruction or gangrene: Secondary | ICD-10-CM | POA: Diagnosis not present

## 2020-09-13 HISTORY — PX: ESOPHAGOGASTRODUODENOSCOPY (EGD) WITH PROPOFOL: SHX5813

## 2020-09-13 HISTORY — PX: BIOPSY: SHX5522

## 2020-09-13 HISTORY — PX: EUS: SHX5427

## 2020-09-13 LAB — COMPREHENSIVE METABOLIC PANEL
ALT: 47 U/L — ABNORMAL HIGH (ref 0–44)
AST: 29 U/L (ref 15–41)
Albumin: 3 g/dL — ABNORMAL LOW (ref 3.5–5.0)
Alkaline Phosphatase: 80 U/L (ref 38–126)
Anion gap: 11 (ref 5–15)
BUN: 17 mg/dL (ref 8–23)
CO2: 23 mmol/L (ref 22–32)
Calcium: 8.6 mg/dL — ABNORMAL LOW (ref 8.9–10.3)
Chloride: 104 mmol/L (ref 98–111)
Creatinine, Ser: 0.82 mg/dL (ref 0.61–1.24)
GFR calc Af Amer: >60 mL/min (ref >60)
GFR calc non Af Amer: >60 mL/min (ref >60)
Glucose, Bld: 99 mg/dL (ref 70–99)
Potassium: 3.5 mmol/L (ref 3.5–5.1)
Sodium: 138 mmol/L (ref 135–145)
Total Bilirubin: 0.9 mg/dL (ref 0.3–1.2)
Total Protein: 6.1 g/dL — ABNORMAL LOW (ref 6.5–8.1)

## 2020-09-13 SURGERY — ESOPHAGOGASTRODUODENOSCOPY (EGD) WITH PROPOFOL
Anesthesia: Monitor Anesthesia Care

## 2020-09-13 MED ORDER — SUCCINYLCHOLINE CHLORIDE 200 MG/10ML IV SOSY
PREFILLED_SYRINGE | INTRAVENOUS | Status: DC | PRN
  Administered 2020-09-13: 180 mg via INTRAVENOUS

## 2020-09-13 MED ORDER — ROCURONIUM BROMIDE 10 MG/ML (PF) SYRINGE
PREFILLED_SYRINGE | INTRAVENOUS | Status: DC | PRN
  Administered 2020-09-13: 20 mg via INTRAVENOUS
  Administered 2020-09-13: 30 mg via INTRAVENOUS
  Administered 2020-09-13: 20 mg via INTRAVENOUS

## 2020-09-13 MED ORDER — PROPOFOL 10 MG/ML IV BOLUS
INTRAVENOUS | Status: DC | PRN
  Administered 2020-09-13: 130 mg via INTRAVENOUS

## 2020-09-13 MED ORDER — LACTATED RINGERS IV SOLN: INTRAVENOUS | Status: DC | PRN

## 2020-09-13 MED ORDER — ONDANSETRON HCL 4 MG/2ML IJ SOLN
INTRAMUSCULAR | Status: DC | PRN
  Administered 2020-09-13: 4 mg via INTRAVENOUS

## 2020-09-13 MED ORDER — SUGAMMADEX SODIUM 200 MG/2ML IV SOLN
INTRAVENOUS | Status: DC | PRN
  Administered 2020-09-13: 280 mg via INTRAVENOUS

## 2020-09-13 MED ORDER — SODIUM CHLORIDE 0.9 % IV SOLN: INTRAVENOUS | Status: DC

## 2020-09-13 MED ORDER — GLYCOPYRROLATE 0.2 MG/ML IJ SOLN
INTRAMUSCULAR | Status: DC | PRN
  Administered 2020-09-13: .1 mg via INTRAVENOUS

## 2020-09-13 MED ORDER — FENTANYL CITRATE (PF) 250 MCG/5ML IJ SOLN
INTRAMUSCULAR | Status: DC | PRN
Start: 2020-09-13 — End: 2020-09-13
  Administered 2020-09-13: 25 ug via INTRAVENOUS
  Administered 2020-09-13: 50 ug via INTRAVENOUS
  Administered 2020-09-13: 25 ug via INTRAVENOUS

## 2020-09-13 MED ORDER — LIDOCAINE 2% (20 MG/ML) 5 ML SYRINGE
INTRAMUSCULAR | Status: DC | PRN
  Administered 2020-09-13: 100 mg via INTRAVENOUS

## 2020-09-13 MED ORDER — EPHEDRINE SULFATE-NACL 50-0.9 MG/10ML-% IV SOSY
PREFILLED_SYRINGE | INTRAVENOUS | Status: DC | PRN
  Administered 2020-09-13 (×3): 10 mg via INTRAVENOUS
  Administered 2020-09-13: 5 mg via INTRAVENOUS
  Administered 2020-09-13: 15 mg via INTRAVENOUS

## 2020-09-13 MED ORDER — PHENYLEPHRINE 40 MCG/ML (10ML) SYRINGE FOR IV PUSH (FOR BLOOD PRESSURE SUPPORT)
PREFILLED_SYRINGE | INTRAVENOUS | Status: DC | PRN
  Administered 2020-09-13: 80 ug via INTRAVENOUS
  Administered 2020-09-13 (×2): 120 ug via INTRAVENOUS
  Administered 2020-09-13: 80 ug via INTRAVENOUS

## 2020-09-13 MED ORDER — OMEPRAZOLE 40 MG PO CPDR: 40.0000 mg | DELAYED_RELEASE_CAPSULE | Freq: Every day | ORAL | 3 refills | Status: DC

## 2020-09-13 MED ORDER — PHENYLEPHRINE HCL-NACL 10-0.9 MG/250ML-% IV SOLN
INTRAVENOUS | Status: DC | PRN
  Administered 2020-09-13: 50 ug/min via INTRAVENOUS

## 2020-09-13 SURGICAL SUPPLY — 15 items

## 2020-09-13 NOTE — Anesthesia Postprocedure Evaluation (Signed)
Anesthesia Post Note  Patient: Nowell Sites  Procedure(s) Performed: ESOPHAGOGASTRODUODENOSCOPY (EGD) WITH PROPOFOL (N/A ) BIOPSY UPPER ENDOSCOPIC ULTRASOUND (EUS) LINEAR UPPER ENDOSCOPIC ULTRASOUND (EUS) RADIAL     Patient location during evaluation: PACU Anesthesia Type: General Level of consciousness: awake and alert Pain management: pain level controlled Vital Signs Assessment: post-procedure vital signs reviewed and stable Respiratory status: spontaneous breathing, nonlabored ventilation and respiratory function stable Cardiovascular status: blood pressure returned to baseline and stable Postop Assessment: no apparent nausea or vomiting Anesthetic complications: no   No complications documented.  Last Vitals:  Vitals:   09/13/20 1319 09/13/20 1329  BP: (!) 104/32 (!) 106/37  Pulse: (!) 58 (!) 57  Resp: 18 16  Temp:    SpO2: 96% 94%    Last Pain:  Vitals:   09/13/20 1329  TempSrc:   PainSc: 0-No pain                 Audry Pili

## 2020-09-13 NOTE — Anesthesia Procedure Notes (Signed)
Procedure Name: Intubation Date/Time: 09/13/2020 11:21 AM Performed by: Kathryne Hitch, CRNA Pre-anesthesia Checklist: Patient identified, Emergency Drugs available, Suction available, Patient being monitored and Timeout performed Patient Re-evaluated:Patient Re-evaluated prior to induction Oxygen Delivery Method: Circle system utilized Preoxygenation: Pre-oxygenation with 100% oxygen Induction Type: IV induction, Rapid sequence and Cricoid Pressure applied Laryngoscope Size: Mac and 4 Grade View: Grade II Tube type: Oral Tube size: 7.5 mm Number of attempts: 1 Airway Equipment and Method: Stylet Placement Confirmation: ETT inserted through vocal cords under direct vision,  positive ETCO2 and breath sounds checked- equal and bilateral Secured at: 23 cm Tube secured with: Tape Dental Injury: Teeth and Oropharynx as per pre-operative assessment

## 2020-09-13 NOTE — Transfer of Care (Signed)
Immediate Anesthesia Transfer of Care Note  Patient: Justin Davidson  Procedure(s) Performed: ESOPHAGOGASTRODUODENOSCOPY (EGD) WITH PROPOFOL (N/A ) ENDOSCOPIC MUCOSAL RESECTION (N/A ) BIOPSY UPPER ENDOSCOPIC ULTRASOUND (EUS) LINEAR  Patient Location: Endoscopy Unit  Anesthesia Type:MAC  Level of Consciousness: drowsy and patient cooperative  Airway & Oxygen Therapy: Patient Spontanous Breathing  Post-op Assessment: Report given to RN and Post -op Vital signs reviewed and stable  Post vital signs: Reviewed and stable  Last Vitals:  Vitals Value Taken Time  BP 136/40 09/13/20 1249  Temp 36.2 C 09/13/20 1249  Pulse 61 09/13/20 1254  Resp 18 09/13/20 1254  SpO2 92 % 09/13/20 1254  Vitals shown include unvalidated device data.  Last Pain:  Vitals:   09/13/20 1249  TempSrc: Temporal  PainSc: 0-No pain         Complications: No complications documented.

## 2020-09-13 NOTE — Anesthesia Preprocedure Evaluation (Addendum)
Anesthesia Evaluation  Patient identified by MRN, date of birth, ID band Patient awake    Reviewed: Allergy & Precautions, NPO status , Patient's Chart, lab work & pertinent test results, reviewed documented beta blocker date and time   Airway Mallampati: I  TM Distance: >3 FB Neck ROM: Full    Dental  (+) Caps, Missing, Poor Dentition   Pulmonary sleep apnea and Continuous Positive Airway Pressure Ventilation ,    Pulmonary exam normal breath sounds clear to auscultation       Cardiovascular hypertension, Pt. on medications and Pt. on home beta blockers + CAD and + DOE  Normal cardiovascular exam Rhythm:Regular Rate:Normal  EKG reviewed   Neuro/Psych Anxiety Dementia negative neurological ROS     GI/Hepatic Neg liver ROS, GERD  ,Duodenal polyp   Endo/Other  Morbid obesityHyperlipidemia  Renal/GU negative Renal ROS   BPH    Musculoskeletal negative musculoskeletal ROS (+)   Abdominal (+) + obese,   Peds  Hematology Plavix therapy- last dose 04/14/20   Anesthesia Other Findings   Reproductive/Obstetrics                            Anesthesia Physical  Anesthesia Plan  ASA: III  Anesthesia Plan: General   Post-op Pain Management:    Induction: Intravenous  PONV Risk Score and Plan: 2 and Ondansetron, Treatment may vary due to age or medical condition and Dexamethasone  Airway Management Planned: Oral ETT  Additional Equipment: None  Intra-op Plan:   Post-operative Plan: Extubation in OR  Informed Consent: I have reviewed the patients History and Physical, chart, labs and discussed the procedure including the risks, benefits and alternatives for the proposed anesthesia with the patient or authorized representative who has indicated his/her understanding and acceptance.     Dental advisory given  Plan Discussed with: CRNA, Surgeon and Anesthesiologist  Anesthesia Plan  Comments: (Endoscopist has requested intubation due to need to resect duodenal mass. )       Anesthesia Quick Evaluation

## 2020-09-13 NOTE — Discharge Instructions (Signed)
YOU HAD AN ENDOSCOPIC PROCEDURE TODAY: Refer to the procedure report and other information in the discharge instructions given to you for any specific questions about what was found during the examination. If this information does not answer your questions, please call [place] office at [phone] to clarify.   YOU SHOULD EXPECT: Some feelings of bloating in the abdomen. Passage of more gas than usual. Walking can help get rid of the air that was put into your GI tract during the procedure and reduce the bloating. If you had a lower endoscopy (such as a colonoscopy or flexible sigmoidoscopy) you may notice spotting of blood in your stool or on the toilet paper. Some abdominal soreness may be present for a day or two, also.  DIET: Your first meal following the procedure should be a light meal and then it is ok to progress to your normal diet. A half-sandwich or bowl of soup is an example of a good first meal. Heavy or fried foods are harder to digest and may make you feel nauseous or bloated. Drink plenty of fluids but you should avoid alcoholic beverages for 24 hours. If you had a esophageal dilation, please see attached instructions for diet.    ACTIVITY: Your care partner should take you home directly after the procedure. You should plan to take it easy, moving slowly for the rest of the day. You can resume normal activity the day after the procedure however YOU SHOULD NOT DRIVE, use power tools, machinery or perform tasks that involve climbing or major physical exertion for 24 hours (because of the sedation medicines used during the test).   SYMPTOMS TO REPORT IMMEDIATELY: A gastroenterologist can be reached at any hour. Please call [phone]  for any of the following symptoms:   Following upper endoscopy (EGD, EUS, ERCP, esophageal dilation) Vomiting of blood or coffee ground material  New, significant abdominal pain  New, significant chest pain or pain under the shoulder blades  Painful or  persistently difficult swallowing  New shortness of breath  Black, tarry-looking or red, bloody stools  FOLLOW UP:  If any biopsies were taken you will be contacted by phone or by letter within the next 1-3 weeks. Call [phone]  if you have not heard about the biopsies in 3 weeks.  Please also call with any specific questions about appointments or follow up tests.  

## 2020-09-13 NOTE — H&P (Signed)
GASTROENTEROLOGY PROCEDURE H&P NOTE   Primary Care Physician: Merrilee Seashore, MD  HPI: Justin Davidson is a 74 y.o. 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  Past Medical History:  Diagnosis Date  . Anxiety   . Colon cancer ([place])   . Colon polyps   . Coronary artery disease   . Dementia ([place])   . Dizziness and giddiness   . GERD (gastroesophageal reflux disease)   . Hypercholesteremia   . Hyperglycemia   . Hypertension   . OSA (obstructive sleep apnea)    CPAP   . Prediabetes    diet controlled   Past Surgical History:  Procedure Laterality Date  . cardaic stents    . CARDIAC CATHETERIZATION  10/28/2010  . COLONOSCOPY    . ESOPHAGOGASTRODUODENOSCOPY (EGD) WITH PROPOFOL N/A 04/19/2020   Procedure: ESOPHAGOGASTRODUODENOSCOPY (EGD) WITH PROPOFOL;  Surgeon: Clarene Essex, MD;  Location: [facility];  Service: Endoscopy;  Laterality: N/A;  . HOT HEMOSTASIS N/A 04/19/2020   Procedure: HOT HEMOSTASIS (ARGON PLASMA COAGULATION/BICAP);  Surgeon: Clarene Essex, MD;  Location: [facility];  Service: Endoscopy;  Laterality: N/A;  . PARTIAL COLECTOMY  1996   colon cancer   Current Facility-Administered Medications  Medication Dose Route Frequency Provider Last Rate Last Admin  . 0.9 %  sodium chloride infusion   Intravenous Continuous Mansouraty, Telford Nab., MD       Allergies  Allergen Reactions  . Lisinopril Cough   Family History  Problem Relation Age of Onset  . Stroke Mother   . Diabetes Mother   . Hypertension Father   . Hyperlipidemia Father   . Seizures Father   . Diabetes Father   . Hypertension Sister   . Hyperlipidemia Sister   . Heart disease Paternal Grandfather   . Diabetes Paternal Grandfather   . GER disease Daughter   . Colon cancer Neg Hx   . Esophageal cancer Neg Hx   . Inflammatory bowel disease Neg Hx   . Liver disease Neg Hx   . Pancreatic cancer Neg Hx   . Rectal cancer Neg Hx    . Stomach cancer Neg Hx    Social History   Socioeconomic History  . Marital status: Married    Spouse name: Not on file  . Number of children: 3  . Years of education: Not on file  . Highest education level: Not on file  Occupational History  . Occupation: retired  Tobacco Use  . Smoking status: Never Smoker  . Smokeless tobacco: Never Used  Vaping Use  . Vaping Use: Never used  Substance and Sexual Activity  . Alcohol use: Yes    Comment: occasional  . Drug use: Not Currently  . Sexual activity: Not on file  Other Topics Concern  . Not on file  Social History Narrative  . Not on file   Social Determinants of Health   Financial Resource Strain:   . Difficulty of Paying Living Expenses: Not on file  Food Insecurity:   . Worried About Charity fundraiser in the Last Year: Not on file  . Ran Out of Food in the Last Year: Not on file  Transportation Needs:   . Lack of Transportation (Medical): Not on file  . Lack of Transportation (Non-Medical): Not on file  Physical Activity:   . Days of Exercise per Week: Not on file  . Minutes of Exercise per Session: Not on file  Stress:   . Feeling of  Stress : Not on file  Social Connections:   . Frequency of Communication with Friends and Family: Not on file  . Frequency of Social Gatherings with Friends and Family: Not on file  . Attends Religious Services: Not on file  . Active Member of Clubs or Organizations: Not on file  . Attends Archivist Meetings: Not on file  . Marital Status: Not on file  Intimate Partner Violence:   . Fear of Current or Ex-Partner: Not on file  . Emotionally Abused: Not on file  . Physically Abused: Not on file  . Sexually Abused: Not on file    Physical Exam: Vital signs in last 24 hours: Temp:  [97.9 F (36.6 C)] 97.9 F (36.6 C) (10/04 1035) Pulse Rate:  [57] 57 (10/04 1035) Resp:  [21] 21 (10/04 1035) BP: (146)/(60) 146/60 (10/04 1035) SpO2:  [96 %] 96 % (10/04  1035) Weight:  [138.3 kg] 138.3 kg (10/04 1035)   GEN: NAD EYE: Sclerae anicteric ENT: MMM CV: Non-tachycardic GI: Soft, NT/ND NEURO:  Alert & Oriented x 3  Lab Results: No results for input(s): WBC, HGB, HCT, PLT in the last 72 hours. BMET No results for input(s): NA, K, CL, CO2, GLUCOSE, BUN, CREATININE, CALCIUM in the last 72 hours. LFT No results for input(s): PROT, ALBUMIN, AST, ALT, ALKPHOS, BILITOT, BILIDIR, IBILI in the last 72 hours. PT/INR No results for input(s): LABPROT, INR in the last 72 hours.   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  The risks of an EUS including intestinal perforation, bleeding, infection, aspiration, and medication effects were discussed as was the possibility it may not give a definitive diagnosis if a biopsy is performed.  When a biopsy of the pancreas is done as part of the EUS, there is an additional risk of pancreatitis at the rate of about 1-2%.  It was explained that procedure related pancreatitis is typically mild, although it can be severe and even life threatening, which is why we do not perform random pancreatic biopsies and only biopsy a lesion/area we feel is concerning enough to warrant the risk.  Should the area involve the true ampulla, we will not be able to proceed with ERCP ampullectomy today due to time/scheduling but also with need for further evaluation of the biliary/pancreatic ducts with dedicated cross-sectional imaging.  The risks and benefits of endoscopic evaluation were discussed with the patient; these include but are not limited to the risk of perforation, infection, bleeding, missed lesions, lack of diagnosis, severe illness requiring hospitalization, as well as anesthesia and sedation related illnesses.  The patient and wife are agreeable to proceed.    Justice Britain, MD [facility] Advanced  Endoscopy Office # [phone]

## 2020-09-13 NOTE — Op Note (Signed)
[facility] Patient Name: Justin Davidson Procedure Date : 09/13/2020 MRN: [id number] Attending MD: Justice Britain , MD Date of Birth: 1946-01-17 CSN: [id number] Age: 74 Admit Type: Outpatient Procedure:                Upper EUS Indications:              Duodenal mucosal mass/polyp found on endoscopy,                            Mucosal mass/polyp involving the major papilla                            (found on endoscopy) Providers:                Justice Britain, MD, Burtis Junes, RN, Elspeth Cho Tech., Technician Referring MD:              Medicines:                General Anesthesia Complications:            No immediate complications. Estimated Blood Loss:     Estimated blood loss was minimal. Procedure:                Pre-Anesthesia Assessment:                           - Prior to the procedure, a History and Physical                            was performed, and patient medications and                            allergies were reviewed. The patient's tolerance of                            previous anesthesia was also reviewed. The risks                            and benefits of the procedure and the sedation                            options and risks were discussed with the patient.                            All questions were answered, and informed consent                            was obtained. Prior Anticoagulants: The patient has                            taken Plavix (clopidogrel), last dose was 5 days                            prior  to procedure. ASA Grade Assessment: III - A                            patient with severe systemic disease. After                            reviewing the risks and benefits, the patient was                            deemed in satisfactory condition to undergo the                            procedure.                           After obtaining informed consent, the endoscope was                             passed under direct vision. Throughout the                            procedure, the patient's blood pressure, pulse, and                            oxygen saturations were monitored continuously. The                            [id number]-H190 ([id number]) Olympus gastroscope was                            introduced through the mouth, and advanced to the                            second part of duodenum. The TJF- Q180V ([id number])                            Olympus duodenoscope was introduced through the                            mouth, and advanced to the second part of duodenum.                            After obtaining informed consent, the endoscope was                            passed under direct vision. Throughout the                            procedure, the patient's blood pressure, pulse, and                            oxygen saturations were monitored continuously. The  GF-UE160-AL5 ([id number]) Olympus Radial EUS scope was                            introduced through the mouth, and advanced to the                            duodenum for ultrasound examination. The upper EUS                            was accomplished without difficulty. The patient                            tolerated the procedure. The GF-[id number] ([id number])                            Olympus Linear EUS was introduced through the                            mouth, and advanced to the duodenum for ultrasound                            examination from the stomach and duodenum. 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 To allow passage of       the endoscopes and echoendoscopes, I passed a wire into the stomach to       allow passage of scopes with better ease.      No gross lesions were noted in the proximal esophagus and in the mid        esophagus.      LA Grade B (one or more mucosal breaks greater than 5 mm, not extending       between the tops of two mucosal folds) esophagitis with no bleeding was       found in the distal esophagus.      A 3 cm hiatal hernia was present.      Patchy moderate inflammation characterized by erosions, erythema and       granularity was found in the gastric body, at the incisura and in the       gastric antrum.      No other gross lesions were noted in the entire examined stomach.       Biopsies were taken with a cold forceps for histology and Helicobacter       pylori testing.      No gross lesions were noted in the duodenal bulb.      A single at least 70 mm pedunculated and sessile polyp with no bleeding       was found at the ampulla with extension laterally as well. Clear       definition of the ampullary orifice was not able to be seen and no bile       or pancreatic juices were noted to be coming from the region. Using a       sphincterotome I tried to see how and where the biliary tree would be or  if it was hidden under a fold but could not visualize. Biopsies were       taken with a cold forceps for histology.      ENDOSONOGRAPHIC FINDING: :      A hypoechoic pedunculated mass-lesion was identified endosonographically       in the ampulla. The mass measured 23 mm by 27 mm in cross-sectional       diameter but overall was greater than 7 cm in length. The lesion       extended from the mucosa to the muscularis mucosa. The outer margins       were smooth. I did not see involvement of the muscularis propria.      There was dilation in the common bile duct (4.2 mm -> 5.9 mm) and in the       common hepatic duct (7.3 mm -> 8.7 mm).      Moderate hyperechoic material consistent with sludge and small stones       were visualized endosonographically in the gallbladder.      The pancreatic duct had a dilated endosonographic appearance in the       pancreatic head (3.9 mm -> 2.1  mm). The pancreatic duct in the neck       measured 1.7 mm. The pancreatic duct in the body measured 0.9 mm. The       pancreatic duct in the tail measured 1.0 mm.      Pancreatic parenchymal abnormalities were noted in the entire pancreas.       These consisted of lobularity with honeycombing and hyperechoic strands.      Endosonographic imaging in the visualized portion of the liver showed no       mass.      No malignant-appearing lymph nodes were visualized in the celiac region       (level 20), perigastric region and peripancreatic region.      The celiac region was visualized. Impression:               EGD Impression:                           - Zenker's diverticulum.                           - No gross lesions in esophagus proximally.                           - LA Grade B esophagitis with no bleeding distally.                           - 3 cm hiatal hernia.                           - Gastritis. No other gross lesions in the stomach.                            Biopsied.                           - No gross lesions in the duodenal bulb.                           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                            Biopsied.                           EUS Impression:                           - A lesion was found in the ampulla. A tissue                            diagnosis was obtained prior to this exam. This is                            consistent with an adenoma based on prior biopsies.                            Did not see overt invasion into the biliary duct or                            pancreatic duct, but dilitation was noted                            endoscopically as noted below..                           - There was dilation in the common bile duct and in                            the common hepatic duct.                           - Hyperechoic material consistent  with sludge and                            small stones was visualized endosonographically in                            the gallbladder.                           - The pancreatic duct had a dilated endosonographic                            appearance in the pancreatic head but otherwise was                            normal in size..                           - Pancreatic parenchymal abnormalities consisting  of lobularity with honeycombing and hyperechoic                            strands were noted in the entire pancreas.                            Suggestive of chronic pancreatitis but                            not-diagnostic.                           - No malignant-appearing lymph nodes were                            visualized in the celiac region (level 20),                            perigastric region and peripancreatic region. Recommendation:           - The patient will be observed post-procedure,                            until all discharge criteria are met.                           - Discharge patient to home.                           - Patient has a contact number available for                            emergencies. The signs and symptoms of potential                            delayed complications were discussed with the                            patient. Return to normal activities tomorrow.                            Written discharge instructions were provided to the                            patient.                           - May restart Plavix in 24 hours (10/5) to decrease                            risk of post-intervention bleeding.                           - Observe patient's clinical course.                           -  Await path results.                           - Start Omeprazole 40 mg daily.                           - Would obtain a MRI/MRCP with IV contrast vs                            Pancreas Protocol CTAP to  further delineate and                            define pancreatic and biliary duct anatomy. Based                            on today's evaluation, I think endoscopic resection                            could be possible, but this will be quite difficult                            and also higher risk for recurrence potentially.                           - Recommend evaluation with Hepatobiliary surgery                            to discuss potential role of Pancreas Preserving                            Ampullectomy vs repeat endoscopic approach.                           - Will discuss with primary GI whether we should                            proceed with attempt here vs [facility] for                            Ampullectomy + ERCP with biliary/pancreatic                            stenting.                           - The findings and recommendations were discussed                            with the patient.                           - The findings and recommendations were discussed                            with the patient's family. Procedure Code(s):        ---  Professional ---                           [phone], Esophagogastroduodenoscopy, flexible,                            transoral; with endoscopic ultrasound examination                            limited to the esophagus, stomach or duodenum, and                            adjacent structures                           43239, Esophagogastroduodenoscopy, flexible,                            transoral; with biopsy, single or multiple Diagnosis Code(s):        --- Professional ---                           K22.5, Diverticulum of esophagus, acquired                           K20.90, Esophagitis, unspecified without bleeding                           K44.9, Diaphragmatic hernia without obstruction or                            gangrene                           K29.70, Gastritis, unspecified, without bleeding                            K31.7, Polyp of stomach and duodenum                           K83.8, Other specified diseases of biliary tract                           K86.9, Disease of pancreas, unspecified                           I89.9, Noninfective disorder of lymphatic vessels                            and lymph nodes, unspecified                           K31.89, Other diseases of stomach and duodenum                           K83.9, Disease of biliary tract, unspecified  R93.3, Abnormal findings on diagnostic imaging of                            other parts of digestive tract CPT copyright 2019 American [employer]. All rights reserved. The codes documented in this report are preliminary and upon coder review may  be revised to meet current compliance requirements. Justice Britain, MD 09/13/2020 1:07:56 PM Number of Addenda: 0

## 2020-09-14 ENCOUNTER — Telehealth: Payer: Self-pay | Admitting: Gastroenterology

## 2020-09-14 LAB — SURGICAL PATHOLOGY

## 2020-09-14 NOTE — Telephone Encounter (Signed)
I called and spoke with the patient's wife and the patient this afternoon about the results and after 10 minutes of discussion with them they asked that I go ahead and speak with the patient's daughter who I proceeded with speaking with.  I was on the phone with her for an additional 20 minutes. In total this was a 30-minute phone conversation.  The patient and family are aware that his biopsies have returned. He has evidence of H. pylori gastritis. He will need treatment as follows: 1) PPI 40 mg twice daily 2) Pylera x10 days or individual ingredients x10 days 3) proceed with PPI therapy 40 mg once daily after completion of Pylera for 1 month and then maintain with consideration of fecal stool antigen testing being done at some point in the near future versus repeat endoscopic biopsies.  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  We discussed the role of next steps in evaluation to better decide whether attempt at endoscopic resection should be considered versus more likely surgical management.  We will move forward with the plan of action as follows: 1) proceed with scheduling MRI/MRCP abdomen to better define the hepatobiliary and pancreatic ducts 2) Place referral to hepatobiliary surgeons at [facility] (Dr. Barry Dienes and Dr. Zenia Resides) in regards to role of surgical management of this large mass/lesion 3) Place referral for another opinion to hepatobiliary surgeons at [facility] (Dr. Dossie Der, Dr. Mariah Milling, Dr. Zenia Resides) 4) referrals will be placed in semiurgent status because the MRI/MRCP needs to be completed before overt scheduling will likely occur but still helpful 5) after the completion of this work-up with the MRI imaging I will discuss with the patient and family once again whether we should consider an endoscopic approach but I think surgical approach at resection probably is most likely but we will see how the results show  The  patient's daughter is greatly appreciative for the care her dad has received with Dr. Watt Climes and through [place] even in the short time.  She wants to get things moving as quickly as possible and certainly we will try to move things along as best we can with hopefully getting the MRI in the next couple of weeks and getting him treated for his H. pylori.  I will forward this to the patient's primary gastroenterologist.  Justice Britain, MD [facility] Advanced [facility]oscopy Office # [phone]

## 2020-09-15 ENCOUNTER — Encounter (HOSPITAL_COMMUNITY): Payer: Self-pay | Admitting: Gastroenterology

## 2020-09-15 ENCOUNTER — Other Ambulatory Visit: Payer: Self-pay

## 2020-09-15 DIAGNOSIS — A048 Other specified bacterial intestinal infections: Secondary | ICD-10-CM

## 2020-09-15 MED ORDER — METRONIDAZOLE 500 MG PO TABS: 500.0000 mg | ORAL_TABLET | Freq: Two times a day (BID) | ORAL | 0 refills | Status: AC

## 2020-09-15 MED ORDER — OMEPRAZOLE 40 MG PO CPDR: 40.0000 mg | DELAYED_RELEASE_CAPSULE | Freq: Two times a day (BID) | ORAL | 11 refills | Status: DC

## 2020-09-15 MED ORDER — AMOXICILLIN 500 MG PO TABS: 1000.0000 mg | ORAL_TABLET | Freq: Two times a day (BID) | ORAL | 0 refills | Status: AC

## 2020-09-15 MED ORDER — CLARITHROMYCIN 500 MG PO TABS: 500.0000 mg | ORAL_TABLET | Freq: Two times a day (BID) | ORAL | 0 refills | Status: AC

## 2020-09-15 NOTE — Telephone Encounter (Signed)
The pt wife has been advised and all questions answered.  She verbalized understanding and will call if she has not heard from [facility] in 1 week. She will pick up stool test to be completed as instructed.

## 2020-09-15 NOTE — Telephone Encounter (Signed)
Pylera components sent to the pharmacy MRI scheduled for 09/20/20 at 7 pm to arrive at 630 pm nothing to eat or drink 4 hours prior  Referral to [facility] made  Stool antigen entered  Omeprazole prescription increased to twice daily for 1 month.  Pt to stop PPI for 2 weeks then have stool testing.

## 2020-09-15 NOTE — Telephone Encounter (Signed)
Received a message from the pt wife and was asked to call back at the same number. Called and line rings no voice mail will try later

## 2020-09-15 NOTE — Telephone Encounter (Signed)
Information routed to PCP.

## 2020-09-15 NOTE — Telephone Encounter (Signed)
Left message on machine to call back  

## 2020-09-16 ENCOUNTER — Encounter: Payer: Self-pay | Admitting: Gastroenterology

## 2020-09-20 ENCOUNTER — Other Ambulatory Visit: Payer: Self-pay | Admitting: Gastroenterology

## 2020-09-20 ENCOUNTER — Ambulatory Visit (HOSPITAL_COMMUNITY)
Admission: RE | Admit: 2020-09-20 | Discharge: 2020-09-20 | Disposition: A | Discharge status: Home / Self Care | Payer: Medicare Other | Source: Ambulatory Visit | Attending: Gastroenterology | Admitting: Gastroenterology

## 2020-09-20 DIAGNOSIS — A048 Other specified bacterial intestinal infections: Secondary | ICD-10-CM | POA: Insufficient documentation

## 2020-09-20 DIAGNOSIS — K8020 Calculus of gallbladder without cholecystitis without obstruction: Secondary | ICD-10-CM | POA: Diagnosis not present

## 2020-09-20 MED ORDER — GADOBUTROL 1 MMOL/ML IV SOLN
10.0000 mL | Freq: Once | INTRAVENOUS | Status: AC | PRN
  Administered 2020-09-20: 10 mL via INTRAVENOUS

## 2020-09-23 ENCOUNTER — Telehealth: Payer: Self-pay | Admitting: Gastroenterology

## 2020-09-23 NOTE — Telephone Encounter (Signed)
I spoke with the patient's wife.  All questions about medications answered.  She will call back for additional questions or concerns

## 2020-09-27 ENCOUNTER — Other Ambulatory Visit: Payer: Self-pay

## 2020-09-27 DIAGNOSIS — N281 Cyst of kidney, acquired: Secondary | ICD-10-CM

## 2020-09-27 DIAGNOSIS — N170 Acute kidney failure with tubular necrosis: Secondary | ICD-10-CM

## 2020-09-27 DIAGNOSIS — D132 Benign neoplasm of duodenum: Secondary | ICD-10-CM

## 2020-09-28 ENCOUNTER — Ambulatory Visit: Payer: Medicare Other | Admitting: Urology

## 2020-09-29 DIAGNOSIS — D49512 Neoplasm of unspecified behavior of left kidney: Secondary | ICD-10-CM | POA: Diagnosis not present

## 2020-10-05 ENCOUNTER — Other Ambulatory Visit: Payer: Self-pay | Admitting: Cardiology

## 2020-10-05 DIAGNOSIS — I10 Essential (primary) hypertension: Secondary | ICD-10-CM

## 2020-10-06 ENCOUNTER — Other Ambulatory Visit (INDEPENDENT_AMBULATORY_CARE_PROVIDER_SITE_OTHER): Payer: Medicare Other

## 2020-10-06 DIAGNOSIS — D132 Benign neoplasm of duodenum: Secondary | ICD-10-CM | POA: Diagnosis not present

## 2020-10-06 LAB — HEPATIC FUNCTION PANEL
ALT: 30 U/L (ref 0–53)
AST: 21 U/L (ref 0–37)
Albumin: 3.6 g/dL (ref 3.5–5.2)
Alkaline Phosphatase: 98 U/L (ref 39–117)
Bilirubin, Direct: 0.1 mg/dL (ref 0.0–0.3)
Total Bilirubin: 0.7 mg/dL (ref 0.2–1.2)
Total Protein: 6.7 g/dL (ref 6.0–8.3)

## 2020-10-07 ENCOUNTER — Other Ambulatory Visit: Payer: Medicare Other

## 2020-10-07 DIAGNOSIS — G4733 Obstructive sleep apnea (adult) (pediatric): Secondary | ICD-10-CM | POA: Diagnosis not present

## 2020-10-07 DIAGNOSIS — A048 Other specified bacterial intestinal infections: Secondary | ICD-10-CM

## 2020-10-08 DIAGNOSIS — D49512 Neoplasm of unspecified behavior of left kidney: Secondary | ICD-10-CM | POA: Diagnosis not present

## 2020-10-08 DIAGNOSIS — K8050 Calculus of bile duct without cholangitis or cholecystitis without obstruction: Secondary | ICD-10-CM | POA: Diagnosis not present

## 2020-10-08 LAB — HELICOBACTER PYLORI  SPECIAL ANTIGEN
MICRO NUMBER:: 11131442
SPECIMEN QUALITY: ADEQUATE

## 2020-10-14 ENCOUNTER — Telehealth: Payer: Self-pay

## 2020-10-14 NOTE — Telephone Encounter (Signed)
Dr. Rush Landmark, just an Willis with patient's daughter in regards to patient's lab results and stool test results.   Daughter states that patient is scheduled to see the surgeon on 10/18/20, he is unsure which surgeon he will be seeing.   She states that he will be seeing the Dr. Abner Greenspan at [facility] on 11/12 to discuss imaging results.

## 2020-10-14 NOTE — Telephone Encounter (Signed)
-----   Message from Irving Copas., MD sent at 10/13/2020  5:15 PM EDT ----- [name],Please let the patient's daughter know that his stool test for H. pylori has returned negative.  This is good news.  Hopefully he was not taking PPI at the time, because if he was this could be a false negative.FYI Dr. Watt Climes.  GM

## 2020-10-15 NOTE — Telephone Encounter (Signed)
Thank you for the update. Look forward to hearing back from the surgeons as to their next steps in evaluation GM

## 2020-10-18 DIAGNOSIS — D132 Benign neoplasm of duodenum: Secondary | ICD-10-CM | POA: Diagnosis not present

## 2020-10-21 ENCOUNTER — Telehealth: Payer: Self-pay | Admitting: Cardiology

## 2020-10-21 DIAGNOSIS — Z6841 Body Mass Index (BMI) 40.0 and over, adult: Secondary | ICD-10-CM

## 2020-10-21 DIAGNOSIS — Z01810 Encounter for preprocedural cardiovascular examination: Secondary | ICD-10-CM

## 2020-10-21 DIAGNOSIS — I2510 Atherosclerotic heart disease of native coronary artery without angina pectoris: Secondary | ICD-10-CM

## 2020-10-21 DIAGNOSIS — R0600 Dyspnea, unspecified: Secondary | ICD-10-CM

## 2020-10-21 DIAGNOSIS — R0609 Other forms of dyspnea: Secondary | ICD-10-CM

## 2020-10-21 NOTE — Telephone Encounter (Signed)
Received a preoperative cardiac stratification request letter from Dr. 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 Abner Greenspan.  In view of this, urgency, and prior stress test which is intermediate risk, and ongoing dyspnea and morbid obesity and CAD risk, I will repeat echocardiogram and also perform a Lexiscan nuclear stress test and set up a earlier office visit with me within the next week or 10 days.    ICD-10-CM   1. Coronary artery disease involving native coronary artery of native heart without angina pectoris  I25.10 PCV MYOCARDIAL PERFUSION WITH LEXISCAN    PCV ECHOCARDIOGRAM COMPLETE  2. DOE (dyspnea on exertion)  R06.00 PCV MYOCARDIAL PERFUSION WITH LEXISCAN    PCV ECHOCARDIOGRAM COMPLETE  3. Preoperative cardiovascular examination  Z01.810 PCV MYOCARDIAL PERFUSION WITH LEXISCAN    PCV ECHOCARDIOGRAM COMPLETE  4. Class 3 severe obesity due to excess calories with serious comorbidity and body mass index (BMI) of 50.0 to 59.9 in adult [facility])  E66.01    Z68.43       Justin Prows, MD, [facility] 10/21/2020, 10:34 PM Office: [phone] Pager: [phone]   CC: Michaelle Birks, MD

## 2020-10-22 DIAGNOSIS — D49512 Neoplasm of unspecified behavior of left kidney: Secondary | ICD-10-CM | POA: Diagnosis not present

## 2020-10-22 DIAGNOSIS — R350 Frequency of micturition: Secondary | ICD-10-CM | POA: Diagnosis not present

## 2020-10-22 NOTE — Telephone Encounter (Signed)
I have called his wife and advised to keep him NPO after MN Sunday for a stress test on Monday. Please call and see when he can come. Also needs echo at the earliest and can be done Tuesday or another day and OV with me or Celeste later

## 2020-10-25 ENCOUNTER — Other Ambulatory Visit: Payer: Self-pay

## 2020-10-25 ENCOUNTER — Ambulatory Visit: Payer: Medicare Other

## 2020-10-25 DIAGNOSIS — I2510 Atherosclerotic heart disease of native coronary artery without angina pectoris: Secondary | ICD-10-CM | POA: Diagnosis not present

## 2020-10-25 DIAGNOSIS — R0600 Dyspnea, unspecified: Secondary | ICD-10-CM

## 2020-10-25 DIAGNOSIS — Z01810 Encounter for preprocedural cardiovascular examination: Secondary | ICD-10-CM

## 2020-10-25 DIAGNOSIS — R0609 Other forms of dyspnea: Secondary | ICD-10-CM

## 2020-10-29 ENCOUNTER — Ambulatory Visit: Payer: Medicare Other | Admitting: Cardiology

## 2020-10-29 ENCOUNTER — Other Ambulatory Visit: Payer: Self-pay | Admitting: Urology

## 2020-11-10 ENCOUNTER — Ambulatory Visit: Payer: Medicare Other | Admitting: Cardiology

## 2020-11-10 ENCOUNTER — Encounter: Payer: Self-pay | Admitting: Cardiology

## 2020-11-10 ENCOUNTER — Other Ambulatory Visit: Payer: Self-pay

## 2020-11-10 VITALS — BP 143/82 | HR 63 | Resp 16 | Ht 70.0 in | Wt 306.8 lb

## 2020-11-10 DIAGNOSIS — E7800 Pure hypercholesterolemia, unspecified: Secondary | ICD-10-CM

## 2020-11-10 DIAGNOSIS — Z01810 Encounter for preprocedural cardiovascular examination: Secondary | ICD-10-CM | POA: Diagnosis not present

## 2020-11-10 DIAGNOSIS — I10 Essential (primary) hypertension: Secondary | ICD-10-CM

## 2020-11-10 DIAGNOSIS — I2510 Atherosclerotic heart disease of native coronary artery without angina pectoris: Secondary | ICD-10-CM | POA: Diagnosis not present

## 2020-11-10 MED ORDER — AMLODIPINE BESYLATE 10 MG PO TABS: 10.0000 mg | ORAL_TABLET | Freq: Every day | ORAL | 3 refills | Status: AC

## 2020-11-10 NOTE — Progress Notes (Signed)
Primary Physician/Referring:  Merrilee Seashore, MD  Patient ID: Justin Davidson, male    DOB: [birth year], 74 y.o.   MRN: [id number]  Chief Complaint  Patient presents with  . Follow-up    1 year  . Pre-op Clearance   HPI:    Justin Davidson  is a 74 y.o. AAM with coronary artery disease status post left circumflex and ramus intermedius PCI in 2011, residual moderate disease in mid LAD, remote history of colon cancer status post colon resection and chemotherapy, hypertension, hyperlipidemia, objective sleep apnea on CPAP.  He is scheduled to undergo surgery Gen Surgery Dr. Michaelle Birks  and urology this month (for a duodenal adenoma and left renal cancer.  He is here for preoperative cardiac work-up and also annual visit.  Fortunately except for chronic dyspnea he has not had any chest pain and he has not used any sublingual nitroglycerin.  Denies leg edema, PND or orthopnea.  Past Medical History:  Diagnosis Date  . Anxiety   . Colon cancer ([place])   . Colon polyps   . Coronary artery disease   . Dementia ([place])   . Dizziness and giddiness   . GERD (gastroesophageal reflux disease)   . Hypercholesteremia   . Hyperglycemia   . Hypertension   . OSA (obstructive sleep apnea)    CPAP   . Prediabetes    diet controlled   Past Surgical History:  Procedure Laterality Date  . BIOPSY  09/13/2020   Procedure: BIOPSY;  Surgeon: Rush Landmark Telford Nab., MD;  Location: [place];  Service: Gastroenterology;;  . cardaic stents    . CARDIAC CATHETERIZATION  10/28/2010  . COLONOSCOPY    . ESOPHAGOGASTRODUODENOSCOPY (EGD) WITH PROPOFOL N/A 04/19/2020   Procedure: ESOPHAGOGASTRODUODENOSCOPY (EGD) WITH PROPOFOL;  Surgeon: Clarene Essex, MD;  [facility];  Service: Endoscopy;  Laterality: N/A;  . ESOPHAGOGASTRODUODENOSCOPY (EGD) WITH PROPOFOL N/A 09/13/2020   Procedure: ESOPHAGOGASTRODUODENOSCOPY (EGD) WITH PROPOFOL;  Surgeon: Rush Landmark Telford Nab., MD;  Location: [place];   Service: Gastroenterology;  Laterality: N/A;  . EUS  09/13/2020   Procedure: UPPER ENDOSCOPIC ULTRASOUND (EUS) RADIAL;  Surgeon: Irving Copas., MD;  Location: [place];  Service: Gastroenterology;;  . HOT HEMOSTASIS N/A 04/19/2020   Procedure: HOT HEMOSTASIS (ARGON PLASMA COAGULATION/BICAP);  Surgeon: Clarene Essex, MD;  [facility];  Service: Endoscopy;  Laterality: N/A;  . PARTIAL COLECTOMY  1996   colon cancer   Social History   Tobacco Use  . Smoking status: Never Smoker  . Smokeless tobacco: Never Used  Substance Use Topics  . Alcohol use: Yes    Comment: occasional  Marital Status: Married    ROS  Review of Systems  Cardiovascular: Positive for dyspnea on exertion (stable). Negative for leg swelling and syncope.  Respiratory: Positive for sleep disturbances due to breathing and snoring (severe sleep apnea and uses CPAP regularly).   Musculoskeletal: Negative for joint swelling.  Gastrointestinal: Negative for anorexia, change in bowel habit, hematochezia and melena.  Genitourinary: Positive for decreased libido (ED).  All other systems reviewed and are negative.  Objective   Vitals with BMI 11/10/2020 09/13/2020 09/13/2020  Height 5\' 10"  - -  Weight 306 lbs 13 oz - -  BMI 48.18 - -  Systolic 563 149 702  Diastolic 82 37 32  Pulse 63 57 58      Physical Exam Constitutional:      General: He is not in acute distress.    Appearance: He is well-developed.     Comments:  Morbidly obese  HENT:     Head: Atraumatic.  Eyes:     Conjunctiva/sclera: Conjunctivae normal.  Neck:     Thyroid: No thyromegaly.     Comments: Short neck and difficult to evaluate JVP Cardiovascular:     Rate and Rhythm: Normal rate and regular rhythm.     Pulses:          Carotid pulses are 2+ on the right side and 2+ on the left side.      Dorsalis pedis pulses are 2+ on the right side and 2+ on the left side.       Posterior tibial pulses are 2+ on the right side and 2+  on the left side.     Heart sounds: Normal heart sounds. No murmur heard.  No gallop.      Comments: Femoral and popliteal pulse difficult to feel due to patient's body habitus.  Pulmonary:     Effort: Pulmonary effort is normal.     Breath sounds: Normal breath sounds.  Abdominal:     General: Bowel sounds are normal.     Palpations: Abdomen is soft.     Comments: Obese. Pannus present  Musculoskeletal:        General: Normal range of motion.     Cervical back: Neck supple.  Skin:    General: Skin is warm and dry.  Neurological:     Mental Status: He is alert.    Laboratory examination:   Recent Labs    04/19/20 1029 07/16/20 1509 09/13/20 1350  NA 142 138 138  K 3.5 3.5 3.5  CL  --  103 104  CO2  --  32 23  GLUCOSE  --  94 99  BUN  --  21 17  CREATININE  --  0.90 0.82  CALCIUM  --  9.1 8.6*  GFRNONAA  --   --  >60  GFRAA  --   --  >60   CrCl cannot be calculated (Patient's most recent lab result is older than the maximum 21 days allowed.).  CMP Latest Ref Rng & Units 10/06/2020 09/13/2020 07/16/2020  Glucose 70 - 99 mg/dL - 99 94  BUN 8 - 23 mg/dL - 17 21  Creatinine 0.61 - 1.24 mg/dL - 0.82 0.90  Sodium 135 - 145 mmol/L - 138 138  Potassium 3.5 - 5.1 mmol/L - 3.5 3.5  Chloride 98 - 111 mmol/L - 104 103  CO2 22 - 32 mmol/L - 23 32  Calcium 8.9 - 10.3 mg/dL - 8.6(L) 9.1  Total Protein 6.0 - 8.3 g/dL 6.7 6.1(L) -  Total Bilirubin 0.2 - 1.2 mg/dL 0.7 0.9 -  Alkaline Phos 39 - 117 U/L 98 80 -  AST 0 - 37 U/L 21 29 -  ALT 0 - 53 U/L 30 47(H) -   CBC Latest Ref Rng & Units 07/16/2020 04/19/2020 09/10/2018  WBC 4.0 - 10.5 K/uL 9.3 - 9.1  Hemoglobin 13.0 - 17.0 g/dL 14.1 14.6 14.3  Hematocrit 39 - 52 % 42.5 43.0 42.9  Platelets 150 - 400 K/uL 226.0 - 239.0    External labs:    Cholesterol, total 134.000 m 08/06/2020 HDL 38.000 mg 08/06/2020 LDL-C 75.000 mg 08/06/2020 Triglycerides 117.000 m 08/06/2020  A1C 5.900 % 08/06/2020 TSH 4.820 08/06/2020  Hemoglobin  14.100 g/d 07/16/2020 Platelets 226.000 K/ 07/16/2020   Creatinine, Serum 0.820 mg/ 09/13/2020 Potassium 3.500 mm 09/13/2020 Magnesium N/D ALT (SGPT) 30.000 U/L 10/06/2020   Medications and allergies   Allergies  Allergen Reactions  . Lisinopril Cough    Current Outpatient Medications on File Prior to Visit  Medication Sig Dispense Refill  . aspirin EC 81 MG tablet Take 81 mg by mouth daily.    Marland Kitchen atorvastatin (LIPITOR) 80 MG tablet TAKE 1 TABLET BY MOUTH DAILY (Patient taking differently: Take 80 mg by mouth daily. ) 90 tablet 3  . donepezil (ARICEPT) 23 MG TABS tablet Take 23 mg by mouth daily.     . metoprolol tartrate (LOPRESSOR) 50 MG tablet TAKE 1 TABLET BY MOUTH TWICE DAILY 180 tablet 3  . omeprazole (PRILOSEC) 40 MG capsule Take 1 capsule (40 mg total) by mouth in the morning and at bedtime. 60 capsule 11  . potassium chloride (MICRO-K) 10 MEQ CR capsule Take 10 mEq by mouth daily.   5  . Psyllium (METAMUCIL FIBER PO) Take 1 Scoop by mouth 2 (two) times daily.     . tamsulosin (FLOMAX) 0.4 MG CAPS capsule Take 0.4 mg by mouth daily after breakfast.      No current facility-administered medications on file prior to visit.     Radiology:  No results found. Cardiac Studies:   Coronary angiogram 10/28/10:  3.5x23, Promus mid CX and 2.5x23 Promus mid Ramus intermediate (DES). Residual 50% -60% Mid LAD.  Lexiscan (Walking with mod Bruce)Tetrofosmin Stress Test  10/25/2020: Nondiagnostic ECG stress. There is diaphragmatic attenuation noted in the inferior wall. There is a fixed defect in the inferior region from base to the apex without reversibility suggestive of a large scar.  No stress lung uptake. TID is normal. No stress lung uptake.  Overall LV systolic function is normal with regional wall motion abnormality with akinesis in the inferior wall. Stress LV EF: 53%.  No significant change from 04/12/2018. Intermediate risk study.   Echocardiogram  04/12/2018: Left ventricle  cavity is normal in size. Mild concentric hypertrophy of the left ventricle. Normal global wall motion. Visual EF is 60-65%. Doppler evidence of grade I (impaired) diastolic dysfunction. Calculated EF 69%. Left atrial cavity is mildly dilated. Increased LVOT velocity likely due to LVH and hyperdynamic LV. No significant valvular stenosis seen. Mild (Grade I) aortic regurgitation. Mild tricuspid regurgitation. Mild pulmonic regurgitation. Mild pulmonary hypertension. Estimated pulmonary artery systolic pressure 36 mmHg. Compared to previous study in 2012, pulmonary hypertension is new.   EKG:   EKG 11/10/2020: Sinus bradycardia at rate of 57 bpm, normal axis.  Incomplete right bundle branch block.  No evident ischemia, normal EKG. compared to 10/30/2019, nonspecific T abnormality not present.   Assessment     ICD-10-CM   1. Preoperative cardiovascular examination  Z01.810   2. Coronary artery disease involving native coronary artery of native heart without angina pectoris  I25.10   3. Essential hypertension  I10 EKG 12-Lead    amLODipine (NORVASC) 10 MG tablet  4. Morbid obesity due to excess calories (HCC)  E66.01   5. Hypercholesteremia  E78.00    Medications Discontinued During This Encounter  Medication Reason  . clopidogrel (PLAVIX) 75 MG tablet Completed Course  . clonazePAM (KLONOPIN) 0.5 MG tablet Error  . docusate sodium (COLACE) 100 MG capsule Patient Preference  . amLODipine (NORVASC) 5 MG tablet Reorder   Meds ordered this encounter  Medications  . amLODipine (NORVASC) 10 MG tablet    Sig: Take 1 tablet (10 mg total) by mouth daily.    Dispense:  90 tablet    Refill:  3     Recommendations:    Justin Davidson is  a 74 y.o. AAM with coronary artery disease status post left circumflex and ramus intermedius PCI in 2011, residual moderate disease in mid LAD, remote history of colon cancer status post colon resection and chemotherapy, hypertension, hyperlipidemia,  objective sleep apnea on CPAP.  He is scheduled to undergo surgery Gen Surgery Dr. Michaelle Birks  and urology this month (for a duodenal adenoma and left renal cancer.  He is here for preoperative cardiac work-up and also annual visit.  Except for obesity being his major issue, fortunately he has remained stable from cardiac standpoint without clinical evidence of heart failure, recurrence of angina pectoris.  I reviewed his recently performed stress test, in fact inferior ischemia has improved.  At this point I have recommended that he undergo surgery with low risk from cardiac standpoint.  I have also discontinue Plavix as he has completed the course.  He will continue with aspirin 81 mg daily perioperatively. Echo pending, do not anticipate significant valvular heart disease or reduced LVEF. Will try to get this done in the next couple days.   I reviewed his external labs, lipids are well controlled.  Renal function is normal.  His blood pressure was elevated today, will increase his amlodipine to 10 mg daily.  Otherwise stable from cardiac standpoint and will continue to watch his blood pressure closely and if it remains >130/80 mm Hg will inform us.  Office visit in 6 months or sooner if problems.    Justin Prows, MD, [facility] 11/10/2020, 1:07 PM Office: [phone] Pager: [phone]   CC: Michaelle Birks, MD; Merrilee Seashore, MD

## 2020-11-11 ENCOUNTER — Ambulatory Visit: Payer: Medicare Other

## 2020-11-11 DIAGNOSIS — R0609 Other forms of dyspnea: Secondary | ICD-10-CM | POA: Diagnosis not present

## 2020-11-11 DIAGNOSIS — R0600 Dyspnea, unspecified: Secondary | ICD-10-CM

## 2020-11-11 DIAGNOSIS — I2510 Atherosclerotic heart disease of native coronary artery without angina pectoris: Secondary | ICD-10-CM

## 2020-11-11 DIAGNOSIS — Z01810 Encounter for preprocedural cardiovascular examination: Secondary | ICD-10-CM | POA: Diagnosis not present

## 2020-11-14 ENCOUNTER — Encounter: Payer: Self-pay | Admitting: Cardiology

## 2020-11-15 ENCOUNTER — Ambulatory Visit: Payer: Medicare Other | Admitting: Cardiology

## 2020-11-15 NOTE — Progress Notes (Signed)
Called and spoke with patient regarding his echocardiogram.

## 2020-11-19 ENCOUNTER — Other Ambulatory Visit: Payer: Medicare Other

## 2020-12-01 ENCOUNTER — Telehealth: Payer: Self-pay

## 2020-12-01 ENCOUNTER — Other Ambulatory Visit: Payer: Self-pay

## 2020-12-01 ENCOUNTER — Encounter (HOSPITAL_COMMUNITY)
Admission: RE | Admit: 2020-12-01 | Discharge: 2020-12-01 | Disposition: A | Discharge status: Home / Self Care | Payer: Medicare Other | Source: Ambulatory Visit | Attending: Surgery | Admitting: Surgery

## 2020-12-01 DIAGNOSIS — F419 Anxiety disorder, unspecified: Secondary | ICD-10-CM | POA: Diagnosis not present

## 2020-12-01 DIAGNOSIS — C241 Malignant neoplasm of ampulla of Vater: Secondary | ICD-10-CM | POA: Diagnosis not present

## 2020-12-01 DIAGNOSIS — N2889 Other specified disorders of kidney and ureter: Secondary | ICD-10-CM | POA: Diagnosis not present

## 2020-12-01 DIAGNOSIS — Z01812 Encounter for preprocedural laboratory examination: Secondary | ICD-10-CM | POA: Diagnosis not present

## 2020-12-01 DIAGNOSIS — R7303 Prediabetes: Secondary | ICD-10-CM | POA: Insufficient documentation

## 2020-12-01 DIAGNOSIS — Z9989 Dependence on other enabling machines and devices: Secondary | ICD-10-CM | POA: Diagnosis not present

## 2020-12-01 DIAGNOSIS — E7800 Pure hypercholesterolemia, unspecified: Secondary | ICD-10-CM | POA: Diagnosis not present

## 2020-12-01 DIAGNOSIS — Z85038 Personal history of other malignant neoplasm of large intestine: Secondary | ICD-10-CM | POA: Insufficient documentation

## 2020-12-01 DIAGNOSIS — G4733 Obstructive sleep apnea (adult) (pediatric): Secondary | ICD-10-CM | POA: Insufficient documentation

## 2020-12-01 DIAGNOSIS — K219 Gastro-esophageal reflux disease without esophagitis: Secondary | ICD-10-CM | POA: Diagnosis not present

## 2020-12-01 DIAGNOSIS — F0390 Unspecified dementia without behavioral disturbance: Secondary | ICD-10-CM | POA: Insufficient documentation

## 2020-12-01 DIAGNOSIS — I2510 Atherosclerotic heart disease of native coronary artery without angina pectoris: Secondary | ICD-10-CM | POA: Insufficient documentation

## 2020-12-01 DIAGNOSIS — Z6841 Body Mass Index (BMI) 40.0 and over, adult: Secondary | ICD-10-CM | POA: Insufficient documentation

## 2020-12-01 DIAGNOSIS — I10 Essential (primary) hypertension: Secondary | ICD-10-CM | POA: Diagnosis not present

## 2020-12-01 DIAGNOSIS — Z9049 Acquired absence of other specified parts of digestive tract: Secondary | ICD-10-CM | POA: Diagnosis not present

## 2020-12-01 DIAGNOSIS — Z7902 Long term (current) use of antithrombotics/antiplatelets: Secondary | ICD-10-CM | POA: Diagnosis not present

## 2020-12-01 DIAGNOSIS — Z7982 Long term (current) use of aspirin: Secondary | ICD-10-CM | POA: Insufficient documentation

## 2020-12-01 LAB — CBC
HCT: 42.5 % (ref 39.0–52.0)
Hemoglobin: 13.5 g/dL (ref 13.0–17.0)
MCH: 28.3 pg (ref 26.0–34.0)
MCHC: 31.8 g/dL (ref 30.0–36.0)
MCV: 89.1 fL (ref 80.0–100.0)
Platelets: 237 10*3/uL (ref 150–400)
RBC: 4.77 MIL/uL (ref 4.22–5.81)
RDW: 14.9 % (ref 11.5–15.5)
WBC: 7.8 10*3/uL (ref 4.0–10.5)
nRBC: 0 % (ref 0.0–0.2)

## 2020-12-01 LAB — BASIC METABOLIC PANEL
Anion gap: 8 (ref 5–15)
BUN: 19 mg/dL (ref 8–23)
CO2: 24 mmol/L (ref 22–32)
Calcium: 8.6 mg/dL — ABNORMAL LOW (ref 8.9–10.3)
Chloride: 106 mmol/L (ref 98–111)
Creatinine, Ser: 0.83 mg/dL (ref 0.61–1.24)
GFR, Estimated: >60 mL/min (ref >60)
Glucose, Bld: 122 mg/dL — ABNORMAL HIGH (ref 70–99)
Potassium: 3.1 mmol/L — ABNORMAL LOW (ref 3.5–5.1)
Sodium: 138 mmol/L (ref 135–145)

## 2020-12-01 LAB — PROTIME-INR
INR: 1.1 (ref 0.8–1.2)
Prothrombin Time: 13.5 seconds (ref 11.4–15.2)

## 2020-12-01 LAB — GLUCOSE, CAPILLARY: Glucose-Capillary: 116 mg/dL — ABNORMAL HIGH (ref 70–99)

## 2020-12-01 NOTE — Pre-Procedure Instructions (Signed)
Justin Davidson  12/01/2020      [id number] - [address], [address] [place] [address] Phone: [phone] Fax: [phone]    Your procedure is scheduled on 12/07/20.  Report to [facility] Admitting at 530 A.M.  Call this number if you have problems the morning of surgery:  [phone]   Remember:  Do not eat or drink after midnight.   Take these medicines the morning of surgery with A SIP OF WATER -----NORVASC,KLONOPIN,ARICEPT,METOPROLOL,PRILOSEC,FLOMAX,COLACE    Do not wear jewelry, make-up or nail polish.  Do not wear lotions, powders, or perfumes, or deodorant.  Do not shave 48 hours prior to surgery.  Men may shave face and neck.  Do not bring valuables to the hospital.  [facility] is not responsible for any belongings or valuables.  Contacts, dentures or bridgework may not be worn into surgery.  Leave your suitcase in the car.  After surgery it may be brought to your room.  For patients admitted to the hospital, discharge time will be determined by your treatment team.  Patients discharged the day of surgery will not be allowed to drive home.   Name and phone number of your driver:   Special instructions:  Do not take any aspirin,anti-inflammatories,vitamins,or herbal supplements 5-7 days prior to surgery.  Please read over the following fact sheets that you were given. Coughing and Deep Breathing   [place] - Preparing for Surgery  Before surgery, you can play an important role.  Because skin is not sterile, your skin needs to be as free of germs as possible.  You can reduce the number of germs on you skin by washing with CHG (chlorahexidine gluconate) soap before surgery.  CHG is an antiseptic cleaner which kills germs and bonds with the skin to continue killing germs even after washing.  Oral Hygiene is also important in reducing the risk of infection.   Remember to brush your teeth with your regular toothpaste the morning of surgery.  Please DO NOT use if you have an allergy to CHG or antibacterial soaps.  If your skin becomes reddened/irritated stop using the CHG and inform your nurse when you arrive at Short Stay.  Do not shave (including legs and underarms) for at least 48 hours prior to the first CHG shower.  You may shave your face.  Please follow these instructions carefully:   1.  Shower with CHG Soap the night before surgery and the morning of Surgery.  2.  If you choose to wash your hair, wash your hair first as usual with your normal shampoo.  3.  After you shampoo, rinse your hair and body thoroughly to remove the shampoo. 4.  Use CHG as you would any other liquid soap.  You can apply chg directly to the skin and wash gently with a      scrungie or washcloth.           5.  Apply the CHG Soap to your body ONLY FROM THE NECK DOWN.   Do not use on open wounds or open sores. Avoid contact with your eyes, ears, mouth and genitals (private parts).  Wash genitals (private parts) with your normal soap.  6.  Wash thoroughly, paying special attention to the area where your surgery will be performed.  7.  Thoroughly rinse your body with warm water from the neck down.  8.  DO NOT  shower/wash with your normal soap after using and rinsing off the CHG Soap.  9.  Pat yourself dry with a clean towel.            10.  Wear clean pajamas.            11.  Place clean sheets on your bed the night of your first shower and do not sleep with pets.  Day of Surgery  Do not apply any lotions/deoderants the morning of surgery.   Please wear clean clothes to the hospital/surgery center. Remember to brush your teeth with toothpaste.

## 2020-12-01 NOTE — Progress Notes (Incomplete)
Anesthesia Chart Review:  Case: [id number] Date/Time: 12/07/20 0715   Procedures:      POSSIBLE WHIPPLE PROCEDURE, OPEN AMPULLECTOMY (N/A )     LEFT OPEN NEPHRECTOMY PARTIAL (Left )     POSSIBLE LEFT RADICAL NEPHRECTOMY (Left )   Anesthesia type: General   Pre-op diagnosis: ampullary mass   Location: [facility] OR ROOM 02 / [place]   Surgeons: Dwan Bolt, MD; Janith Lima, MD      DISCUSSION:  VS: BP (!) 133/44   Pulse 100   Temp (P) 36.8 C (Oral)   Resp 19   Ht 5\' 10"  (1.778 m)   Wt (!) 139 kg   SpO2 98%   BMI 43.96 kg/m   PROVIDERS: Merrilee Seashore, MD   LABS: {CHL AN LABS REVIEWED:112001::"Labs reviewed: Acceptable for surgery."} (all labs ordered are listed, but only abnormal results are displayed)  Labs Reviewed  GLUCOSE, CAPILLARY - Abnormal; Notable for the following components:      Result Value   Glucose-Capillary 116 (*)    All other components within normal limits  BASIC METABOLIC PANEL - Abnormal; Notable for the following components:   Potassium 3.1 (*)    Glucose, Bld 122 (*)    Calcium 8.6 (*)    All other components within normal limits  CBC  PROTIME-INR     IMAGES:   EKG:   CV:  Past Medical History:  Diagnosis Date  . Anxiety   . Colon cancer ([place])   . Colon polyps   . Coronary artery disease   . Dementia ([place])   . Dizziness and giddiness   . GERD (gastroesophageal reflux disease)   . Hypercholesteremia   . Hyperglycemia   . Hypertension   . OSA (obstructive sleep apnea)    CPAP   . Prediabetes    diet controlled    Past Surgical History:  Procedure Laterality Date  . BIOPSY  09/13/2020   Procedure: BIOPSY;  Surgeon: Rush Landmark Telford Nab., MD;  Location: [place];  Service: Gastroenterology;;  . cardaic stents    . CARDIAC CATHETERIZATION  10/28/2010  . COLONOSCOPY    . ESOPHAGOGASTRODUODENOSCOPY (EGD) WITH PROPOFOL N/A 04/19/2020   Procedure: ESOPHAGOGASTRODUODENOSCOPY (EGD) WITH PROPOFOL;  Surgeon: Clarene Essex, MD;   [facility];  Service: Endoscopy;  Laterality: N/A;  . ESOPHAGOGASTRODUODENOSCOPY (EGD) WITH PROPOFOL N/A 09/13/2020   Procedure: ESOPHAGOGASTRODUODENOSCOPY (EGD) WITH PROPOFOL;  Surgeon: Rush Landmark Telford Nab., MD;  Location: [place];  Service: Gastroenterology;  Laterality: N/A;  . EUS  09/13/2020   Procedure: UPPER ENDOSCOPIC ULTRASOUND (EUS) RADIAL;  Surgeon: Irving Copas., MD;  Location: [place];  Service: Gastroenterology;;  . HOT HEMOSTASIS N/A 04/19/2020   Procedure: HOT HEMOSTASIS (ARGON PLASMA COAGULATION/BICAP);  Surgeon: Clarene Essex, MD;  Location[facility];  Service: Endoscopy;  Laterality: N/A;  . PARTIAL COLECTOMY  1996   colon cancer    MEDICATIONS: . amLODipine (NORVASC) 10 MG tablet  . amLODipine (NORVASC) 5 MG tablet  . aspirin EC 81 MG tablet  . atorvastatin (LIPITOR) 80 MG tablet  . clonazePAM (KLONOPIN) 0.5 MG tablet  . clopidogrel (PLAVIX) 75 MG tablet  . docusate sodium (COLACE) 100 MG capsule  . donepezil (ARICEPT) 23 MG TABS tablet  . metoprolol tartrate (LOPRESSOR) 50 MG tablet  . omeprazole (PRILOSEC) 40 MG capsule  . potassium chloride (MICRO-K) 10 MEQ CR capsule  . Psyllium (METAMUCIL FIBER PO)  . tamsulosin (FLOMAX) 0.4 MG CAPS capsule   No current facility-administered medications for this encounter.

## 2020-12-01 NOTE — Pre-Procedure Instructions (Incomplete)
    Justin Davidson  12/01/2020      [id number] - [address], [address] AT [address] [place] [address] Phone: [phone] Fax: [phone]    Your procedure is scheduled on ***.  Report to {OR ARRIVAL LOCATIONS:20459} at *** A.M.  Call this number if you have problems the morning of surgery:  {OR PAT NUMBER TO MMHW:80881}   Remember:  Do not eat or drink after midnight.  You may drink clear liquids until *** .  Clear liquids allowed are:                    {Allowable Clear Liquids:21296}    Take these medicines the morning of surgery with A SIP OF WATER ***    Do not wear jewelry, make-up or nail polish.  Do not wear lotions, powders, or perfumes, or deodorant.  Do not shave 48 hours prior to surgery.  Men may shave face and neck.  Do not bring valuables to the hospital.  [facility] is not responsible for any belongings or valuables.  Contacts, dentures or bridgework may not be worn into surgery.  Leave your suitcase in the car.  After surgery it may be brought to your room.  For patients admitted to the hospital, discharge time will be determined by your treatment team.  Patients discharged the day of surgery will not be allowed to drive home.   Name and phone number of your driver:   *** Special instructions:  ***  Please read over the following fact sheets that you were given. {OR PAT FACT SHEETS JSRPR:94585}

## 2020-12-01 NOTE — Telephone Encounter (Signed)
Daughter called stating that pt has been taking plavix since ov on 11/10/20. He was not aware he was supposed to stop it. She wants to make sure that it will not affect the upcoming surgery or anything else.

## 2020-12-01 NOTE — Progress Notes (Signed)
PCP - DR Ashby Dawes Cardiologist - DR Einar Gip     CLEARANCE IN EPIC    -  EKG - 12/21 Stress Test - NA ECHO - 12/21 Cardiac Cath - NA      Sleep Study - YES CPAP - YES   Blood Thinner Instructions:PT WAS TO STOP PLAVIX  WITH LAST OV,BUT DIDN'T.OFFICE NOTIFIED  AND PT TO STOP TODAY Aspirin Instructions:CONTINUE  COVID TEST- FOR 12/27 /21   Anesthesia review: HEART HX        [place] AWARE  Patient denies shortness of breath, fever, cough and chest pain at PAT appointment   All instructions explained to the patient, with a verbal understanding of the material. Patient agrees to go over the instructions while at home for a better understanding. Patient also instructed to self quarantine after being tested for COVID-19. The opportunity to ask questions was provided.

## 2020-12-02 NOTE — Progress Notes (Addendum)
Anesthesia Chart Review:  Case: [id number] Date/Time: 12/07/20 0715   Procedures:      POSSIBLE WHIPPLE PROCEDURE, OPEN AMPULLECTOMY (N/A )     LEFT OPEN NEPHRECTOMY PARTIAL (Left )     POSSIBLE LEFT RADICAL NEPHRECTOMY (Left )   Anesthesia type: General   Pre-op diagnosis: ampullary mass   Location: [facility] OR ROOM 02 / [place]   Surgeons: Dwan Bolt, MD; Janith Lima, MD      DISCUSSION: Patient is a 74 year old male scheduled for the above procedure. On 09/13/20 he underwent EGD/EUS for incomplete resection of duodenal adenoma from 04/19/20 EGD. Findings showed a ampulla lesion, dilated CBD and common hepatic duct and findings suggestive of chronic pancreatitis. This led to MRI/MRCP that was limited by motion artifact but may be an intraluminal mass of the duodenum near the ampulla and also a left renal mass. He is now scheduled for the above procedures.  Other history includes never smoker, CAD (s/p DES CX & DES Ramus INT 10/28/10), HTN, pre-diabetes, hypercholesterolemia, dementia, GERD, colon cancer (s/p partial colectomy 1996, chemotherapy), OSA (CPAP), anxiety. BMI is consistent with morbid obesity.   He was evaluated by cardiologist Dr. Einar Gip on 11/10/20 for routine follow-up and preoperative evaluation. He wrote, "Except for obesity being his major issue, fortunately he has remained stable from cardiac standpoint without clinical evidence of heart failure, recurrence of angina pectoris.  I reviewed his recently performed stress test, in fact inferior ischemia has improved.  At this point I have recommended that he undergo surgery with low risk from cardiac standpoint.  I have also discontinue Plavix as he has completed the course.  He will continue with aspirin 81 mg daily perioperatively. Echo pending, do not anticipate significant valvular heart disease or reduced LVEF. Will try to get this done in the next couple days. (Echo done 11/11/20 and showed normal LVEF, mild AR.). Patient's last Plavix  was on 12/01/20, as he had misunderstood that he could discontinue Plavix.   Preoperative COVID-19 test is scheduled for 12/06/2020.  His CBC is within normal limits, but I do not see that a T&S has been done so this will need to be done on the day of surgery. Anesthesia team to evaluate on the day of surgery.    VS: BP (!) 133/44   Pulse 100   Temp (P) 36.8 C (Oral)   Resp 19   Ht 5\' 10"  (1.778 m)   Wt (!) 139 kg   SpO2 98%   BMI 43.96 kg/m    PROVIDERS: Merrilee Seashore, MD is PCP  Adrian Prows, MD is cardiologist Clarene Essex, MD and Justice Britain, MD are GI Rexene Alberts, MD is urologist Michaelle Birks, MD is general surgeon   LABS: Labs reviewed: Acceptable for surgery. LFTs WNL 10/06/20. For T&S on the day of surgery, as no result from PAT visit.   (all labs ordered are listed, but only abnormal results are displayed)  Labs Reviewed  GLUCOSE, CAPILLARY - Abnormal; Notable for the following components:      Result Value   Glucose-Capillary 116 (*)    All other components within normal limits  BASIC METABOLIC PANEL - Abnormal; Notable for the following components:   Potassium 3.1 (*)    Glucose, Bld 122 (*)    Calcium 8.6 (*)    All other components within normal limits  CBC  PROTIME-INR    OTHER: EGD 09/13/20: Impression: - Zenker's diverticulum. - No gross lesions in esophagus proximally. -  LA Grade B esophagitis with no bleeding distally. - 3 cm hiatal hernia. - Gastritis. No other gross lesions in the stomach. Biopsied. - No gross lesions in the duodenal bulb. 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 Biopsied.  EUS 09/13/20: Impression: - A lesion was found in the ampulla. A tissue diagnosis was obtained prior to this exam.This is consistent with an adenoma based on prior biopsies. Did not see overt invasion into the biliary duct or pancreatic duct, but dilitation was noted endoscopically as noted  below.. - There was dilation in the common bile duct and in the common hepatic duct. - Hyperechoic material consistent with sludge and small stones was visualized endosonographically in the gallbladder. - The pancreatic duct had a dilated endosonographic appearance in the pancreatic head but otherwise was normal in size. - Pancreatic parenchymal abnormalities consisting of lobularity with honeycombing and hyperechoic strands were noted in the entire pancreas. Suggestive of chronic pancreatitis but not-diagnostic. - No malignant-appearing lymph nodes were visualized in the celiac region (level 20), perigastric region and peripancreatic region.  Spirometry 09/10/18: FVC 2.7 (68%), FEV1 2.2 (75%). FEV1/FVC 83% (110%), FEF25-75% 2.7 (112%). Interpretation: Normal spirometry.  POLYSOMNOGRAPHY 06/16/18: IMPRESSION :  1. Severe Obstructive Sleep Apnea (OSA) at AHI 62.9 and  accentuated by REM sleep (AHI was 82.8/h) and supine sleep to AHI  80.3 /h.  2. Prolonged total desaturation time of 144 minutes with SpO2  nadir of 70%.  3. Snoring  4. OSA responded well to CPAP pressures above 10 cm water, but  did not correct hypoxemia as well.    IMAGES: MRI Abd/MRCP 09/20/20: IMPRESSION: 1. Examination is generally limited by motion artifact throughout. 2. Distended gallbladder containing multiple small gallstones near the gallbladder neck and within the proximal cystic duct. There is intra and extrahepatic biliary ductal dilatation, the common bile duct measuring up to 1.3 cm. Suspect a small calculus near the ampulla measuring 5 mm. 3. There may be an intraluminal mass of the duodenum near the ampulla, poorly assessed but measuring approximately 4.3 cm and of uncertain significance, new compared to remote exam remote prior examination dated 2007. Consider direct endoscopic visualization to further evaluate. 4. There is a heterogeneous, mixed solid and cystic mass of the posterior midportion of  the left kidney measuring approximately 3.8 cm. Although assessment for contrast enhancement is significantly limited by motion artifact, this appears to demonstrate at least some heterogeneous enhancement. Findings are highly suspicious for renal cell carcinoma and this lesion is new compared to remote prior CT dated 2007.   EKG: EKG 11/10/2020: Sinus bradycardia at rate of 57 bpm, normal axis.  Incomplete right bundle branch block.  No evident ischemia, normal EKG.     CV: Echocardiogram 11/11/2020:  Left ventricle cavity is normal in size and wall thickness. Normal global  wall motion. Normal LV systolic function with EF 55%. Doppler evidence of  grade I (impaired) diastolic dysfunction, normal LAP.  Structurally normal trileaflet aortic valve. Mild (Grade I) aortic  regurgitation.  No evidence of pulmonary hypertension.  Unlike previous study in 2019, pulmonary hypertension not seen on this  study.   Lexiscan (Walking with mod Bruce)Tetrofosmin Stress Test  10/25/2020: Nondiagnostic ECG stress. There is diaphragmatic attenuation noted in the inferior wall. There is a fixed defect in the inferior region from base to the apex without reversibility suggestive of a large scar.  No stress lung uptake. TID is normal. No stress lung uptake.  Overall LV systolic function  is normal with regional wall motion abnormality with akinesis in the inferior wall. Stress LV EF: 53%.  No significant change from 04/12/2018. Intermediate risk study.   Cardiac cath 10/28/2020: 1.  LV: Left ventricular systolic function was normal with an ejection fraction of 60%. 2.  RCA: Large caliber vessel and a dominant vessel that has mild luminal irregularity in the proximal segment consisting of 20 to 30% stenosis. 3.  LM: Large caliber vessel, smooth and normal. 4.  CX: Large caliber vessel.  Mid segment hazy 80% stenosis. 5.  Ramus intermediate: Moderate vessel.  Initially it appeared to be small.  High-grade  90% stenosis in the midsegment. 6.  LAD: Large caliber vessel.  It has acute tortuosity in the mid segment.  Mild to moderate diffuse luminal irregularity was noted in the LAD along with the diagonal 2, which has ostial 50% stenosis followed by the mid LAD at the site of diagonal 2 where there was acute angulation with 50% stenosis. PCI: Successful PTCA and DES of mid circumflex.  Successful PTCA and DES ramus intermediate branch.   Past Medical History:  Diagnosis Date  . Anxiety   . Colon cancer ([place])   . Colon polyps   . Coronary artery disease   . Dementia ([place])   . Dizziness and giddiness   . GERD (gastroesophageal reflux disease)   . Hypercholesteremia   . Hyperglycemia   . Hypertension   . OSA (obstructive sleep apnea)    CPAP   . Prediabetes    diet controlled    Past Surgical History:  Procedure Laterality Date  . BIOPSY  09/13/2020   Procedure: BIOPSY;  Surgeon: Rush Landmark Telford Nab., MD;  Location: [facility];  Service: Gastroenterology;;  . cardaic stents    . CARDIAC CATHETERIZATION  10/28/2010  . COLONOSCOPY    . ESOPHAGOGASTRODUODENOSCOPY (EGD) WITH PROPOFOL N/A 04/19/2020   Procedure: ESOPHAGOGASTRODUODENOSCOPY (EGD) WITH PROPOFOL;  Surgeon: Clarene Essex, MD;  Location: [facility];  Service: Endoscopy;  Laterality: N/A;  . ESOPHAGOGASTRODUODENOSCOPY (EGD) WITH PROPOFOL N/A 09/13/2020   Procedure: ESOPHAGOGASTRODUODENOSCOPY (EGD) WITH PROPOFOL;  Surgeon: Rush Landmark Telford Nab., MD;  Location: [place];  Service: Gastroenterology;  Laterality: N/A;  . EUS  09/13/2020   Procedure: UPPER ENDOSCOPIC ULTRASOUND (EUS) RADIAL;  Surgeon: Irving Copas., MD;  Location: [place];  Service: Gastroenterology;;  . HOT HEMOSTASIS N/A 04/19/2020   Procedure: HOT HEMOSTASIS (ARGON PLASMA COAGULATION/BICAP);  Surgeon: Clarene Essex, MD;  Location: [facility];  Service: Endoscopy;  Laterality: N/A;  . PARTIAL COLECTOMY  1996   colon cancer    MEDICATIONS: .  amLODipine (NORVASC) 10 MG tablet  . amLODipine (NORVASC) 5 MG tablet  . aspirin EC 81 MG tablet  . atorvastatin (LIPITOR) 80 MG tablet  . clonazePAM (KLONOPIN) 0.5 MG tablet  . clopidogrel (PLAVIX) 75 MG tablet  . docusate sodium (COLACE) 100 MG capsule  . donepezil (ARICEPT) 23 MG TABS tablet  . metoprolol tartrate (LOPRESSOR) 50 MG tablet  . omeprazole (PRILOSEC) 40 MG capsule  . potassium chloride (MICRO-K) 10 MEQ CR capsule  . Psyllium (METAMUCIL FIBER PO)  . tamsulosin (FLOMAX) 0.4 MG CAPS capsule   No current facility-administered medications for this encounter.    Myra Gianotti, PA-C Surgical Short Stay/Anesthesiology [facility] Phone [phone] [facility] Phone [phone] 12/02/2020 11:52 AM

## 2020-12-02 NOTE — Anesthesia Preprocedure Evaluation (Addendum)
Anesthesia Evaluation  Patient identified by MRN, date of birth, ID band Patient awake    Reviewed: Allergy & Precautions, NPO status , Patient's Chart, lab work & pertinent test results  Airway Mallampati: II  TM Distance: >3 FB Neck ROM: Full    Dental  (+) Dental Advisory Given, Chipped   Pulmonary sleep apnea ,    Pulmonary exam normal breath sounds clear to auscultation       Cardiovascular hypertension, Pt. on medications and Pt. on home beta blockers + CAD and + DOE  Normal cardiovascular exam Rhythm:Regular Rate:Normal   Echocardiogram 11/11/2020:  Left ventricle cavity is normal in size and wall thickness. Normal global wall motion. Normal LV systolic function with EF 55%. Doppler evidence of grade I (impaired) diastolic dysfunction, normal LAP. Structurally normal trileaflet aortic valve. Mild (Grade I) aortic regurgitation.  No evidence of pulmonary hypertension. Unlike previous study in 2019, pulmonary hypertension not seen on this study.      Neuro/Psych PSYCHIATRIC DISORDERS Anxiety Dementia negative neurological ROS     GI/Hepatic Neg liver ROS, GERD  ,  Endo/Other  Morbid obesity  Renal/GU negative Renal ROS     Musculoskeletal negative musculoskeletal ROS (+)   Abdominal (+) + obese,   Peds  Hematology negative hematology ROS (+)   Anesthesia Other Findings   Reproductive/Obstetrics                          Anesthesia Physical Anesthesia Plan  ASA: III  Anesthesia Plan: General   Post-op Pain Management:    Induction: Intravenous  PONV Risk Score and Plan: 4 or greater and Ondansetron and Dexamethasone  Airway Management Planned: Oral ETT  Additional Equipment: Arterial line  Intra-op Plan:   Post-operative Plan: Extubation in OR  Informed Consent: I have reviewed the patients History and Physical, chart, labs and discussed the procedure including the risks,  benefits and alternatives for the proposed anesthesia with the patient or authorized representative who has indicated his/her understanding and acceptance.     Dental advisory given  Plan Discussed with: CRNA  Anesthesia Plan Comments: (2 x PIV  A-line   PAT note written 12/02/2020 by Myra Gianotti, PA-C. For T&S on the day of surgery.  )     Anesthesia Quick Evaluation

## 2020-12-06 ENCOUNTER — Ambulatory Visit: Payer: Self-pay | Admitting: Surgery

## 2020-12-06 ENCOUNTER — Other Ambulatory Visit (HOSPITAL_COMMUNITY)
Admission: RE | Admit: 2020-12-06 | Discharge: 2020-12-06 | Disposition: A | Discharge status: Home / Self Care | Payer: Medicare Other | Source: Ambulatory Visit | Attending: Surgery | Admitting: Surgery

## 2020-12-06 DIAGNOSIS — Z83438 Family history of other disorder of lipoprotein metabolism and other lipidemia: Secondary | ICD-10-CM | POA: Diagnosis not present

## 2020-12-06 DIAGNOSIS — F419 Anxiety disorder, unspecified: Secondary | ICD-10-CM | POA: Diagnosis present

## 2020-12-06 DIAGNOSIS — C642 Malignant neoplasm of left kidney, except renal pelvis: Secondary | ICD-10-CM | POA: Diagnosis not present

## 2020-12-06 DIAGNOSIS — D135 Benign neoplasm of extrahepatic bile ducts: Secondary | ICD-10-CM | POA: Diagnosis not present

## 2020-12-06 DIAGNOSIS — Z20822 Contact with and (suspected) exposure to covid-19: Secondary | ICD-10-CM | POA: Insufficient documentation

## 2020-12-06 DIAGNOSIS — E669 Obesity, unspecified: Secondary | ICD-10-CM | POA: Diagnosis present

## 2020-12-06 DIAGNOSIS — K567 Ileus, unspecified: Secondary | ICD-10-CM | POA: Diagnosis not present

## 2020-12-06 DIAGNOSIS — I2510 Atherosclerotic heart disease of native coronary artery without angina pectoris: Secondary | ICD-10-CM | POA: Diagnosis not present

## 2020-12-06 DIAGNOSIS — Z01812 Encounter for preprocedural laboratory examination: Secondary | ICD-10-CM | POA: Insufficient documentation

## 2020-12-06 DIAGNOSIS — K56609 Unspecified intestinal obstruction, unspecified as to partial versus complete obstruction: Secondary | ICD-10-CM | POA: Diagnosis not present

## 2020-12-06 DIAGNOSIS — Z4682 Encounter for fitting and adjustment of non-vascular catheter: Secondary | ICD-10-CM | POA: Diagnosis not present

## 2020-12-06 DIAGNOSIS — Z6841 Body Mass Index (BMI) 40.0 and over, adult: Secondary | ICD-10-CM | POA: Diagnosis not present

## 2020-12-06 DIAGNOSIS — I10 Essential (primary) hypertension: Secondary | ICD-10-CM | POA: Diagnosis not present

## 2020-12-06 DIAGNOSIS — Z85038 Personal history of other malignant neoplasm of large intestine: Secondary | ICD-10-CM | POA: Diagnosis not present

## 2020-12-06 DIAGNOSIS — F0390 Unspecified dementia without behavioral disturbance: Secondary | ICD-10-CM | POA: Diagnosis present

## 2020-12-06 DIAGNOSIS — K8010 Calculus of gallbladder with chronic cholecystitis without obstruction: Secondary | ICD-10-CM | POA: Diagnosis not present

## 2020-12-06 DIAGNOSIS — G4733 Obstructive sleep apnea (adult) (pediatric): Secondary | ICD-10-CM | POA: Diagnosis not present

## 2020-12-06 DIAGNOSIS — Z888 Allergy status to other drugs, medicaments and biological substances status: Secondary | ICD-10-CM | POA: Diagnosis not present

## 2020-12-06 DIAGNOSIS — Z833 Family history of diabetes mellitus: Secondary | ICD-10-CM | POA: Diagnosis not present

## 2020-12-06 DIAGNOSIS — Z7982 Long term (current) use of aspirin: Secondary | ICD-10-CM | POA: Diagnosis not present

## 2020-12-06 DIAGNOSIS — R112 Nausea with vomiting, unspecified: Secondary | ICD-10-CM | POA: Diagnosis not present

## 2020-12-06 DIAGNOSIS — D132 Benign neoplasm of duodenum: Secondary | ICD-10-CM | POA: Diagnosis not present

## 2020-12-06 DIAGNOSIS — Z79899 Other long term (current) drug therapy: Secondary | ICD-10-CM | POA: Diagnosis not present

## 2020-12-06 DIAGNOSIS — Z9049 Acquired absence of other specified parts of digestive tract: Secondary | ICD-10-CM | POA: Diagnosis not present

## 2020-12-06 DIAGNOSIS — Z8249 Family history of ischemic heart disease and other diseases of the circulatory system: Secondary | ICD-10-CM | POA: Diagnosis not present

## 2020-12-06 DIAGNOSIS — N4883 Acquired buried penis: Secondary | ICD-10-CM | POA: Diagnosis present

## 2020-12-06 DIAGNOSIS — Z7902 Long term (current) use of antithrombotics/antiplatelets: Secondary | ICD-10-CM | POA: Diagnosis not present

## 2020-12-06 DIAGNOSIS — R7303 Prediabetes: Secondary | ICD-10-CM | POA: Diagnosis not present

## 2020-12-06 DIAGNOSIS — E785 Hyperlipidemia, unspecified: Secondary | ICD-10-CM | POA: Diagnosis not present

## 2020-12-06 DIAGNOSIS — K219 Gastro-esophageal reflux disease without esophagitis: Secondary | ICD-10-CM | POA: Diagnosis not present

## 2020-12-06 DIAGNOSIS — D49512 Neoplasm of unspecified behavior of left kidney: Secondary | ICD-10-CM | POA: Diagnosis not present

## 2020-12-06 MED ORDER — DEXTROSE 5 % IV SOLN
3.0000 g | Freq: Once | INTRAVENOUS | Status: DC
  Filled 2020-12-06: qty 3000

## 2020-12-07 ENCOUNTER — Other Ambulatory Visit: Payer: Self-pay

## 2020-12-07 ENCOUNTER — Encounter (HOSPITAL_COMMUNITY): Payer: Self-pay | Admitting: Surgery

## 2020-12-07 ENCOUNTER — Inpatient Hospital Stay (HOSPITAL_COMMUNITY)
Admission: RE | Admit: 2020-12-07 | Discharge: 2020-12-14 | DRG: 327 | Disposition: A | Discharge status: Home Health | Payer: Medicare Other | Attending: Surgery | Admitting: Surgery

## 2020-12-07 ENCOUNTER — Encounter (HOSPITAL_COMMUNITY)
Admission: RE | Disposition: A | Discharge status: Home Health | Payer: Self-pay | Source: Home / Self Care | Attending: Surgery

## 2020-12-07 ENCOUNTER — Inpatient Hospital Stay (HOSPITAL_COMMUNITY): Payer: Medicare Other | Admitting: Vascular Surgery

## 2020-12-07 ENCOUNTER — Inpatient Hospital Stay (HOSPITAL_COMMUNITY): Payer: Medicare Other | Admitting: Certified Registered Nurse Anesthetist

## 2020-12-07 DIAGNOSIS — Z833 Family history of diabetes mellitus: Secondary | ICD-10-CM | POA: Diagnosis not present

## 2020-12-07 DIAGNOSIS — Z8249 Family history of ischemic heart disease and other diseases of the circulatory system: Secondary | ICD-10-CM | POA: Diagnosis not present

## 2020-12-07 DIAGNOSIS — N4883 Acquired buried penis: Secondary | ICD-10-CM | POA: Diagnosis present

## 2020-12-07 DIAGNOSIS — C642 Malignant neoplasm of left kidney, except renal pelvis: Secondary | ICD-10-CM | POA: Diagnosis present

## 2020-12-07 DIAGNOSIS — E669 Obesity, unspecified: Secondary | ICD-10-CM | POA: Diagnosis present

## 2020-12-07 DIAGNOSIS — Z85038 Personal history of other malignant neoplasm of large intestine: Secondary | ICD-10-CM | POA: Diagnosis not present

## 2020-12-07 DIAGNOSIS — E785 Hyperlipidemia, unspecified: Secondary | ICD-10-CM | POA: Diagnosis present

## 2020-12-07 DIAGNOSIS — Z9049 Acquired absence of other specified parts of digestive tract: Secondary | ICD-10-CM

## 2020-12-07 DIAGNOSIS — Z7982 Long term (current) use of aspirin: Secondary | ICD-10-CM

## 2020-12-07 DIAGNOSIS — K219 Gastro-esophageal reflux disease without esophagitis: Secondary | ICD-10-CM | POA: Diagnosis present

## 2020-12-07 DIAGNOSIS — Z6841 Body Mass Index (BMI) 40.0 and over, adult: Secondary | ICD-10-CM

## 2020-12-07 DIAGNOSIS — R7303 Prediabetes: Secondary | ICD-10-CM | POA: Diagnosis present

## 2020-12-07 DIAGNOSIS — Z20822 Contact with and (suspected) exposure to covid-19: Secondary | ICD-10-CM | POA: Diagnosis present

## 2020-12-07 DIAGNOSIS — I10 Essential (primary) hypertension: Secondary | ICD-10-CM | POA: Diagnosis present

## 2020-12-07 DIAGNOSIS — K567 Ileus, unspecified: Secondary | ICD-10-CM | POA: Diagnosis not present

## 2020-12-07 DIAGNOSIS — Z978 Presence of other specified devices: Secondary | ICD-10-CM

## 2020-12-07 DIAGNOSIS — F419 Anxiety disorder, unspecified: Secondary | ICD-10-CM | POA: Diagnosis present

## 2020-12-07 DIAGNOSIS — I2510 Atherosclerotic heart disease of native coronary artery without angina pectoris: Secondary | ICD-10-CM | POA: Diagnosis present

## 2020-12-07 DIAGNOSIS — F0390 Unspecified dementia without behavioral disturbance: Secondary | ICD-10-CM | POA: Diagnosis present

## 2020-12-07 DIAGNOSIS — Z7902 Long term (current) use of antithrombotics/antiplatelets: Secondary | ICD-10-CM

## 2020-12-07 DIAGNOSIS — Z888 Allergy status to other drugs, medicaments and biological substances status: Secondary | ICD-10-CM | POA: Diagnosis not present

## 2020-12-07 DIAGNOSIS — D132 Benign neoplasm of duodenum: Principal | ICD-10-CM | POA: Diagnosis present

## 2020-12-07 DIAGNOSIS — Z83438 Family history of other disorder of lipoprotein metabolism and other lipidemia: Secondary | ICD-10-CM

## 2020-12-07 DIAGNOSIS — D135 Benign neoplasm of extrahepatic bile ducts: Secondary | ICD-10-CM | POA: Diagnosis present

## 2020-12-07 DIAGNOSIS — Z79899 Other long term (current) drug therapy: Secondary | ICD-10-CM

## 2020-12-07 DIAGNOSIS — G4733 Obstructive sleep apnea (adult) (pediatric): Secondary | ICD-10-CM | POA: Diagnosis present

## 2020-12-07 HISTORY — PX: CHOLECYSTECTOMY: SHX55

## 2020-12-07 HISTORY — PX: ADRENALECTOMY: SHX876

## 2020-12-07 HISTORY — PX: NEPHRECTOMY: SHX65

## 2020-12-07 HISTORY — PX: WHIPPLE PROCEDURE: SHX2667

## 2020-12-07 LAB — POCT I-STAT 7, (LYTES, BLD GAS, ICA,H+H)
Acid-Base Excess: 3 mmol/L — ABNORMAL HIGH (ref 0.0–2.0)
Acid-Base Excess: 1 mmol/L (ref 0.0–2.0)
Acid-base deficit: 3 mmol/L — ABNORMAL HIGH (ref 0.0–2.0)
Acid-base deficit: 5 mmol/L — ABNORMAL HIGH (ref 0.0–2.0)
Acid-base deficit: 4 mmol/L — ABNORMAL HIGH (ref 0.0–2.0)
Bicarbonate: 27.1 mmol/L (ref 20.0–28.0)
Bicarbonate: 25.2 mmol/L (ref 20.0–28.0)
Bicarbonate: 22.7 mmol/L (ref 20.0–28.0)
Bicarbonate: 20.2 mmol/L (ref 20.0–28.0)
Bicarbonate: 21.8 mmol/L (ref 20.0–28.0)
Calcium, Ion: 1.07 mmol/L — ABNORMAL LOW (ref 1.15–1.40)
Calcium, Ion: 1.1 mmol/L — ABNORMAL LOW (ref 1.15–1.40)
Calcium, Ion: 1.06 mmol/L — ABNORMAL LOW (ref 1.15–1.40)
Calcium, Ion: 1.05 mmol/L — ABNORMAL LOW (ref 1.15–1.40)
Calcium, Ion: 1.08 mmol/L — ABNORMAL LOW (ref 1.15–1.40)
HCT: 39 % (ref 39.0–52.0)
HCT: 39 % (ref 39.0–52.0)
HCT: 37 % — ABNORMAL LOW (ref 39.0–52.0)
HCT: 31 % — ABNORMAL LOW (ref 39.0–52.0)
HCT: 33 % — ABNORMAL LOW (ref 39.0–52.0)
Hemoglobin: 13.3 g/dL (ref 13.0–17.0)
Hemoglobin: 13.3 g/dL (ref 13.0–17.0)
Hemoglobin: 12.6 g/dL — ABNORMAL LOW (ref 13.0–17.0)
Hemoglobin: 10.5 g/dL — ABNORMAL LOW (ref 13.0–17.0)
Hemoglobin: 11.2 g/dL — ABNORMAL LOW (ref 13.0–17.0)
O2 Saturation: 100 %
O2 Saturation: 100 %
O2 Saturation: 100 %
O2 Saturation: 100 %
O2 Saturation: 100 %
Patient temperature: 36.1
Patient temperature: 36.6
Patient temperature: 36.9
Patient temperature: 36.8
Patient temperature: 36.7
Potassium: 3.3 mmol/L — ABNORMAL LOW (ref 3.5–5.1)
Potassium: 3 mmol/L — ABNORMAL LOW (ref 3.5–5.1)
Potassium: 3.2 mmol/L — ABNORMAL LOW (ref 3.5–5.1)
Potassium: 3.4 mmol/L — ABNORMAL LOW (ref 3.5–5.1)
Potassium: 3.5 mmol/L (ref 3.5–5.1)
Sodium: 140 mmol/L (ref 135–145)
Sodium: 140 mmol/L (ref 135–145)
Sodium: 141 mmol/L (ref 135–145)
Sodium: 141 mmol/L (ref 135–145)
Sodium: 141 mmol/L (ref 135–145)
TCO2: 28 mmol/L (ref 22–32)
TCO2: 26 mmol/L (ref 22–32)
TCO2: 24 mmol/L (ref 22–32)
TCO2: 21 mmol/L — ABNORMAL LOW (ref 22–32)
TCO2: 23 mmol/L (ref 22–32)
pCO2 arterial: 38.8 mmHg (ref 32.0–48.0)
pCO2 arterial: 38.8 mmHg (ref 32.0–48.0)
pCO2 arterial: 40.1 mmHg (ref 32.0–48.0)
pCO2 arterial: 36.8 mmHg (ref 32.0–48.0)
pCO2 arterial: 42.1 mmHg (ref 32.0–48.0)
pH, Arterial: 7.449 (ref 7.350–7.450)
pH, Arterial: 7.419 (ref 7.350–7.450)
pH, Arterial: 7.361 (ref 7.350–7.450)
pH, Arterial: 7.347 — ABNORMAL LOW (ref 7.350–7.450)
pH, Arterial: 7.32 — ABNORMAL LOW (ref 7.350–7.450)
pO2, Arterial: 228 mmHg — ABNORMAL HIGH (ref 83.0–108.0)
pO2, Arterial: 239 mmHg — ABNORMAL HIGH (ref 83.0–108.0)
pO2, Arterial: 208 mmHg — ABNORMAL HIGH (ref 83.0–108.0)
pO2, Arterial: 195 mmHg — ABNORMAL HIGH (ref 83.0–108.0)
pO2, Arterial: 212 mmHg — ABNORMAL HIGH (ref 83.0–108.0)

## 2020-12-07 LAB — COMPREHENSIVE METABOLIC PANEL
ALT: 57 U/L — ABNORMAL HIGH (ref 0–44)
AST: 70 U/L — ABNORMAL HIGH (ref 15–41)
Albumin: 2.9 g/dL — ABNORMAL LOW (ref 3.5–5.0)
Alkaline Phosphatase: 61 U/L (ref 38–126)
Anion gap: 18 — ABNORMAL HIGH (ref 5–15)
BUN: 15 mg/dL (ref 8–23)
CO2: 16 mmol/L — ABNORMAL LOW (ref 22–32)
Calcium: 8.3 mg/dL — ABNORMAL LOW (ref 8.9–10.3)
Chloride: 104 mmol/L (ref 98–111)
Creatinine, Ser: 1.28 mg/dL — ABNORMAL HIGH (ref 0.61–1.24)
GFR, Estimated: 58 mL/min — ABNORMAL LOW (ref >60)
Glucose, Bld: 174 mg/dL — ABNORMAL HIGH (ref 70–99)
Potassium: 3.4 mmol/L — ABNORMAL LOW (ref 3.5–5.1)
Sodium: 138 mmol/L (ref 135–145)
Total Bilirubin: 0.9 mg/dL (ref 0.3–1.2)
Total Protein: 5.2 g/dL — ABNORMAL LOW (ref 6.5–8.1)

## 2020-12-07 LAB — GLUCOSE, CAPILLARY
Glucose-Capillary: 145 mg/dL — ABNORMAL HIGH (ref 70–99)
Glucose-Capillary: 157 mg/dL — ABNORMAL HIGH (ref 70–99)
Glucose-Capillary: 197 mg/dL — ABNORMAL HIGH (ref 70–99)
Glucose-Capillary: 141 mg/dL — ABNORMAL HIGH (ref 70–99)

## 2020-12-07 LAB — CBC
HCT: 37.5 % — ABNORMAL LOW (ref 39.0–52.0)
Hemoglobin: 11.5 g/dL — ABNORMAL LOW (ref 13.0–17.0)
MCH: 28 pg (ref 26.0–34.0)
MCHC: 30.7 g/dL (ref 30.0–36.0)
MCV: 91.5 fL (ref 80.0–100.0)
Platelets: 229 10*3/uL (ref 150–400)
RBC: 4.1 MIL/uL — ABNORMAL LOW (ref 4.22–5.81)
RDW: 15.3 % (ref 11.5–15.5)
WBC: 13 10*3/uL — ABNORMAL HIGH (ref 4.0–10.5)
nRBC: 0 % (ref 0.0–0.2)

## 2020-12-07 LAB — PREPARE RBC (CROSSMATCH)

## 2020-12-07 LAB — SARS CORONAVIRUS 2 (TAT 6-24 HRS): SARS Coronavirus 2: NEGATIVE

## 2020-12-07 LAB — ABO/RH: ABO/RH(D): A POS

## 2020-12-07 SURGERY — WHIPPLE PROCEDURE
Anesthesia: General | Site: Abdomen

## 2020-12-07 MED ORDER — EPHEDRINE SULFATE-NACL 50-0.9 MG/10ML-% IV SOSY
PREFILLED_SYRINGE | INTRAVENOUS | Status: DC | PRN
  Administered 2020-12-07 (×5): 5 mg via INTRAVENOUS
  Administered 2020-12-07: 15 mg via INTRAVENOUS
  Administered 2020-12-07: 10 mg via INTRAVENOUS
  Administered 2020-12-07 (×2): 5 mg via INTRAVENOUS
  Administered 2020-12-07: 10 mg via INTRAVENOUS

## 2020-12-07 MED ORDER — FENTANYL CITRATE (PF) 250 MCG/5ML IJ SOLN
INTRAMUSCULAR | Status: DC | PRN
  Administered 2020-12-07: 50 ug via INTRAVENOUS
  Administered 2020-12-07: 100 ug via INTRAVENOUS
  Administered 2020-12-07: 50 ug via INTRAVENOUS
  Administered 2020-12-07: 150 ug via INTRAVENOUS
  Administered 2020-12-07: 50 ug via INTRAVENOUS

## 2020-12-07 MED ORDER — METOPROLOL TARTRATE 5 MG/5ML IV SOLN
5.0000 mg | Freq: Four times a day (QID) | INTRAVENOUS | Status: DC
  Administered 2020-12-07 – 2020-12-08 (×4): 5 mg via INTRAVENOUS
  Filled 2020-12-07 (×4): qty 5

## 2020-12-07 MED ORDER — MIDAZOLAM HCL 2 MG/2ML IJ SOLN
INTRAMUSCULAR | Status: AC
  Filled 2020-12-07: qty 2

## 2020-12-07 MED ORDER — ONDANSETRON HCL 4 MG/2ML IJ SOLN
INTRAMUSCULAR | Status: AC
  Filled 2020-12-07: qty 2

## 2020-12-07 MED ORDER — LIDOCAINE IN D5W 4-5 MG/ML-% IV SOLN
1.0000 mg/min | INTRAVENOUS | Status: DC
  Filled 2020-12-07: qty 500

## 2020-12-07 MED ORDER — CHLORHEXIDINE GLUCONATE CLOTH 2 % EX PADS
6.0000 | MEDICATED_PAD | Freq: Every day | CUTANEOUS | Status: DC
  Administered 2020-12-08 – 2020-12-14 (×7): 6 via TOPICAL

## 2020-12-07 MED ORDER — ROCURONIUM BROMIDE 10 MG/ML (PF) SYRINGE
PREFILLED_SYRINGE | INTRAVENOUS | Status: AC
  Filled 2020-12-07: qty 10

## 2020-12-07 MED ORDER — FENTANYL CITRATE (PF) 250 MCG/5ML IJ SOLN
INTRAMUSCULAR | Status: AC
  Filled 2020-12-07: qty 5

## 2020-12-07 MED ORDER — MIDAZOLAM HCL 5 MG/5ML IJ SOLN
INTRAMUSCULAR | Status: DC | PRN
  Administered 2020-12-07 (×2): 1 mg via INTRAVENOUS
  Administered 2020-12-07: 2 mg via INTRAVENOUS

## 2020-12-07 MED ORDER — LIDOCAINE IN D5W 4-5 MG/ML-% IV SOLN
25.0000 ug/kg/min | INTRAVENOUS | Status: AC
  Administered 2020-12-07: 25 ug/kg/min via INTRAVENOUS
  Filled 2020-12-07: qty 500

## 2020-12-07 MED ORDER — INSULIN ASPART 100 UNIT/ML ~~LOC~~ SOLN
0.0000 [IU] | SUBCUTANEOUS | Status: DC
  Administered 2020-12-07: 3 [IU] via SUBCUTANEOUS
  Administered 2020-12-08 – 2020-12-14 (×4): 2 [IU] via SUBCUTANEOUS

## 2020-12-07 MED ORDER — PHENYLEPHRINE HCL-NACL 10-0.9 MG/250ML-% IV SOLN
INTRAVENOUS | Status: DC | PRN
  Administered 2020-12-07: 40 ug/min via INTRAVENOUS

## 2020-12-07 MED ORDER — NALOXONE HCL 0.4 MG/ML IJ SOLN: 0.4000 mg | INTRAMUSCULAR | Status: DC | PRN

## 2020-12-07 MED ORDER — DEXAMETHASONE SODIUM PHOSPHATE 10 MG/ML IJ SOLN
INTRAMUSCULAR | Status: DC | PRN
  Administered 2020-12-07: 6 mg via INTRAVENOUS

## 2020-12-07 MED ORDER — 0.9 % SODIUM CHLORIDE (POUR BTL) OPTIME
TOPICAL | Status: DC | PRN
  Administered 2020-12-07 (×5): 1000 mL

## 2020-12-07 MED ORDER — NOREPINEPHRINE 4 MG/250ML-% IV SOLN
0.0000 ug/min | INTRAVENOUS | Status: DC
  Filled 2020-12-07: qty 250

## 2020-12-07 MED ORDER — SODIUM CHLORIDE 0.9 % IV SOLN
2.0000 g | INTRAVENOUS | Status: DC
  Filled 2020-12-07: qty 20

## 2020-12-07 MED ORDER — LACTATED RINGERS IV SOLN: INTRAVENOUS | Status: DC | PRN

## 2020-12-07 MED ORDER — HYDROMORPHONE HCL 1 MG/ML IJ SOLN: 0.2500 mg | INTRAMUSCULAR | Status: DC | PRN

## 2020-12-07 MED ORDER — PROPOFOL 10 MG/ML IV BOLUS
INTRAVENOUS | Status: AC
  Filled 2020-12-07: qty 20

## 2020-12-07 MED ORDER — CEFAZOLIN SODIUM 1 G IJ SOLR
INTRAMUSCULAR | Status: AC
  Filled 2020-12-07: qty 30

## 2020-12-07 MED ORDER — LACTATED RINGERS IV SOLN: INTRAVENOUS | Status: DC

## 2020-12-07 MED ORDER — ONDANSETRON HCL 4 MG/2ML IJ SOLN
4.0000 mg | Freq: Four times a day (QID) | INTRAMUSCULAR | Status: DC | PRN
  Administered 2020-12-08 – 2020-12-12 (×2): 4 mg via INTRAVENOUS
  Filled 2020-12-07 (×2): qty 2

## 2020-12-07 MED ORDER — CALCIUM CHLORIDE 10 % IV SOLN
INTRAVENOUS | Status: DC | PRN
  Administered 2020-12-07 (×10): 100 mg via INTRAVENOUS

## 2020-12-07 MED ORDER — DEXTROSE 5 % IV SOLN
INTRAVENOUS | Status: DC | PRN
  Administered 2020-12-07 (×2): 3 g via INTRAVENOUS

## 2020-12-07 MED ORDER — PANTOPRAZOLE SODIUM 40 MG IV SOLR
40.0000 mg | Freq: Every day | INTRAVENOUS | Status: DC
  Administered 2020-12-08 – 2020-12-11 (×4): 40 mg via INTRAVENOUS
  Filled 2020-12-07 (×4): qty 40

## 2020-12-07 MED ORDER — DIPHENHYDRAMINE HCL 12.5 MG/5ML PO ELIX
12.5000 mg | ORAL_SOLUTION | Freq: Four times a day (QID) | ORAL | Status: DC | PRN
  Filled 2020-12-07: qty 5

## 2020-12-07 MED ORDER — VISTASEAL 10 ML SINGLE DOSE KIT
10.0000 mL | PACK | CUTANEOUS | Status: DC
  Filled 2020-12-07: qty 10

## 2020-12-07 MED ORDER — VASOPRESSIN 20 UNIT/ML IV SOLN
INTRAVENOUS | Status: DC | PRN
  Administered 2020-12-07 (×5): 1 [IU] via INTRAVENOUS

## 2020-12-07 MED ORDER — CHLORHEXIDINE GLUCONATE 0.12 % MT SOLN: 15.0000 mL | Freq: Once | OROMUCOSAL | Status: DC

## 2020-12-07 MED ORDER — HYDROMORPHONE 1 MG/ML IV SOLN
INTRAVENOUS | Status: DC
Start: 2020-12-07 — End: 2020-12-12
  Administered 2020-12-07: 30 mg via INTRAVENOUS
  Administered 2020-12-07: 1 mg via INTRAVENOUS
  Administered 2020-12-07: 0.6 mg via INTRAVENOUS
  Administered 2020-12-08: 0 mg via INTRAVENOUS
  Administered 2020-12-08: 0.2 mg via INTRAVENOUS
  Administered 2020-12-08: 0.4 mg via INTRAVENOUS
  Administered 2020-12-08 (×2): 0.2 mg via INTRAVENOUS
  Administered 2020-12-09: 0.4 mg via INTRAVENOUS
  Administered 2020-12-09: 0.2 mg via INTRAVENOUS
  Administered 2020-12-09: 0.4 mg via INTRAVENOUS
  Administered 2020-12-09: 0.2 mg via INTRAVENOUS
  Administered 2020-12-09: 0.4 mg via INTRAVENOUS
  Administered 2020-12-09: 0 mg via INTRAVENOUS
  Administered 2020-12-10: 0.4 mg via INTRAVENOUS
  Administered 2020-12-10: 0 mg via INTRAVENOUS
  Administered 2020-12-10: 0.2 mg via INTRAVENOUS
  Administered 2020-12-10: 0 mL via INTRAVENOUS
  Administered 2020-12-10: 0.4 mL via INTRAVENOUS
  Administered 2020-12-11 (×2): 0.2 mg via INTRAVENOUS
  Administered 2020-12-11: 0 mg via INTRAVENOUS
  Administered 2020-12-11: 0.2 mg via INTRAVENOUS
  Administered 2020-12-12 (×2): 0.4 mg via INTRAVENOUS

## 2020-12-07 MED ORDER — ALBUMIN HUMAN 5 % IV SOLN: INTRAVENOUS | Status: DC | PRN

## 2020-12-07 MED ORDER — CALCIUM CHLORIDE 10 % IV SOLN
INTRAVENOUS | Status: AC
  Filled 2020-12-07: qty 10

## 2020-12-07 MED ORDER — EPHEDRINE 5 MG/ML INJ
INTRAVENOUS | Status: AC
  Filled 2020-12-07: qty 10

## 2020-12-07 MED ORDER — MEPERIDINE HCL 25 MG/ML IJ SOLN
6.2500 mg | INTRAMUSCULAR | Status: DC | PRN
Start: 2020-12-07 — End: 2020-12-07

## 2020-12-07 MED ORDER — SUGAMMADEX SODIUM 200 MG/2ML IV SOLN
INTRAVENOUS | Status: DC | PRN
  Administered 2020-12-07: 100 mg via INTRAVENOUS
  Administered 2020-12-07: 200 mg via INTRAVENOUS

## 2020-12-07 MED ORDER — KETAMINE HCL 10 MG/ML IJ SOLN
INTRAMUSCULAR | Status: DC | PRN
  Administered 2020-12-07 (×5): 10 mg via INTRAVENOUS
  Administered 2020-12-07: 50 mg via INTRAVENOUS

## 2020-12-07 MED ORDER — KETAMINE HCL 50 MG/5ML IJ SOSY
PREFILLED_SYRINGE | INTRAMUSCULAR | Status: AC
  Filled 2020-12-07: qty 10

## 2020-12-07 MED ORDER — SODIUM CHLORIDE 0.9 % IV SOLN: INTRAVENOUS | Status: DC | PRN

## 2020-12-07 MED ORDER — SODIUM CHLORIDE 0.9% FLUSH: 9.0000 mL | INTRAVENOUS | Status: DC | PRN

## 2020-12-07 MED ORDER — HEMOSTATIC AGENTS (NO CHARGE) OPTIME
TOPICAL | Status: DC | PRN
  Administered 2020-12-07: 1 via TOPICAL

## 2020-12-07 MED ORDER — HEPARIN SODIUM (PORCINE) 5000 UNIT/ML IJ SOLN
5000.0000 [IU] | Freq: Three times a day (TID) | INTRAMUSCULAR | Status: DC
  Administered 2020-12-08: 5000 [IU] via SUBCUTANEOUS
  Filled 2020-12-07: qty 1

## 2020-12-07 MED ORDER — CHLORHEXIDINE GLUCONATE 0.12 % MT SOLN
OROMUCOSAL | Status: AC
  Administered 2020-12-07: 15 mL
  Filled 2020-12-07: qty 15

## 2020-12-07 MED ORDER — ROCURONIUM BROMIDE 10 MG/ML (PF) SYRINGE
PREFILLED_SYRINGE | INTRAVENOUS | Status: AC
  Filled 2020-12-07: qty 20

## 2020-12-07 MED ORDER — EPHEDRINE 5 MG/ML INJ
INTRAVENOUS | Status: AC
  Filled 2020-12-07: qty 20

## 2020-12-07 MED ORDER — SUCCINYLCHOLINE CHLORIDE 20 MG/ML IJ SOLN: INTRAMUSCULAR | Status: DC | PRN

## 2020-12-07 MED ORDER — LIDOCAINE 2% (20 MG/ML) 5 ML SYRINGE
INTRAMUSCULAR | Status: DC | PRN
  Administered 2020-12-07: 100 mg via INTRAVENOUS

## 2020-12-07 MED ORDER — METRONIDAZOLE IN NACL 5-0.79 MG/ML-% IV SOLN
500.0000 mg | INTRAVENOUS | Status: AC
  Administered 2020-12-07: 500 mg via INTRAVENOUS
  Filled 2020-12-07: qty 100

## 2020-12-07 MED ORDER — PROPOFOL 10 MG/ML IV BOLUS
INTRAVENOUS | Status: DC | PRN
  Administered 2020-12-07: 50 mg via INTRAVENOUS
  Administered 2020-12-07: 150 mg via INTRAVENOUS
  Administered 2020-12-07: 40 mg via INTRAVENOUS

## 2020-12-07 MED ORDER — PHENYLEPHRINE 40 MCG/ML (10ML) SYRINGE FOR IV PUSH (FOR BLOOD PRESSURE SUPPORT)
PREFILLED_SYRINGE | INTRAVENOUS | Status: AC
  Filled 2020-12-07: qty 20

## 2020-12-07 MED ORDER — DEXAMETHASONE SODIUM PHOSPHATE 10 MG/ML IJ SOLN
INTRAMUSCULAR | Status: AC
  Filled 2020-12-07: qty 1

## 2020-12-07 MED ORDER — PHENYLEPHRINE 40 MCG/ML (10ML) SYRINGE FOR IV PUSH (FOR BLOOD PRESSURE SUPPORT)
PREFILLED_SYRINGE | INTRAVENOUS | Status: AC
  Filled 2020-12-07: qty 10

## 2020-12-07 MED ORDER — ORAL CARE MOUTH RINSE: 15.0000 mL | Freq: Once | OROMUCOSAL | Status: DC

## 2020-12-07 MED ORDER — ARTIFICIAL TEARS OPHTHALMIC OINT
TOPICAL_OINTMENT | OPHTHALMIC | Status: DC | PRN
  Administered 2020-12-07: 1 via OPHTHALMIC

## 2020-12-07 MED ORDER — PHENYLEPHRINE 40 MCG/ML (10ML) SYRINGE FOR IV PUSH (FOR BLOOD PRESSURE SUPPORT)
PREFILLED_SYRINGE | INTRAVENOUS | Status: DC | PRN
  Administered 2020-12-07 (×3): 80 ug via INTRAVENOUS
  Administered 2020-12-07 (×2): 40 ug via INTRAVENOUS
  Administered 2020-12-07 (×2): 80 ug via INTRAVENOUS
  Administered 2020-12-07: 40 ug via INTRAVENOUS
  Administered 2020-12-07: 80 ug via INTRAVENOUS
  Administered 2020-12-07: 40 ug via INTRAVENOUS
  Administered 2020-12-07 (×2): 80 ug via INTRAVENOUS
  Administered 2020-12-07: 40 ug via INTRAVENOUS
  Administered 2020-12-07: 80 ug via INTRAVENOUS

## 2020-12-07 MED ORDER — VASOPRESSIN 20 UNIT/ML IV SOLN
INTRAVENOUS | Status: AC
  Filled 2020-12-07: qty 1

## 2020-12-07 MED ORDER — ARTIFICIAL TEARS OPHTHALMIC OINT
TOPICAL_OINTMENT | OPHTHALMIC | Status: AC
  Filled 2020-12-07: qty 3.5

## 2020-12-07 MED ORDER — HYDROMORPHONE 1 MG/ML IV SOLN
INTRAVENOUS | Status: AC
  Filled 2020-12-07: qty 30

## 2020-12-07 MED ORDER — ROCURONIUM BROMIDE 100 MG/10ML IV SOLN
INTRAVENOUS | Status: DC | PRN
  Administered 2020-12-07 (×3): 30 mg via INTRAVENOUS
  Administered 2020-12-07 (×2): 20 mg via INTRAVENOUS
  Administered 2020-12-07: 70 mg via INTRAVENOUS
  Administered 2020-12-07: 30 mg via INTRAVENOUS
  Administered 2020-12-07: 20 mg via INTRAVENOUS
  Administered 2020-12-07: 50 mg via INTRAVENOUS
  Administered 2020-12-07: 20 mg via INTRAVENOUS

## 2020-12-07 MED ORDER — DIPHENHYDRAMINE HCL 50 MG/ML IJ SOLN: 12.5000 mg | Freq: Four times a day (QID) | INTRAMUSCULAR | Status: DC | PRN

## 2020-12-07 MED ORDER — PHENYLEPHRINE HCL (PRESSORS) 10 MG/ML IV SOLN
INTRAVENOUS | Status: AC
  Filled 2020-12-07: qty 1

## 2020-12-07 MED ORDER — SUGAMMADEX SODIUM 500 MG/5ML IV SOLN
INTRAVENOUS | Status: AC
  Filled 2020-12-07: qty 5

## 2020-12-07 MED ORDER — LIDOCAINE 2% (20 MG/ML) 5 ML SYRINGE
INTRAMUSCULAR | Status: AC
  Filled 2020-12-07: qty 5

## 2020-12-07 MED ORDER — PROMETHAZINE HCL 25 MG/ML IJ SOLN: 6.2500 mg | INTRAMUSCULAR | Status: DC | PRN

## 2020-12-07 MED ORDER — LACTATED RINGERS IV SOLN
INTRAVENOUS | Status: DC | PRN
Start: 2020-12-07 — End: 2020-12-07

## 2020-12-07 MED ORDER — ONDANSETRON HCL 4 MG/2ML IJ SOLN
INTRAMUSCULAR | Status: DC | PRN
  Administered 2020-12-07: 4 mg via INTRAVENOUS

## 2020-12-07 SURGICAL SUPPLY — 125 items
AGENT HMST KT MTR STRL THRMB (HEMOSTASIS) ×3
AGENT HMST MTR 8 SURGIFLO (HEMOSTASIS)
APL PRP STRL LF DISP 70% ISPRP (MISCELLANEOUS) ×3
BAG BILE T-TUBES STRL (MISCELLANEOUS) ×4 IMPLANT
BAG DRN 9.5 2 ADJ BELT ADPR (MISCELLANEOUS)
BIOPATCH RED 1 DISK 7.0 (GAUZE/BANDAGES/DRESSINGS) ×6 IMPLANT
BLADE CLIPPER SURG (BLADE) ×2 IMPLANT
BLADE SURG 10 STRL SS (BLADE) ×2 IMPLANT
BLADE SURG 11 STRL SS (BLADE) ×2 IMPLANT
BOOT SUTURE AID YELLOW STND (SUTURE) ×6 IMPLANT
CANISTER SUCT 3000ML PPV (MISCELLANEOUS) ×2 IMPLANT
CANNULA VESSEL W/WING WO/VALVE (CANNULA) IMPLANT
CATH EMB 4FR 40CM (CATHETERS) ×2 IMPLANT
CHLORAPREP W/TINT 26 (MISCELLANEOUS) ×6 IMPLANT
CLIP VESOCCLUDE LG 6/CT (CLIP) ×4 IMPLANT
CLIP VESOCCLUDE MED 24/CT (CLIP) ×4 IMPLANT
CLIP VESOLOCK LG 6/CT PURPLE (CLIP) ×10 IMPLANT
CLIP VESOLOCK MED 6/CT (CLIP) ×4 IMPLANT
CLIP VESOLOCK MED LG 6/CT (CLIP) ×6 IMPLANT
COUNTER NEEDLE 20 DBL MAG RED (NEEDLE) IMPLANT
COVER MAYO STAND STRL (DRAPES) ×4 IMPLANT
COVER SURGICAL LIGHT HANDLE (MISCELLANEOUS) ×8 IMPLANT
CUTTER ECHEON FLEX ENDO 45 340 (ENDOMECHANICALS) ×2 IMPLANT
DRAIN CHANNEL 15F RND FF W/TCR (WOUND CARE) IMPLANT
DRAIN CHANNEL 19F RND (DRAIN) ×6 IMPLANT
DRAPE INCISE IOBAN 66X45 STRL (DRAPES) ×4 IMPLANT
DRAPE LAPAROSCOPIC ABDOMINAL (DRAPES) ×2 IMPLANT
DRAPE SLUSH MACHINE 52X66 (DRAPES) IMPLANT
DRAPE SLUSH/WARMER DISC (DRAPES) ×2 IMPLANT
DRAPE WARM FLUID 44X44 (DRAPES) ×6 IMPLANT
DRSG COVADERM 4X10 (GAUZE/BANDAGES/DRESSINGS) IMPLANT
DRSG COVADERM 4X14 (GAUZE/BANDAGES/DRESSINGS) ×2 IMPLANT
DRSG COVADERM 4X6 (GAUZE/BANDAGES/DRESSINGS) IMPLANT
DRSG COVADERM 4X8 (GAUZE/BANDAGES/DRESSINGS) ×2 IMPLANT
DRSG TEGADERM 4X4.75 (GAUZE/BANDAGES/DRESSINGS) ×4 IMPLANT
DRSG TELFA 3X8 NADH (GAUZE/BANDAGES/DRESSINGS) IMPLANT
ELECT BLADE 4.0 EZ CLEAN MEGAD (MISCELLANEOUS) ×4
ELECT BLADE 6.5 EXT (BLADE) ×4 IMPLANT
ELECT CAUTERY BLADE 6.4 (BLADE) ×4 IMPLANT
ELECT NDL BLADE 2-5/6 (NEEDLE) IMPLANT
ELECT NEEDLE BLADE 2-5/6 (NEEDLE) ×4 IMPLANT
ELECT PAD DSPR THERM+ ADLT (MISCELLANEOUS) ×6 IMPLANT
ELECT REM PT RETURN 9FT ADLT (ELECTROSURGICAL) ×4
ELECTRODE BLDE 4.0 EZ CLN MEGD (MISCELLANEOUS) ×1 IMPLANT
ELECTRODE REM PT RTRN 9FT ADLT (ELECTROSURGICAL) ×5 IMPLANT
EVACUATOR SILICONE 100CC (DRAIN) ×2 IMPLANT
FELT TEFLON 1X6 (MISCELLANEOUS) IMPLANT
GAUZE 4X4 16PLY RFD (DISPOSABLE) ×2 IMPLANT
GAUZE SPONGE 4X4 12PLY STRL (GAUZE/BANDAGES/DRESSINGS) ×4 IMPLANT
GEL ULTRASOUND 20GR AQUASONIC (MISCELLANEOUS) IMPLANT
GLOVE BIO SURGEON STRL SZ 6 (GLOVE) ×8 IMPLANT
GLOVE BIO SURGEON STRL SZ 6.5 (GLOVE) ×2 IMPLANT
GLOVE BIO SURGEON STRL SZ7 (GLOVE) ×8 IMPLANT
GLOVE BIOGEL PI IND STRL 6 (GLOVE) ×2 IMPLANT
GLOVE BIOGEL PI IND STRL 7.0 (GLOVE) ×2 IMPLANT
GLOVE BIOGEL PI INDICATOR 6 (GLOVE) ×2
GLOVE BIOGEL PI INDICATOR 7.0 (GLOVE) ×2
GLOVE INDICATOR 6.5 STRL GRN (GLOVE) ×8 IMPLANT
GLOVE SURG ENC TEXT LTX SZ7.5 (GLOVE) ×2 IMPLANT
GLOVE SURG POLY MICRO LF SZ5.5 (GLOVE) ×4 IMPLANT
GOWN STRL REUS W/ TWL LRG LVL3 (GOWN DISPOSABLE) ×16 IMPLANT
GOWN STRL REUS W/TWL 2XL LVL3 (GOWN DISPOSABLE) ×10 IMPLANT
GOWN STRL REUS W/TWL LRG LVL3 (GOWN DISPOSABLE) ×24
HAND PENCIL TRP OPTION (MISCELLANEOUS) ×2 IMPLANT
HANDLE SUCTION POOLE (INSTRUMENTS) ×1 IMPLANT
KIT BASIN OR (CUSTOM PROCEDURE TRAY) ×6 IMPLANT
KIT MARKER MARGIN INK (KITS) ×2 IMPLANT
KIT TURNOVER KIT B (KITS) ×4 IMPLANT
LIGASURE IMPACT 36 18CM CVD LR (INSTRUMENTS) ×2 IMPLANT
LOOP VESSEL MAXI BLUE (MISCELLANEOUS) ×6 IMPLANT
LOOP VESSEL MINI RED (MISCELLANEOUS) ×4 IMPLANT
NEEDLE 22X1 1/2 (OR ONLY) (NEEDLE) ×2 IMPLANT
NS IRRIG 1000ML POUR BTL (IV SOLUTION) ×14 IMPLANT
PACK GENERAL/GYN (CUSTOM PROCEDURE TRAY) ×6 IMPLANT
PAD ARMBOARD 7.5X6 YLW CONV (MISCELLANEOUS) ×6 IMPLANT
PAD DRESSING TELFA 3X8 NADH (GAUZE/BANDAGES/DRESSINGS) IMPLANT
PENCIL SMOKE EVACUATOR (MISCELLANEOUS) ×4 IMPLANT
PLUG CATH AND CAP STER (CATHETERS) IMPLANT
PROTECTOR NERVE ULNAR (MISCELLANEOUS) ×4 IMPLANT
RELOAD STAPLE 45 2.6 WHT THIN (STAPLE) IMPLANT
SHEARS FOC LG CVD HARMONIC 17C (MISCELLANEOUS) ×4 IMPLANT
SLEEVE SUCTION 125 (MISCELLANEOUS) ×2 IMPLANT
SLEEVE SUCTION CATH 165 (SLEEVE) ×2 IMPLANT
SPOGE SURGIFLO 8M (HEMOSTASIS)
SPONGE LAP 18X18 RF (DISPOSABLE) ×22 IMPLANT
SPONGE SURGIFLO 8M (HEMOSTASIS) IMPLANT
SPONGE SURGIFOAM ABS GEL 100 (HEMOSTASIS) IMPLANT
STAPLE RELOAD 45 WHT (STAPLE) ×12 IMPLANT
STAPLE RELOAD 45MM WHITE (STAPLE) ×16
STAPLER VISISTAT 35W (STAPLE) ×8 IMPLANT
SUCTION POOLE HANDLE (INSTRUMENTS) ×4
SURGIFLO W/THROMBIN 8M KIT (HEMOSTASIS) ×2 IMPLANT
SUT ETHILON 2 0 FS 18 (SUTURE) ×8 IMPLANT
SUT PDS AB 1 TP1 96 (SUTURE) ×4 IMPLANT
SUT PDS AB 4-0 RB1 27 (SUTURE) ×22 IMPLANT
SUT PROLENE 1 XLH 60 (SUTURE) ×4 IMPLANT
SUT PROLENE 3 0 SH 48 (SUTURE) ×10 IMPLANT
SUT PROLENE 4 0 RB 1 (SUTURE) ×8
SUT PROLENE 4-0 RB1 .5 CRCL 36 (SUTURE) ×6 IMPLANT
SUT PROLENE 5 0 CC 1 (SUTURE) IMPLANT
SUT SILK 0 TIES 10X30 (SUTURE) ×4 IMPLANT
SUT SILK 2 0 SH CR/8 (SUTURE) ×4 IMPLANT
SUT SILK 2 0 TIES 10X30 (SUTURE) ×4 IMPLANT
SUT SILK 3 0 SH CR/8 (SUTURE) ×4 IMPLANT
SUT SILK 3 0 TIES 10X30 (SUTURE) ×4 IMPLANT
SUT SILK 3 0SH CR/8 30 (SUTURE) ×2 IMPLANT
SUT VIC AB 2-0 SH 27 (SUTURE)
SUT VIC AB 2-0 SH 27X BRD (SUTURE) ×8 IMPLANT
SUT VIC AB 3-0 MH 27 (SUTURE) ×4 IMPLANT
SUT VIC AB 3-0 SH 27 (SUTURE) ×4
SUT VIC AB 3-0 SH 27X BRD (SUTURE) ×1 IMPLANT
SUT VIC AB 5-0 RB1 27 (SUTURE) ×46 IMPLANT
SYR 10ML LL (SYRINGE) ×2 IMPLANT
SYR 3ML LL SCALE MARK (SYRINGE) ×2 IMPLANT
TAPE CLOTH SURG 4X10 WHT LF (GAUZE/BANDAGES/DRESSINGS) ×2 IMPLANT
TAPE UMBILICAL 1/8 X36 TWILL (MISCELLANEOUS) IMPLANT
TOWEL GREEN STERILE (TOWEL DISPOSABLE) ×4 IMPLANT
TOWEL GREEN STERILE FF (TOWEL DISPOSABLE) ×8 IMPLANT
TRAY FOLEY MTR SLVR 14FR STAT (SET/KITS/TRAYS/PACK) ×4 IMPLANT
TRAY FOLEY MTR SLVR 16FR STAT (SET/KITS/TRAYS/PACK) ×2 IMPLANT
TUBE CONNECTING 12X1/4 (SUCTIONS) ×6 IMPLANT
TUBE FEEDING 8FR 16IN STR KANG (MISCELLANEOUS) IMPLANT
TUBE FEEDING ENTERAL 5FR 16IN (TUBING) IMPLANT
WATER STERILE IRR 1000ML POUR (IV SOLUTION) ×4 IMPLANT
YANKAUER SUCT BULB TIP NO VENT (SUCTIONS) ×4 IMPLANT

## 2020-12-07 NOTE — Progress Notes (Signed)
Pt admitted to [address] A & O. Dilaudid PCA in place. Daughter [name] was called, she is on the way.

## 2020-12-07 NOTE — Anesthesia Procedure Notes (Signed)
Procedure Name: Intubation Date/Time: 12/07/2020 7:41 AM Performed by: Janene Harvey, CRNA Pre-anesthesia Checklist: Patient identified, Emergency Drugs available, Suction available and Patient being monitored Patient Re-evaluated:Patient Re-evaluated prior to induction Oxygen Delivery Method: Circle system utilized Preoxygenation: Pre-oxygenation with 100% oxygen Induction Type: IV induction Ventilation: Mask ventilation without difficulty, Two handed mask ventilation required and Oral airway inserted - appropriate to patient size Laryngoscope Size: Mac and 4 Grade View: Grade II Tube type: Oral Tube size: 8.0 mm Number of attempts: 1 Airway Equipment and Method: Stylet and Oral airway Placement Confirmation: ETT inserted through vocal cords under direct vision,  positive ETCO2 and breath sounds checked- equal and bilateral Secured at: 23 cm Tube secured with: Tape Dental Injury: Teeth and Oropharynx as per pre-operative assessment

## 2020-12-07 NOTE — Anesthesia Procedure Notes (Signed)
Arterial Line Insertion Start/End12/28/2021 6:55 AM Performed by: Audie Pinto, CRNA  Preanesthetic checklist: patient identified, risks and benefits discussed and pre-op evaluation Lidocaine 1% used for infiltration Right, radial was placed Catheter size: 20 G Hand hygiene performed  and maximum sterile barriers used  Allen's test indicative of satisfactory collateral circulation Attempts: 1 Procedure performed without using ultrasound guided technique. Following insertion, dressing applied and Biopatch. Post procedure assessment: normal  Patient tolerated the procedure well with no immediate complications.

## 2020-12-07 NOTE — Op Note (Signed)
Date: 12/07/20  Patient: Justin Davidson MRN: [id number]  Preoperative Diagnosis: Periampullary duodenal adenoma, left renal cancer Postoperative Diagnosis: Same  Procedure: 1. Cholecystectomy 2. Transduodenal ampullectomy  Surgeon: Michaelle Birks, MD Assistant: Stark Klein, MD   Co-Surgeon: Rexene Alberts, MD  EBL: 200 mL  Anesthesia: General  Specimens: Gallbladder, duodenal adenoma  Indications: Justin Davidson is a 74 yo male with a periampullary duodenal mass. Previous endoscopic resection was attempted but was not successful due to the size of the lesion. Multiple biopsies confirmed a duodenal adenoma with no dysplasia or carcinoma. EUS showed involvement of the muscularis mucosa, with no malignant appearing lymph nodes. MRCP was done for further workup, which incidentally showed a left renal mass suspicious for malignancy. An extensive discussion was held with the patient and his family regarding local resection of the duodenal mass, with possible Whipple if any intraop concern for malignancy was identified. Concurrent resection of the left renal mass was planned with urology.  Findings: Pedunculated periampullary mass, approximately 4cm in diameter, soft and benign in appearance. Frozen analysis negative for carcinoma or high-grade dysplasia. No malignant-appearing periportal or peripancreatic lymph nodes.  Procedure details: Informed consent was obtained in the preoperative area prior to the procedure. The patient was brought to the operating room and placed on the table in the supine position. General anesthesia was induced and appropriate lines and drains were placed for intraoperative monitoring. Perioperative antibiotics were administered per SCIP guidelines. The abdomen was prepped and draped in the usual sterile fashion. A pre-procedure timeout was taken verifying patient identity, surgical site and procedure to be performed.  An upper midline skin incision was made, and the  subcutaneous tissue was divided with cautery. The fascia was opened at the linea alba along the full length of the incision. Multiple hernia defects were identified at the inferior aspect of the incision at the site of the patient's previous midline scar. Omental adhesions were taken down off the anterior abdominal wall. The falciform ligament was ligated with the Harmonic, and taken down of the abdominal wall. A Bookwalter retractor was placed. No malignant lesions were palpated in the liver or on the peritoneal lining. The gallbladder was markedly distended. The gastrocolic omentum was divided using Harmonic shears to enter the lesser sac. The duodenum was then widely kocherized. A soft mass was palpable within the duodenum. The gallbladder was taken off the liver in a dome down fashion. The cystic duct was clamped and divided, and the gallbladder was passed off the field and sent for routine pathology.   A longitudinal duodenotomy was then created at the second and third portions on the antimesenteric border using cautery. The duodenum was opened and the adenoma was visualized. It was very soft, well-circumscribed and pedunculated. There was bile draining into the duodenum but the papilla could not be identified within the adenoma. The cystic duct was cannulated with a 4-Fr Fogarty catheter, and multiple attempts were made to pass the catheter into the common bile duct but were unsuccessful due to the angulation of the cystic duct. The cystic duct was further dissected out but the Fogarty was still unable to be passed into the common duct. The adenoma was then taken off the duodenal mucosa using cautery, taking a small 2-8mm margin of normal duodenal mucosa with the specimen. The mass was sent to pathology for frozen analysis, which was negative for malignancy or high grade dysplasia.  The openings of the distal common bile duct and pancreatic duct were both identified within the plane of  resection. Both were  probed and were patent with free flow of bile and pancreatic fluid. The mucosa of each duct was sutured to the surrounding duodenal mucosa using interrupted 5-0 vicryl suture to splay the ducts open. The inner edge of the bile duct was sutured to the inner edge of the adjacent pancreatic duct using interrupted 5-0 vicryl. Each duct was probed at the completion of this and both ducts were widely patent with free flow of bile and pancreatic fluid. The cystic duct stump was closed with a 2-0 silk suture and a clip. The duodenum was closed transversely with an inner layer of 3-0 Vicryl in [place] fashion. An outer layer of 3-0 silk Lembert sutures was placed. The duodenum was palpated and was patent at the site of the closure. Position of the NG tube was confirmed within the stomach. A 19-Fr round blake drain was placed adjacent to the duodenal closure and brought out through the right side of the abdomen.  Lap and instrument counts were correct x2. At this point the procedure was turned over to Dr. Cardell Peach for his portion of the operation. At the completion of his part of the procedure, I returned and confirmed position of the drain next to the duodenum. The fascia was closed with a running 1 PDS. Scarpa's fascia was closed with 3-0 Vicryl and the skin was closed with staples. A sterile dressing was applied.  The patient tolerated the procedure with no apparent complications. Counts were correct x2 at the completion of the procedure.  Sophronia Simas, MD 12/07/20 3:58 PM

## 2020-12-07 NOTE — Transfer of Care (Signed)
Immediate Anesthesia Transfer of Care Note  Patient: Justin Davidson  Procedure(s) Performed: OPEN TRANSDUODENAL AMPULLECTOMY (N/A Abdomen) CHOLECYSTECTOMY (N/A Abdomen) OPEN LEFT RADICAL NEPHRECTOMY (Left Abdomen) LEFT ADRENALECTOMY (Left Abdomen)  Patient Location: PACU  Anesthesia Type:General  Level of Consciousness: drowsy and patient cooperative  Airway & Oxygen Therapy: Patient Spontanous Breathing and Patient connected to face mask oxygen  Post-op Assessment: Report given to RN and Post -op Vital signs reviewed and stable  Post vital signs: Reviewed  Last Vitals:  Vitals Value Taken Time  BP 107/45 12/07/20 1618  Temp    Pulse 82 12/07/20 1628  Resp 21 12/07/20 1628  SpO2 99 % 12/07/20 1628  Vitals shown include unvalidated device data.  Last Pain:  Vitals:   12/07/20 0558  TempSrc: Oral  PainSc:       Patients Stated Pain Goal: 3 (12/07/20 0553)  Complications: No complications documented.

## 2020-12-07 NOTE — H&P (Signed)
Office Visit Report     10/22/2020   --------------------------------------------------------------------------------   Santos Deloza  MRN: [id number]  DOB: 1946/07/25, 74 year old Male  SSN: [id number]   PRIMARY CARE:    REFERRING:  Delrae Rend, MD  PROVIDER:  Jettie Pagan, M.D.  LOCATION:  [facility] [phone]     --------------------------------------------------------------------------------   CC/HPI: Squire Loser is a 74 year old male seen in followup with a left renal mass.   An MRCP was obtained to evaluate for biliary pathology on 09/20/2020 that incidentally revealed a heterogenous, mixed solid and cystic mass at the posterior midportion of the left kidney measuring 3.8 cm. The study was significantly limited by motion artifact, however this appeared to demonstrate some enhancement. As such this is highly suspicious for renal cell carcinoma.   Dedicated renal imaging with CT A/P 10/08/2020 demonstrated 4.6 cm enhancing mass in the left mid lower kidney compatible with RCC. There is no findings of metastatic disease.   The MRCP also demonstrated a small calculus near the ampulla as well as an intraluminal mass of the duodenum near the ampulla, poorly assessed but measuring approximately 4.3 cm. He has met with the general surgeon and is scheduled to undergo resection of this mass.   He also has a history of previous colon cancer s/p resection in Nov 15, 1995, duodenal adenoma s/p incomplete resection. He did undergo a endoscopy earlier this year for a duodenal adenoma. Pathology demonstrated no evidence of high-grade glandular changes, just a duodenal adenoma.   He also has a past medical history significant for hypertension, hyper glycemia, hyperlipidemia, coronary artery disease on Plavix, obstructive sleep apnea, obesity. He weighs 300 pounds.   He denies flank pain or hematuria. He denies significant bothersome lower urinary tract symptoms. He denies a family history of  urologic malignancy.     ALLERGIES: No Allergies    MEDICATIONS: AmLODIPine Besylate TABS Oral  Aspirin TABS Oral  Atorvastatin Calcium 40 MG Oral Tablet Oral  Carvedilol TABS Oral  Clopidogrel Bisulfate 75 MG Oral Tablet Oral  Losartan Potassium TABS Oral  Zetia TABS Oral     GU PSH: Locm 300-399Mg /Ml Iodine,1Ml - 10/08/2020       PSH Notes: Colon cancer s/p resection in 11/14/2005  CAD s/p 2 cardiac stents in Nov 14, 2004   NON-GU PSH: None   GU PMH: Left renal neoplasm - 10/08/2020, - 09/29/2020 ED due to arterial insufficiency, Erectile dysfunction due to arterial insufficiency - 11/14/13 Urinary Frequency, Increased urinary frequency - 2014      PMH Notes:  1898-12-11 00:00:00 - Note: Normal Routine History And Physical Senior Citizen [phone])  2013-01-15 08:15:36 - Note: Colon Cancer   Colon cancer s/p colon resection 1995-11-15  Duodenal mass seen on MRCP 09/2020  CAD on Plavix and 2 cardiac stents in Nov 14, 2014   NON-GU PMH: Personal history of other diseases of the nervous system and sense organs, History of sleep apnea - 2014 Personal history of other endocrine, nutritional and metabolic disease, History of hypercholesterolemia - 11-14-13    FAMILY HISTORY: Death In The Family Father - Runs In Family Death In The Family Mother - Runs In Family No Significant Family History - Runs In Family   SOCIAL HISTORY: None    Notes: Never A Smoker, Caffeine Use, Tobacco Use, Occupation: Retired, Alcohol Use   REVIEW OF SYSTEMS:    GU Review Male:   Patient denies frequent urination, hard to postpone urination, burning/ pain with urination, get up at night to urinate, leakage of urine,  stream starts and stops, trouble starting your stream, have to strain to urinate , erection problems, and penile pain.  Gastrointestinal (Upper):   Patient denies nausea, vomiting, and indigestion/ heartburn.  Gastrointestinal (Lower):   Patient denies diarrhea and constipation.  Constitutional:   Patient denies fever,  night sweats, weight loss, and fatigue.  Skin:   Patient denies skin rash/ lesion and itching.  Eyes:   Patient denies blurred vision and double vision.  Ears/ Nose/ Throat:   Patient denies sore throat and sinus problems.  Hematologic/Lymphatic:   Patient denies swollen glands and easy bruising.  Cardiovascular:   Patient denies leg swelling and chest pains.  Respiratory:   Patient denies cough and shortness of breath.  Endocrine:   Patient denies excessive thirst.  Musculoskeletal:   Patient denies joint pain and back pain.  Neurological:   Patient denies headaches and dizziness.  Psychologic:   Patient denies depression and anxiety.   VITAL SIGNS:      10/22/2020 02:35 PM  Weight 302 lb / 136.98 kg  Height 70.5 in / 179.07 cm  BP 140/87 mmHg  Pulse 53 /min  Temperature 97.1 F / 36.1 C  BMI 42.7 kg/m   MULTI-SYSTEM PHYSICAL EXAMINATION:    Constitutional: Well-nourished. No physical deformities. Normally developed. Good grooming.  Respiratory: No labored breathing, no use of accessory muscles.   Cardiovascular: Normal temperature, normal extremity pulses, no swelling, no varicosities.  Gastrointestinal: No mass, no tenderness, no rigidity, non obese abdomen.     Complexity of Data:  Source Of History:  Patient, Healthcare Provider, Medical Record Summary  Records Review:   AUA Symptom Score, Previous Doctor Records, Previous Hospital Records, IIEF Score  Urine Test Review:   Urinalysis  X-Ray Review: C.T. Abdomen/Pelvis: Reviewed Films. Reviewed Report.     09/10/06  Hormones  Testosterone, Total 3.45    Notes:                     CLINICAL DATA: Further characterization of left renal mass.   EXAM:  CT ABDOMEN AND PELVIS WITHOUT AND WITH CONTRAST   TECHNIQUE:  Multidetector CT imaging of the abdomen and pelvis was performed  following the standard protocol before and following the bolus  administration of intravenous contrast.   CONTRAST: 125 cc Omnipaque 300    COMPARISON: MRI abdomen from 09/20/2020   FINDINGS:  Lower chest: Linear subsegmental atelectasis or scarring  posteromedially in both lower lobes. Coronary atherosclerosis. Mild  mitral valve calcification. Descending thoracic aortic  atherosclerosis.   Hepatobiliary: Intrahepatic biliary dilatation and mild extrahepatic  biliary dilatation. Cholelithiasis is present. I not see a definite  common bile duct stone. As on MRI, the duodenum just distal to the  ampulla seems thickened, and an underlying duodenal mass is not  excluded.   Anteriorly in the lateral segment left hepatic lobe, a 3.2 by 1.2 cm  subcapsular hypodense lesion is present with a 1.3 by 0.8 cm fluid  density component along the right margin, and with the rest of the  lesion hypodense but above fluid density. This could be a complex  cystic lesion, it is difficult to exclude an underlying mass. One  reassurance is that this lesion is not appreciably changed from  2007. Hence it is likely benign.   Pancreas: Unremarkable   Spleen: Unremarkable   Adrenals/Urinary Tract: Both adrenal glands appear normal.   In the left mid to lower kidney, a 4.6 by 3.7 by 3.9 cm enhancing  mass is  present posteriorly on image 72 of series 11, bulging  slightly into the renal pelvis, and also with an exophytic  component. This lesion is clearly enhancing and has an enhancement  pattern and overall appearance most compatible with renal cell  carcinoma. No internal fat content. Small bilateral bladder  diverticula noted. The urinary bladder appears otherwise  unremarkable.   Stomach/Bowel: As noted previously, there is some fullness at the  junction of the descending and transverse duodenum, and a mass in  this vicinity is not readily excluded. Endoscopy is likely warranted  for further characterization if not already performed.   Right hemicolectomy. Descending and sigmoid colon diverticulosis   Vascular/Lymphatic:  Aortoiliac atherosclerotic vascular disease. No  pathologic adenopathy. No tumor thrombus in the left renal vein.   Reproductive: Unremarkable   Other: No supplemental non-categorized findings.   Musculoskeletal: Sclerotic lesions in the left proximal femur, right  iliac bone, and left iliac bone are stable from 09/12/2006 and  considered benign.   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  Small ventral periumbilical and supraumbilical hernias contain  adipose tissue. Chronically stable lucent lesion in the right iliac  bone with thin sclerotic margins common no change from 2007,  considered benign.   IMPRESSION:  1. 4.6 cm enhancing mass posteriorly in the left mid to lower kidney  compatible with renal cell carcinoma. No tumor thrombus in the left  renal vein or adenopathy. No findings of metastatic disease.  2. Intrahepatic biliary dilatation and mild extrahepatic biliary  dilatation. I do not see a definite common bile duct stone on CT. As  on MRI, the duodenum just distal to the ampulla seems thickened, and  an underlying duodenal mass is not excluded. Endoscopy is likely  warranted for further characterization if not already performed.  3. Other imaging findings of potential clinical significance:  Coronary atherosclerosis. Mild mitral valve calcification.  Descending and sigmoid colon diverticulosis. Small ventral  periumbilical and supraumbilical hernias contain adipose tissue.  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  Small bilateral bladder diverticula.  4. Aortic atherosclerosis.   Aortic Atherosclerosis (ICD10-I70.0).    Electronically Signed  By: Gaylyn Rong M.D.  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 New LEFT renal mass diagnosed in 09/20/2020  -Location: posterior left interpole  -Nephrometry score: 8P  -Hgb: 14.1  -ECOG: 0  -Creatinine: 0.82  -Mediastinal LAD: None  -Liver involvement: None   Current Disease status: T1a  Last imaging: CT A/P 10/08/2020  Current Treatment plan: Planning concurrent Left open partial nephrectomy vs left radical nephrectomy with excision of duodenal mass to be performed by Sophronia Simas, general surgery.   Significant comorbidites  as below (obesity >300lbs, CAD s/p 2 cardiac stents and on plavix, HTN, HLD). Would need cardiac clearance prior.   We discussed the fact that 80% of small renal masses are found to be malignant and that the primary treatment option is surgical excision via either open or laparoscopic approach. Other treatment options discussed include ablative therapies (cryoablation v radiofrequency ablation) and active surveillance. At this point in time, the patient has elected to proceed with left open partial nephrectomy.   The patient was provided information regarding their renal mass including the relative risk of benign versus malignant pathology and the natural history of renal cell carcinoma and other possible malignancies of the kidney. The role of renal biopsy, laboratory testing, and imaging studies to further characterize renal masses and/or the presence of metastatic disease were explained. We discussed the role of active surveillance, surgical therapy with both radical nephrectomy and  nephron-sparing surgery, and ablative therapy in the treatment of renal masses. In addition, we discussed our goals of providing an accurate diagnosis and oncologic control while maintaining optimal renal function as appropriate based on the size, location, and complexity of their renal mass as well as their co-morbidities.   We have discussed the risks of treatment in detail including but not limited to bleeding, infection, heart attack, stroke, death, venothromoboembolism, cancer recurrence, injury/damage to surrounding organs and structures, urine leak, the possibility of open surgical conversion for patients undergoing minimally invasive surgery, the risk of developing chronic kidney disease and its associated implications, and the potential risk of end stage renal disease possibly necessitating dialysis.   2. Duodenal mass: MRCP 09/20/2020 revealed a 4.3 cm mass of the duodenum near the ampulla. This appears to been previously biopsied this year with an incomplete resection is demonstrated a duodenal adenoma. He has been evaluated by Sophronia Simas, hepatopancreatobiliary surgeon who is planning excision of this mass.   3. Comorbidities: CAD on plavix, obesity (wt 300lbs), HTN, HLD   CC: Delrae Rend  CC: Sophronia Simas, MD        Next Appointment:      Next Appointment: 10/22/2020 02:30 PM    Appointment Type: Office Visit Established Patient    Location: [facility] [phone]    Provider: Jettie Pagan, M.D.    Reason for Visit: Recheck and ct results      Signed by Jettie Pagan, M.D. on 10/22/20 at 9:22 PM (EST)  Urology Preoperative H&P   Chief Complaint: Left renal mass; duodenal mass  History of Present Illness: Justin Davidson is a 74 y.o. male with a left renal mass and duodenal mass who is here for transduodenal ampullectomy vs Whipple with Dr. Freida Busman and a left open partial vs left radical nephrectomy with me. Denies fevers, chills.   Past Medical History:   Diagnosis Date  . Anxiety   . Colon cancer (HCC)   . Colon polyps   . Coronary artery disease   . Dementia (HCC)   . Dizziness and giddiness   . GERD (gastroesophageal reflux disease)   . Hypercholesteremia   . Hyperglycemia   . Hypertension   . OSA (obstructive sleep apnea)    CPAP   . Prediabetes    diet controlled    Past Surgical History:  Procedure Laterality Date  . BIOPSY  09/13/2020   Procedure: BIOPSY;  Surgeon: Meridee Score Netty Starring., MD;  Location: [facility] ENDOSCOPY;  Service: Gastroenterology;;  . cardaic stents    . CARDIAC CATHETERIZATION  10/28/2010  . COLONOSCOPY    .  ESOPHAGOGASTRODUODENOSCOPY (EGD) WITH PROPOFOL N/A 04/19/2020   Procedure: ESOPHAGOGASTRODUODENOSCOPY (EGD) WITH PROPOFOL;  Surgeon: Vida Rigger, MD;  Location: [facility] ENDOSCOPY;  Service: Endoscopy;  Laterality: N/A;  . ESOPHAGOGASTRODUODENOSCOPY (EGD) WITH PROPOFOL N/A 09/13/2020   Procedure: ESOPHAGOGASTRODUODENOSCOPY (EGD) WITH PROPOFOL;  Surgeon: Meridee Score Netty Starring., MD;  Location: [facility] ENDOSCOPY;  Service: Gastroenterology;  Laterality: N/A;  . EUS  09/13/2020   Procedure: UPPER ENDOSCOPIC ULTRASOUND (EUS) RADIAL;  Surgeon: Lemar Lofty., MD;  Location: [facility] ENDOSCOPY;  Service: Gastroenterology;;  . HOT HEMOSTASIS N/A 04/19/2020   Procedure: HOT HEMOSTASIS (ARGON PLASMA COAGULATION/BICAP);  Surgeon: Vida Rigger, MD;  Location: [facility] ENDOSCOPY;  Service: Endoscopy;  Laterality: N/A;  . PARTIAL COLECTOMY  1996   colon cancer    Allergies:  Allergies  Allergen Reactions  . Lisinopril Cough    Family History  Problem Relation Age of Onset  . Stroke Mother   . Diabetes Mother   . Hypertension Father   . Hyperlipidemia Father   . Seizures Father   . Diabetes Father   . Hypertension Sister   . Hyperlipidemia Sister   . Heart disease Paternal Grandfather   . Diabetes Paternal Grandfather   . GER disease Daughter   . Colon cancer Neg Hx   . Esophageal cancer Neg Hx   . Inflammatory  bowel disease Neg Hx   . Liver disease Neg Hx   . Pancreatic cancer Neg Hx   . Rectal cancer Neg Hx   . Stomach cancer Neg Hx     Social History:  reports that he has never smoked. He has never used smokeless tobacco. He reports current alcohol use. He reports previous drug use.  ROS: A complete review of systems was performed.  All systems are negative except for pertinent findings as noted.  Physical Exam:  Vital signs in last 24 hours: Temp:  [98.5 F (36.9 C)] 98.5 F (36.9 C) (12/28 0558) Pulse Rate:  [64] 64 (12/28 0558) Resp:  [17] 17 (12/28 0558) BP: (158)/(76) 158/76 (12/28 0558) SpO2:  [97 %] 97 % (12/28 0558) Weight:  [914 kg] 139 kg (12/28 0558) Constitutional:  Alert and oriented, No acute distress Cardiovascular: Regular rate and rhythm Respiratory: Normal respiratory effort, Lungs clear bilaterally GI: Abdomen is soft, nontender, nondistended, no abdominal masses GU: No CVA tenderness Lymphatic: No lymphadenopathy Neurologic: Grossly intact, no focal deficits Psychiatric: Normal mood and affect  Laboratory Data:  No results for input(s): WBC, HGB, HCT, PLT in the last 72 hours.  No results for input(s): NA, K, CL, GLUCOSE, BUN, CALCIUM, CREATININE in the last 72 hours.  Invalid input(s): CO3   Results for orders placed or performed during the hospital encounter of 12/07/20 (from the past 24 hour(s))  Prepare RBC (crossmatch)     Status: None   Collection Time: 12/07/20  5:44 AM  Result Value Ref Range   Order Confirmation      ORDER PROCESSED BY BLOOD BANK Performed at [facility], [address], [place], [place] [address]   Type and screen     Status: None (Preliminary result)   Collection Time: 12/07/20  5:55 AM  Result Value Ref Range   ABO/RH(D) PENDING    Antibody Screen PENDING    Sample Expiration      12/10/2020,2359 Performed at [facility], [address], [place], [place] [address]    Recent Results (from the past 240  hour(s))  SARS CORONAVIRUS 2 (TAT 6-24 HRS) Nasopharyngeal Nasopharyngeal Swab  Status: None   Collection Time: 12/06/20  2:04 PM   Specimen: Nasopharyngeal Swab  Result Value Ref Range Status   SARS Coronavirus 2 NEGATIVE NEGATIVE Final    Comment: (NOTE) SARS-CoV-2 target nucleic acids are NOT DETECTED.  The SARS-CoV-2 RNA is generally detectable in upper and lower respiratory specimens during the acute phase of infection. Negative results do not preclude SARS-CoV-2 infection, do not rule out co-infections with other pathogens, and should not be used as the sole basis for treatment or other patient management decisions. Negative results must be combined with clinical observations, patient history, and epidemiological information. The expected result is Negative.  Fact Sheet for Patients: HairSlick.no  Fact Sheet for Healthcare Providers: quierodirigir.com  This test is not yet approved or cleared by the [place] FDA and  has been authorized for detection and/or diagnosis of SARS-CoV-2 by FDA under an Emergency Use Authorization (EUA). This EUA will remain  in effect (meaning this test can be used) for the duration of the COVID-19 declaration under Se ction 564(b)(1) of the Act, 21 U.S.C. section 360bbb-3(b)(1), unless the authorization is terminated or revoked sooner.  Performed at [facility], [address], [place], [place] [address]     Renal Function: Recent Labs    12/01/20 1451  CREATININE 0.83   Estimated Creatinine Clearance: 106.5 mL/min (by C-G formula based on SCr of 0.83 mg/dL).  Radiologic Imaging: No results found.  I independently reviewed the above imaging studies.  Assessment and Plan Jeovanny Kelderman is a 73 y.o. male with a left renal mass and duodenal mass who is here for transduodenal ampullectomy vs Whipple with Dr. Freida Busman and a left open partial vs left radical nephrectomy with  me.  -I discussed with patient and his family regarding the complexity of the mass, it's central location near the hilum. I discussed that a partial nephrectomy would be a riskier operation with a higher chance of complications. We discussed that there is a high chance he will have a left radical nephrectomy. He understands and agrees.   Consented for left open partial nephrectomy, possible left radical nephrectomy.  Matt R. Syria Kestner MD 12/07/2020, 7:20 AM  [facility] Pager: [phone]

## 2020-12-07 NOTE — H&P (Signed)
Justin Davidson 1946-04-29  [id number].    HPI:  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 He has previously undergone 2 attempts at endoscopic resection which were unsuccessful due to the size of the lesion. Biopsies showed a benign adenoma with no dysplasia or carcinoma. On Oct 4 he had an EGD/EUS which showed a 7cm periampullary mass with mild dilation of the common bile duct. The mass extended into the muscularis mucosa, but no malignant lymph nodes were identified. The patient has never had jaundice. He was seen in clinic last month to discuss surgical resection of the mass. He will also be undergoing concurrent left nephrectomy with Dr. Abner Greenspan of urology.   Patient has a history of CAD and was seen by his cardiologist Dr. Einar Gip earlier this month and evaluated as low risk for surgery. His plavix has been discontinued.   ROS: Review of Systems  Constitutional: Negative for chills and fever.  Respiratory: Negative for cough.   Cardiovascular: Negative for chest pain.  Skin: Negative for rash.  Neurological: Negative for focal weakness.    Family History  Problem Relation Age of Onset  . Stroke Mother   . Diabetes Mother   . Hypertension Father   . Hyperlipidemia Father   . Seizures Father   . Diabetes Father   . Hypertension Sister   . Hyperlipidemia Sister   . Heart disease Paternal Grandfather   . Diabetes Paternal Grandfather   . GER disease Daughter   . Colon cancer Neg Hx   . Esophageal cancer Neg Hx   . Inflammatory bowel disease Neg Hx   . Liver disease Neg Hx   . Pancreatic cancer Neg Hx   . Rectal cancer Neg Hx   . Stomach cancer Neg Hx     Past Medical History:  Diagnosis Date  . Anxiety   . Colon cancer ([place])   . Colon polyps   . Coronary artery disease   . Dementia ([place])   . Dizziness and giddiness   . GERD (gastroesophageal reflux disease)   . Hypercholesteremia   . Hyperglycemia   . Hypertension    . OSA (obstructive sleep apnea)    CPAP   . Prediabetes    diet controlled    Past Surgical History:  Procedure Laterality Date  . BIOPSY  09/13/2020   Procedure: BIOPSY;  Surgeon: Rush Landmark Telford Nab., MD;  Location: [facility];  Service: Gastroenterology;;  . cardaic stents    . CARDIAC CATHETERIZATION  10/28/2010  . COLONOSCOPY    . ESOPHAGOGASTRODUODENOSCOPY (EGD) WITH PROPOFOL N/A 04/19/2020   Procedure: ESOPHAGOGASTRODUODENOSCOPY (EGD) WITH PROPOFOL;  Surgeon: Clarene Essex, MD;  Location: [facility];  Service: Endoscopy;  Laterality: N/A;  . ESOPHAGOGASTRODUODENOSCOPY (EGD) WITH PROPOFOL N/A 09/13/2020   Procedure: ESOPHAGOGASTRODUODENOSCOPY (EGD) WITH PROPOFOL;  Surgeon: Rush Landmark Telford Nab., MD;  Location: [facility];  Service: Gastroenterology;  Laterality: N/A;  . EUS  09/13/2020   Procedure: UPPER ENDOSCOPIC ULTRASOUND (EUS) RADIAL;  Surgeon: Irving Copas., MD;  Location: [facility];  Service: Gastroenterology;;  . HOT HEMOSTASIS N/A 04/19/2020   Procedure: HOT HEMOSTASIS (ARGON PLASMA COAGULATION/BICAP);  Surgeon: Clarene Essex, MD;  Location: [facility];  Service: Endoscopy;  Laterality: N/A;  . PARTIAL COLECTOMY  1996   colon cancer    Social History:  reports that he has never smoked. He has never used smokeless tobacco. He reports current alcohol use. He reports previous drug use.  Allergies:  Allergies  Allergen Reactions  . Lisinopril Cough    Medications Prior to Admission  Medication Sig Dispense Refill  . amLODipine (NORVASC) 5 MG tablet Take 5 mg by mouth daily.    Marland Kitchen aspirin EC 81 MG tablet Take 81 mg by mouth daily.    Marland Kitchen atorvastatin (LIPITOR) 80 MG tablet TAKE 1 TABLET BY MOUTH DAILY (Patient taking differently: Take 80 mg by mouth daily.) 90 tablet 3  . clonazePAM (KLONOPIN) 0.5 MG tablet Take 0.5 mg by mouth daily as needed for anxiety.    . docusate sodium (COLACE) 100 MG capsule Take 100 mg by mouth daily.    Marland Kitchen donepezil  (ARICEPT) 23 MG TABS tablet Take 23 mg by mouth daily.     . metoprolol tartrate (LOPRESSOR) 50 MG tablet TAKE 1 TABLET BY MOUTH TWICE DAILY (Patient taking differently: Take 50 mg by mouth 2 (two) times daily.) 180 tablet 3  . omeprazole (PRILOSEC) 40 MG capsule Take 1 capsule (40 mg total) by mouth in the morning and at bedtime. (Patient taking differently: Take 40 mg by mouth daily.) 60 capsule 11  . potassium chloride (MICRO-K) 10 MEQ CR capsule Take 10 mEq by mouth daily.   5  . Psyllium (METAMUCIL FIBER PO) Take 1 Scoop by mouth 2 (two) times daily.     . tamsulosin (FLOMAX) 0.4 MG CAPS capsule Take 0.4 mg by mouth daily after breakfast.     . amLODipine (NORVASC) 10 MG tablet Take 1 tablet (10 mg total) by mouth daily. 90 tablet 3  . clopidogrel (PLAVIX) 75 MG tablet Take 75 mg by mouth daily.       Physical Exam: Blood pressure (!) 158/76, pulse 64, temperature 98.5 F (36.9 C), temperature source Oral, resp. rate 17, height 5\' 10"  (1.778 m), weight (!) 139 kg, SpO2 97 %. General: resting comfortably, appears stated age, no apparent distress Neurological: alert and oriented, no focal deficits, cranial nerves grossly in tact HEENT: normocephalic, atraumatic, oropharynx clear, no scleral icterus Respiratory: normal work of breathing, lungs clear to auscultation bilaterally, symmetric chest wall expansion Abdomen: obese, soft, nontender, well-healed midline surgical scar Extremities: warm and well-perfused, no deformities, moving all extremities spontaneously Psychiatric: normal mood and affect Skin: warm and dry, no jaundice, no rashes or lesions   Results for orders placed or performed during the hospital encounter of 12/07/20 (from the past 48 hour(s))  Prepare RBC (crossmatch)     Status: None   Collection Time: 12/07/20  5:44 AM  Result Value Ref Range   Order Confirmation      ORDER PROCESSED BY BLOOD BANK Performed at [facility], [address], [place], [place]  [place]   Type and screen     Status: None (Preliminary result)   Collection Time: 12/07/20  5:55 AM  Result Value Ref Range   ABO/RH(D) PENDING    Antibody Screen PENDING    Sample Expiration      12/10/2020,2359 Performed at [facility], [address], [place], [place]    No results found.    Assessment/Plan 74 yo male with large periampullary adenoma and left renal mass. Local resection via a transduodenal ampullectomy may be possible, but given the size of the adenoma and associated bile duct dilation there is certainly risk for an underlying dysplasia or malignancy. I discussed extensively with the patient and his daughter the potential need for a Whipple if there is any suspicion for malignancy intraop. They expressed understanding. -  Proceed to OR for transduodenal ampullectomy vs Whipple, with left nephrectomy by Dr. Abner Greenspan - Type and cross for 2 units PRBCs - Plan for ICU admission postop  Michaelle Birks, MD [facility], Hepatobiliary and Pancreatic Surgery 12/07/20 6:47 AM

## 2020-12-07 NOTE — Progress Notes (Signed)
Spoke with Dr. Freida Busman and notified her that patient took 81mg  aspirin this morning and last dose of plavix 75mg  was 12/01/2020.

## 2020-12-07 NOTE — Op Note (Signed)
Operative Note  Preoperative diagnosis:  1.  Left renal mass  Postoperative diagnosis: 1.  Left renal mass  Procedure(s): 1.  Left open radical nephrectomy with left adrenalectomy 2. Intraoperative ultrasound of single retroperitoneal organ with intraoperative interpretation  Surgeon: Jettie Pagan, MD  Assistants:  Harrie Foreman, PA, Dwana Curd, MD  Anesthesia:  General  Complications:  None  EBL:   Specimens: ID Type Source Tests Collected by Time Destination  1 : duodenal adenoma Tissue PATH GI tumor resection SURGICAL PATHOLOGY Fritzi Mandes, MD 12/07/2020 0957   2 : gallbladder Tissue PATH Gallbladder SURGICAL PATHOLOGY Fritzi Mandes, MD 12/07/2020 0913   3 : left kidney and left adrenal gland Tissue PATH GU resection / TURBT / partial nephrectomy SURGICAL PATHOLOGY Jannifer Hick, MD 12/07/2020 1326     Drains/Catheters: 1.  19 Fr round blake drain  Intraoperative findings:   1. Intraoperative ultrasound demonstrated a near 5 cm mass in the left interpolar kidney abutting the renal collecting system as well as the lower pole left renal artery.  Given this finding, we elected to proceed with a radical nephrectomy as I considered that the postoperative complications would be significantly higher with attempting a left partial nephrectomy. 2. Large left kidney to include a significant amount of perirenal fat that was quite tethered particularly in the upper pole and along the splenorenal attachments. 3. The left renal artery and single left renal vein.  The left artery was taken with 3 separate silk ties and a Weck clip.  The left renal vein was taken with a 45 mm vascular stapler load.   4. Excellent hemostasis  Indication:  Justin Davidson is a 74 y.o. male with a left renal mass measuring 4.6 cm on preoperative imaging that was noted to be in the posterior left interpole with a nephrometry score of 8P.  It was found to be abutting the collecting system and a lower pole  left renal artery.  Description of procedure: The indications, alternatives, benefits and risks were discussed with the patient and informed consent was obtained.    Dr. Freida Busman completed her portion of the case which included a cholecystectomy and transduodenal ampullectomy.   The radiographic images were in the room.  Timeout was completed verifying the correct patient, surgical procedure, site and positioning, prior to beginning the procedure. The patient was already positioned supine on the table with a midline incision extending from his upper abdomen to inferior to his umbilicus.  The Bookwalter retractor was appropriately positioned to optimize exposure, using padding on each retractor blade.    The parietal peritoneum was incised on the white line of Toldt and the colon was reflected medially.  Of note, he had a large body habitus and took some effort to appropriately medialize his colon.  Once the colon was reflected, we are able to identify the left kidney which was encased in a significant amount of perirenal fat.  Using sharp and blunt dissection, the retroperitoneal fascia overlying the left renal vessels was separated, exposing the underlying renal vein.  We traced the left gonadal vein superiorly towards the left renal vein.  We also identified the ureter and placed a vessel loop around the left ureter.  The left renal vein was carefully dissected, mobilized, and encircled with a vessel loop. With gentle retraction on the left renal vein, the main left renal artery was identified deep to the left renal vein and dissected for a distance of 2 cm.  It was also encircled  with a vessel loop.   Attention was then directed towards the left renal mass.  We made incision through Gerota's fascia on the anterior surface of the kidney near the lower pole and proceeded to remove some of this thick perirenal fat.  We then used intraoperative ultrasound to evaluate the left renal mass.  It is noted to be  near 5 cm in the interpolar aspect of the kidney abutting the renal collecting system as well as a lower pole left renal artery.  Given this finding, we elected not to proceed with a left open partial nephrectomy and rather proceed with a left radical nephrectomy as we thought that the complications would be higher with a partial nephrectomy.  The gonadal vein was doubly ligated with hemolock clips and divided with the aid of the LigaSure.  We then proceeded to ligate the left renal artery in the following fashion.  We placed a hemolock clip most proximally on the left renal artery.  I then placed 3 separate 0 silk ties on the left renal artery.  We placed an additional clip on the kidney side.  The left renal artery was then cut with no bleeding.  Having interrupted the renal vascular inflow, the left renal vein was similarly ligated.  We used a 45 mm vascular load on the battery-operated stapler to secure the left renal vein.  We then ligated the left ureter by cutting in between 2 separate Hem-o-lok clips.  All vessel loops were removed.  At this point, we proceeded to mobilize the kidney laterally and posteriorly using aid of blunt dissection and LigaSure.  We did identify a plane in between the left adrenal gland and the kidney and began working through this plane however there was a significant amount of perirenal fat which was oozing and we elected to excise this fat as well.  The left upper pole was quite adhered and we had some difficulty mobilizing the left upper pole.  At this point, Dr. Berneice Heinrich assisted in dissecting the left upper pole with the aid of 3 separate loads of the 46mm vascular stapler.  In doing so, we were able to excise the left adrenal gland with the left kidney in the same specimen.  Once the specimen was completely freed, it was removed and sent to pathology for evaluation.  The retroperitoneum was then evaluated and hemostasis was again ensured.  We evaluated the retroperitoneum and  found no injury to the pancreas or spleen.  Prior to closure, the vascular stumps, visceral organs and pleura were inspected and found to be intact.  We did place some Surgicel around the hilum.  The self-retaining retractor was removed.  I then assisted Dr. Freida Busman in closing.  We used 1-0 Prolene for the fascia, 3-0 Vicryl for the Scarpa's fascia and the skin was closed with staples.  A sterile dressing was then applied.  The patient tolerated procedure well with no immediate complications.  At the end of the procedure, all counts were correct.  The patient tolerated procedure well and was taken to the recovery room in satisfactory condition.  Justin R. Trystyn Sitts MD [facility]  Pager: [phone]

## 2020-12-08 ENCOUNTER — Encounter (HOSPITAL_COMMUNITY): Payer: Self-pay | Admitting: Surgery

## 2020-12-08 LAB — COMPREHENSIVE METABOLIC PANEL
ALT: 44 U/L (ref 0–44)
AST: 46 U/L — ABNORMAL HIGH (ref 15–41)
Albumin: 2.8 g/dL — ABNORMAL LOW (ref 3.5–5.0)
Alkaline Phosphatase: 54 U/L (ref 38–126)
Anion gap: 9 (ref 5–15)
BUN: 18 mg/dL (ref 8–23)
CO2: 22 mmol/L (ref 22–32)
Calcium: 8.2 mg/dL — ABNORMAL LOW (ref 8.9–10.3)
Chloride: 108 mmol/L (ref 98–111)
Creatinine, Ser: 1.43 mg/dL — ABNORMAL HIGH (ref 0.61–1.24)
GFR, Estimated: 51 mL/min — ABNORMAL LOW (ref >60)
Glucose, Bld: 136 mg/dL — ABNORMAL HIGH (ref 70–99)
Potassium: 3.6 mmol/L (ref 3.5–5.1)
Sodium: 139 mmol/L (ref 135–145)
Total Bilirubin: 1 mg/dL (ref 0.3–1.2)
Total Protein: 5.1 g/dL — ABNORMAL LOW (ref 6.5–8.1)

## 2020-12-08 LAB — HEMOGLOBIN A1C
Hgb A1c MFr Bld: 5.5 % (ref 4.8–5.6)
Mean Plasma Glucose: 111.15 mg/dL

## 2020-12-08 LAB — CBC
HCT: 35.9 % — ABNORMAL LOW (ref 39.0–52.0)
Hemoglobin: 11.7 g/dL — ABNORMAL LOW (ref 13.0–17.0)
MCH: 28.6 pg (ref 26.0–34.0)
MCHC: 32.6 g/dL (ref 30.0–36.0)
MCV: 87.8 fL (ref 80.0–100.0)
Platelets: 224 10*3/uL (ref 150–400)
RBC: 4.09 MIL/uL — ABNORMAL LOW (ref 4.22–5.81)
RDW: 15.6 % — ABNORMAL HIGH (ref 11.5–15.5)
WBC: 12.9 10*3/uL — ABNORMAL HIGH (ref 4.0–10.5)
nRBC: 0 % (ref 0.0–0.2)

## 2020-12-08 LAB — GLUCOSE, CAPILLARY
Glucose-Capillary: 131 mg/dL — ABNORMAL HIGH (ref 70–99)
Glucose-Capillary: 107 mg/dL — ABNORMAL HIGH (ref 70–99)
Glucose-Capillary: 114 mg/dL — ABNORMAL HIGH (ref 70–99)
Glucose-Capillary: 100 mg/dL — ABNORMAL HIGH (ref 70–99)
Glucose-Capillary: 100 mg/dL — ABNORMAL HIGH (ref 70–99)
Glucose-Capillary: 108 mg/dL — ABNORMAL HIGH (ref 70–99)

## 2020-12-08 LAB — AMYLASE: Amylase: 245 U/L — ABNORMAL HIGH (ref 28–100)

## 2020-12-08 MED ORDER — ENOXAPARIN SODIUM 40 MG/0.4ML ~~LOC~~ SOLN
40.0000 mg | SUBCUTANEOUS | Status: DC
  Administered 2020-12-08 – 2020-12-13 (×6): 40 mg via SUBCUTANEOUS
  Filled 2020-12-08 (×6): qty 0.4

## 2020-12-08 MED ORDER — METOPROLOL TARTRATE 5 MG/5ML IV SOLN
5.0000 mg | Freq: Four times a day (QID) | INTRAVENOUS | Status: DC
  Administered 2020-12-08 – 2020-12-12 (×15): 5 mg via INTRAVENOUS
  Filled 2020-12-08 (×15): qty 5

## 2020-12-08 MED ORDER — HYDRALAZINE HCL 20 MG/ML IJ SOLN
10.0000 mg | INTRAMUSCULAR | Status: DC | PRN
  Administered 2020-12-08: 10 mg via INTRAVENOUS
  Filled 2020-12-08 (×2): qty 1

## 2020-12-08 MED ORDER — LABETALOL HCL 5 MG/ML IV SOLN: 10.0000 mg | Freq: Four times a day (QID) | INTRAVENOUS | Status: DC

## 2020-12-08 NOTE — Evaluation (Signed)
Physical Therapy Evaluation Patient Details Name: Justin Davidson MRN: [id number] DOB: 08-05-1946 Today's Date: 12/08/2020   History of Present Illness  74 yo male with left renal cancer and periampullary duodenal adenoma, s/p radical left nephrectomy, cholecystectomy and transduodenal ampullectomy. PMH consists of CAD, HTN, dementia, and anxiety.    Clinical Impression  Pt admitted with above diagnosis. PTA pt independent with mobility. He lives at home with his wife and reports his daughter will be able to assist as needed. He is able to stay on the main level of his house. On eval, pt required mod assist bed mobility and +2 mod assist transfers. Unable to progress gait this session due to pain and N&V. Pt currently with functional limitations due to the deficits listed below (see PT Problem List). Pt will benefit from skilled PT to increase their independence and safety with mobility to allow discharge to the venue listed below.  Suspect pt will progress well with mobility as pain is more managed. Pt is very motivated.     Follow Up Recommendations Home health PT;Supervision/Assistance - 24 hour    Equipment Recommendations  Other (comment);3in1 (PT) (further assess for possible bariatric rollator)    Recommendations for Other Services       Precautions / Restrictions Precautions Precautions: Fall;Other (comment) Precaution Comments: NG tube, a line, drain      Mobility  Bed Mobility Overal bed mobility: Needs Assistance Bed Mobility: Rolling;Sidelying to Sit Rolling: Mod assist Sidelying to sit: Mod assist;HOB elevated;+2 for safety/equipment       General bed mobility comments: Mildly impulsive during transition to EOB due to pain ('catch in his back') requiring increased assist to ensure R radial a line remained intact.    Transfers Overall transfer level: Needs assistance Equipment used: 2 person hand held assist Transfers: Sit to/from [employer] Sit  to Stand: +2 physical assistance;Mod assist;+2 safety/equipment Stand pivot transfers: Min assist;+2 physical assistance;+2 safety/equipment       General transfer comment: assist to power up and stabilize balance. Pt able to take pivot steps bed to recliner.  Ambulation/Gait                Stairs            Wheelchair Mobility    Modified Rankin (Stroke Patients Only)       Balance Overall balance assessment: Needs assistance Sitting-balance support: No upper extremity supported;Feet supported Sitting balance-Leahy Scale: Fair     Standing balance support: Bilateral upper extremity supported;During functional activity Standing balance-Leahy Scale: Poor Standing balance comment: reliant on external support                             Pertinent Vitals/Pain Pain Assessment: Faces Faces Pain Scale: Hurts whole lot Pain Location: R flank/back Pain Descriptors / Indicators: Grimacing;Guarding;Moaning;Discomfort Pain Intervention(s): Monitored during session;Limited activity within patient's tolerance;Repositioned;PCA encouraged    Home Living Family/patient expects to be discharged to:: Private residence Living Arrangements: Spouse/significant other Available Help at Discharge: Family;Available 24 hours/day (daughter) Type of Home: House Home Access: Stairs to enter   [employer] of Steps: 1 Home Layout: Able to live on main level with bedroom/bathroom Home Equipment: None      Prior Function Level of Independence: Independent         Comments: craftmatic adjustable bed     Hand Dominance        Extremity/Trunk Assessment   Upper Extremity Assessment Upper Extremity Assessment:  Generalized weakness;Overall WFL for tasks assessed    Lower Extremity Assessment Lower Extremity Assessment: Generalized weakness       Communication   Communication: No difficulties  Cognition Arousal/Alertness: Awake/alert Behavior  During Therapy: WFL for tasks assessed/performed Overall Cognitive Status: Within Functional Limits for tasks assessed                                        General Comments General comments (skin integrity, edema, etc.): Pt became nauseated after sitting in recliner. Dry heaving and only spitting out mucous/saliva. Wall suction for NG tube was dettached for transfer. Nausea resolving once NG tube suction re-attached. RN present in room.    Exercises     Assessment/Plan    PT Assessment Patient needs continued PT services  PT Problem List Decreased strength;Decreased mobility;Decreased activity tolerance;Decreased balance;Pain;Decreased knowledge of use of DME       PT Treatment Interventions DME instruction;Therapeutic activities;Gait training;Therapeutic exercise;Patient/family education;Balance training;Stair training;Functional mobility training    PT Goals (Current goals can be found in the Care Plan section)  Acute Rehab PT Goals Patient Stated Goal: home PT Goal Formulation: With patient Time For Goal Achievement: 12/22/20 Potential to Achieve Goals: Good    Frequency Min 3X/week   Barriers to discharge        Co-evaluation               AM-PAC PT "6 Clicks" Mobility  Outcome Measure Help needed turning from your back to your side while in a flat bed without using bedrails?: A Lot Help needed moving from lying on your back to sitting on the side of a flat bed without using bedrails?: A Lot Help needed moving to and from a bed to a chair (including a wheelchair)?: A Lot Help needed standing up from a chair using your arms (e.g., wheelchair or bedside chair)?: A Lot Help needed to walk in hospital room?: A Lot Help needed climbing 3-5 steps with a railing? : Total 6 Click Score: 11    End of Session   Activity Tolerance: Treatment limited secondary to medical complications (Comment);Patient limited by pain (N&V) Patient left: in chair;with  call bell/phone within reach;with nursing/sitter in room Nurse Communication: Mobility status PT Visit Diagnosis: Muscle weakness (generalized) (M62.81);Pain;Difficulty in walking, not elsewhere classified (R26.2)    Time: 3419-6222 PT Time Calculation (min) (ACUTE ONLY): 24 min   Charges:   PT Evaluation $PT Eval Moderate Complexity: 1 Mod PT Treatments $Therapeutic Activity: 8-22 mins        Aida Raider, PT  Office # [phone] Pager [phone]   Ilda Foil 12/08/2020, 1:35 PM

## 2020-12-08 NOTE — Progress Notes (Addendum)
1 Day Post-Op Subjective: No issues overnight. Pain controlled. No nausea. Tolerating NGT. No flatus. Tolerating foley.  Objective: Vital signs in last 24 hours: Temp:  [97.3 F (36.3 C)-98.4 F (36.9 C)] 98.4 F (36.9 C) (12/29 0400) Pulse Rate:  [74-97] 78 (12/29 0600) Resp:  [19-31] 19 (12/29 0600) BP: (105-151)/(43-93) 131/62 (12/29 0600) SpO2:  [91 %-96 %] 93 % (12/29 0600) Arterial Line BP: (107-187)/(35-72) 160/58 (12/29 0600)  Intake/Output from previous day: 12/28 0701 - 12/29 0700 In: 7073.1 [I.V.:5758.1; Blood:315; IV Piggyback:1000] Out: 3240 [Urine:1500; Emesis/NG output:160; Drains:330; Blood:1250] Intake/Output this shift: No intake/output data recorded.   UOP: 1.5L clear NGT: gastric JP: ss  Physical Exam:  General: Alert and oriented CV: RRR Lungs: Clear Abdomen: Soft, ND, ATTP; dressing in place; JP ss GU: foley clear Ext: NT, No erythema  Lab Results: Recent Labs    12/07/20 1539 12/07/20 1615 12/08/20 0456  HGB 11.2* 11.5* 11.7*  HCT 33.0* 37.5* 35.9*   BMET Recent Labs    12/07/20 1539 12/07/20 1615  NA 141 138  K 3.5 3.4*  CL  --  104  CO2  --  16*  GLUCOSE  --  174*  BUN  --  15  CREATININE  --  1.28*  CALCIUM  --  8.3*     Studies/Results: No results found.  Assessment/Plan: 1. 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 Duodenal adenoma s/p transduodenal ampullectomy with cholecystectomy by Dr. Freida Busman 12/07/2020  -Pain control with dilaudid PCA. Transition to PO once tolerating diet. -Diet per surgery - NPO/NGT -MIVF -Void trial. Check bladder scans. -Hgb appropriate at 11.7 -F/u AM cr. Post-op cr with expected rise 1.28. Great UOP. -OOB, amb, IS   LOS: 1 day   Matt R. Sharmaine Bain MD 12/08/2020, 7:22 AM [facility]  Pager: [phone]

## 2020-12-08 NOTE — Progress Notes (Signed)
    1 Day Post-Op  Subjective: No acute issues overnight. Patient has been hemodynamically stable. Good UOP. Hgb stable, CMP pending this morning. Patient reports his pain is well-controlled.  Objective: Vital signs in last 24 hours: Temp:  [97.3 F (36.3 C)-98.4 F (36.9 C)] 98.4 F (36.9 C) (12/29 0400) Pulse Rate:  [74-97] 78 (12/29 0600) Resp:  [19-31] 19 (12/29 0600) BP: (105-151)/(43-93) 131/62 (12/29 0600) SpO2:  [91 %-96 %] 93 % (12/29 0600) Arterial Line BP: (107-187)/(35-72) 160/58 (12/29 0600) Last BM Date:  (pta)  Intake/Output from previous day: 12/28 0701 - 12/29 0700 In: 7073.1 [I.V.:5758.1; Blood:315; IV Piggyback:1000] Out: 3240 [Urine:1500; Emesis/NG output:160; Drains:330; Blood:1250] Intake/Output this shift: No intake/output data recorded.  PE: General: resting comfortably, NAD Neuro: alert and oriented HEENT: NG in place with bilious drainage CV: RRR Abdomen: soft, nontender, clean dressing in place over midline incision. RUQ JP with serosanguinous drainage. GU: foley in place draining clear yellow urine  Lab Results:  Recent Labs    12/07/20 1615 12/08/20 0456  WBC 13.0* 12.9*  HGB 11.5* 11.7*  HCT 37.5* 35.9*  PLT 229 224   BMET Recent Labs    12/07/20 1539 12/07/20 1615  NA 141 138  K 3.5 3.4*  CL  --  104  CO2  --  16*  GLUCOSE  --  174*  BUN  --  15  CREATININE  --  1.28*  CALCIUM  --  8.3*   PT/INR No results for input(s): LABPROT, INR in the last 72 hours. CMP     Component Value Date/Time   NA 138 12/07/2020 1615   K 3.4 (L) 12/07/2020 1615   CL 104 12/07/2020 1615   CO2 16 (L) 12/07/2020 1615   GLUCOSE 174 (H) 12/07/2020 1615   BUN 15 12/07/2020 1615   CREATININE 1.28 (H) 12/07/2020 1615   CALCIUM 8.3 (L) 12/07/2020 1615   PROT 5.2 (L) 12/07/2020 1615   ALBUMIN 2.9 (L) 12/07/2020 1615   AST 70 (H) 12/07/2020 1615   ALT 57 (H) 12/07/2020 1615   ALKPHOS 61 12/07/2020 1615   BILITOT 0.9 12/07/2020 1615   GFRNONAA  58 (L) 12/07/2020 1615   GFRAA >60 09/13/2020 1350   Lipase  No results found for: LIPASE    Assessment/Plan 74 yo male with left renal cancer and periampullary duodenal adenoma, POD1 s/p radical left nephrectomy, cholecystectomy and transduodenal ampullectomy. - Keep NG tube in place today to LIS, strict NPO - Maintenance IV fluids - Remove foley, I&O cath prn - Out of bed, PT ordered - VTE: lovenox, SCDs - Dispo: ICU   LOS: 1 day    Sophronia Simas, MD [facility] Surgery General, Hepatobiliary and Pancreatic Surgery 12/08/20 7:10 AM

## 2020-12-09 LAB — CBC
HCT: 31.6 % — ABNORMAL LOW (ref 39.0–52.0)
Hemoglobin: 10.5 g/dL — ABNORMAL LOW (ref 13.0–17.0)
MCH: 29 pg (ref 26.0–34.0)
MCHC: 33.2 g/dL (ref 30.0–36.0)
MCV: 87.3 fL (ref 80.0–100.0)
Platelets: 207 10*3/uL (ref 150–400)
RBC: 3.62 MIL/uL — ABNORMAL LOW (ref 4.22–5.81)
RDW: 15.7 % — ABNORMAL HIGH (ref 11.5–15.5)
WBC: 15.2 10*3/uL — ABNORMAL HIGH (ref 4.0–10.5)
nRBC: 0 % (ref 0.0–0.2)

## 2020-12-09 LAB — COMPREHENSIVE METABOLIC PANEL
ALT: 29 U/L (ref 0–44)
AST: 32 U/L (ref 15–41)
Albumin: 2.5 g/dL — ABNORMAL LOW (ref 3.5–5.0)
Alkaline Phosphatase: 53 U/L (ref 38–126)
Anion gap: 10 (ref 5–15)
BUN: 20 mg/dL (ref 8–23)
CO2: 24 mmol/L (ref 22–32)
Calcium: 8.2 mg/dL — ABNORMAL LOW (ref 8.9–10.3)
Chloride: 105 mmol/L (ref 98–111)
Creatinine, Ser: 1.6 mg/dL — ABNORMAL HIGH (ref 0.61–1.24)
GFR, Estimated: 44 mL/min — ABNORMAL LOW (ref >60)
Glucose, Bld: 118 mg/dL — ABNORMAL HIGH (ref 70–99)
Potassium: 3.8 mmol/L (ref 3.5–5.1)
Sodium: 139 mmol/L (ref 135–145)
Total Bilirubin: 1.3 mg/dL — ABNORMAL HIGH (ref 0.3–1.2)
Total Protein: 5.2 g/dL — ABNORMAL LOW (ref 6.5–8.1)

## 2020-12-09 LAB — GLUCOSE, CAPILLARY
Glucose-Capillary: 116 mg/dL — ABNORMAL HIGH (ref 70–99)
Glucose-Capillary: 102 mg/dL — ABNORMAL HIGH (ref 70–99)
Glucose-Capillary: 100 mg/dL — ABNORMAL HIGH (ref 70–99)
Glucose-Capillary: 96 mg/dL (ref 70–99)
Glucose-Capillary: 97 mg/dL (ref 70–99)

## 2020-12-09 LAB — AMYLASE: Amylase: 95 U/L (ref 28–100)

## 2020-12-09 MED ORDER — ORAL CARE MOUTH RINSE
15.0000 mL | Freq: Two times a day (BID) | OROMUCOSAL | Status: DC
  Administered 2020-12-09 – 2020-12-11 (×5): 15 mL via OROMUCOSAL

## 2020-12-09 MED ORDER — CHLORHEXIDINE GLUCONATE 0.12 % MT SOLN
15.0000 mL | Freq: Two times a day (BID) | OROMUCOSAL | Status: DC
  Administered 2020-12-09 – 2020-12-11 (×4): 15 mL via OROMUCOSAL
  Filled 2020-12-09 (×5): qty 15

## 2020-12-09 NOTE — Progress Notes (Signed)
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 PMH consists of CAD, HTN, dementia, and anxiety.    PT Comments    The pt was able to progress with PT goals and mobility at this time, completing bed mobility and OOB transfers with minA only and single UE support. The pt was also able to progress to complete a short bout of ambulation in the room and x3 sit-stand transfers from recliner with  MinG for safety. The pt remains limited by power, endurance, dynamic stability, and activity tolerance. He reports no use of O2 at home prior to admission, but required 3L O2 to maintain SpO2 in 90s with short bouts of activity during today's session. The pt will continue to benefit from skilled PT to progress functional endurance, stability, and assess for need of O2 or bariatric rollator to allow for safe mobility following anticipated d/c home.     Follow Up Recommendations  Home health PT;Supervision/Assistance - 24 hour     Equipment Recommendations  3in1 (PT) (further assess for bariatric rollator with longer gait)    Recommendations for Other Services       Precautions / Restrictions Precautions Precautions: Fall;Other (comment) Precaution Comments: NG tube, a line, drain Restrictions Weight Bearing Restrictions: No    Mobility  Bed Mobility Overal bed mobility: Needs Assistance Bed Mobility: Rolling;Sidelying to Sit Rolling: Min assist Sidelying to sit: Min assist;HOB elevated       General bed mobility comments: mild impulsivity, minA given to raise trunk from elevated HOB. pt with increased effort and time to complete  Transfers Overall transfer level: Needs assistance Equipment used: 1 person hand held assist Transfers: Sit  to/from [employer] Sit to Stand: Min assist Stand pivot transfers: Min assist       General transfer comment: minA to power up and steady in standing. pt took steps to recliner without UE support from PT, reaching for armrest of recliner  Ambulation/Gait Ambulation/Gait assistance: Min assist Gait Distance (Feet): 5 Feet Assistive device: None Gait Pattern/deviations: Step-to pattern;Decreased stride length Gait velocity: decreasee Gait velocity interpretation: <1.31 ft/sec, indicative of household ambulator General Gait Details: limited by O2 tubing, pt able to take steps to recliner, then forwards and backwards from recliner without assist. small steps with wide BOS and increased lateral movement      Balance Overall balance assessment: Needs assistance Sitting-balance support: No upper extremity supported;Feet supported Sitting balance-Leahy Scale: Fair     Standing balance support: No upper extremity supported Standing balance-Leahy Scale: Fair Standing balance comment: able to take small, unstable steps without LOB or support                            Cognition Arousal/Alertness: Awake/alert Behavior During Therapy: WFL for tasks assessed/performed Overall Cognitive Status: Impaired/Different from baseline Area of Impairment: Safety/judgement                         Safety/Judgement: Decreased awareness of safety;Decreased awareness of deficits     General Comments: Pt with generally functional cog, but decreased insight to deficits and importance of following plan of care regarding NPO. pt asking PT for cup of water multiple times through session despite repeated education  General Comments General comments (skin integrity, edema, etc.): VSS on RA, pt asking for water repeatedly      Pertinent Vitals/Pain Pain Assessment: Faces Faces Pain Scale: Hurts a little bit Pain Location: R flank/back Pain Descriptors /  Indicators: Grimacing;Guarding;Moaning;Discomfort Pain Intervention(s): Monitored during session;Limited activity within patient's tolerance;Repositioned           PT Goals (current goals can now be found in the care plan section) Acute Rehab PT Goals Patient Stated Goal: home PT Goal Formulation: With patient Time For Goal Achievement: 12/22/20 Potential to Achieve Goals: Good Progress towards PT goals: Progressing toward goals    Frequency    Min 3X/week      PT Plan Current plan remains appropriate       AM-PAC PT "6 Clicks" Mobility   Outcome Measure  Help needed turning from your back to your side while in a flat bed without using bedrails?: A Little Help needed moving from lying on your back to sitting on the side of a flat bed without using bedrails?: A Little Help needed moving to and from a bed to a chair (including a wheelchair)?: A Little Help needed standing up from a chair using your arms (e.g., wheelchair or bedside chair)?: A Little Help needed to walk in hospital room?: A Little Help needed climbing 3-5 steps with a railing? : A Lot 6 Click Score: 17    End of Session Equipment Utilized During Treatment: Oxygen Activity Tolerance: Patient limited by fatigue Patient left: in chair;with call bell/phone within reach;with chair alarm set Nurse Communication: Mobility status PT Visit Diagnosis: Muscle weakness (generalized) (M62.81);Pain;Difficulty in walking, not elsewhere classified (R26.2)     Time: [id number] PT Time Calculation (min) (ACUTE ONLY): 21 min  Charges:  $Therapeutic Activity: 8-22 mins                     Karma Ganja, PT, DPT   Acute Rehabilitation Department Pager #: [phone]   Otho Bellows 12/09/2020, 5:53 PM

## 2020-12-09 NOTE — Anesthesia Postprocedure Evaluation (Signed)
Anesthesia Post Note  Patient: Cole Eastridge  Procedure(s) Performed: OPEN TRANSDUODENAL AMPULLECTOMY (N/A Abdomen) CHOLECYSTECTOMY (N/A Abdomen) OPEN LEFT RADICAL NEPHRECTOMY (Left Abdomen) LEFT ADRENALECTOMY (Left Abdomen)     Patient location during evaluation: PACU Anesthesia Type: General Level of consciousness: sedated and patient cooperative Pain management: pain level controlled Vital Signs Assessment: post-procedure vital signs reviewed and stable Respiratory status: spontaneous breathing Cardiovascular status: stable Anesthetic complications: no   No complications documented.  Last Vitals:  Vitals:   12/09/20 0802 12/09/20 0900  BP:  (!) 154/62  Pulse:  79  Resp: 20 19  Temp:    SpO2: 94% 93%    Last Pain:  Vitals:   12/09/20 0802  TempSrc:   PainSc: 3                  Lewie Loron

## 2020-12-09 NOTE — Progress Notes (Signed)
Patient received from 4N. - Pt. On PCA pump and Iv fluids infusing. - NG tube in place and hooked to Low Intermittent Suction. - Tele box on. - Patient has no complaints of pain at the moment and no signs of distress. Patient resting comfortably in bed.

## 2020-12-09 NOTE — Progress Notes (Signed)
    2 Days Post-Op  Subjective: Worked with PT yesterday. NG output is high and bilious and patient has had some nausea. Serum amylase downtrending. Creatinine trending up slightly. Patient is voiding spontaneously.   Objective: Vital signs in last 24 hours: Temp:  [98 F (36.7 C)-99 F (37.2 C)] 98.5 F (36.9 C) (12/30 0400) Pulse Rate:  [74-101] 85 (12/30 0800) Resp:  [14-28] 20 (12/30 0802) BP: (102-164)/(58-137) 154/62 (12/30 0800) SpO2:  [90 %-97 %] 94 % (12/30 0802) Arterial Line BP: (147-226)/(54-88) 180/64 (12/30 0800) Last BM Date:  (PTA)  Intake/Output from previous day: 12/29 0701 - 12/30 0700 In: 1894.1 [I.V.:1894.1] Out: 1995 [Urine:825; Emesis/NG output:800; Drains:370] Intake/Output this shift: Total I/O In: 84 [I.V.:84] Out: -   PE: General: resting comfortably, NAD Neuro: alert and oriented, no focal deficits HEENT: NG in place with bilious output Resp: normal work of breathing on room air CV: RRR Abdomen: soft, nontender, clean dry dressing in place over midline incision. JP with serosanguinous drainage. Extremities: warm and well-perfused   Lab Results:  Recent Labs    12/08/20 0456 12/09/20 0308  WBC 12.9* 15.2*  HGB 11.7* 10.5*  HCT 35.9* 31.6*  PLT 224 207   BMET Recent Labs    12/08/20 0456 12/09/20 0308  NA 139 139  K 3.6 3.8  CL 108 105  CO2 22 24  GLUCOSE 136* 118*  BUN 18 20  CREATININE 1.43* 1.60*  CALCIUM 8.2* 8.2*   PT/INR No results for input(s): LABPROT, INR in the last 72 hours. CMP     Component Value Date/Time   NA 139 12/09/2020 0308   K 3.8 12/09/2020 0308   CL 105 12/09/2020 0308   CO2 24 12/09/2020 0308   GLUCOSE 118 (H) 12/09/2020 0308   BUN 20 12/09/2020 0308   CREATININE 1.60 (H) 12/09/2020 0308   CALCIUM 8.2 (L) 12/09/2020 0308   PROT 5.2 (L) 12/09/2020 0308   ALBUMIN 2.5 (L) 12/09/2020 0308   AST 32 12/09/2020 0308   ALT 29 12/09/2020 0308   ALKPHOS 53 12/09/2020 0308   BILITOT 1.3 (H)  12/09/2020 0308   GFRNONAA 44 (L) 12/09/2020 0308   GFRAA >60 09/13/2020 1350   Lipase  No results found for: LIPASE     Studies/Results: No results found.    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 - Continue trending LFTs and creatinine - Keep NG tube to suction given ongoing high volume bilious output in setting of duodenal repair.  - Ok for patient to have a small amount of ice chips, otherwise strict NPO - Ambulate, PT following - VTE: lovenox, SCDs - Dispo: transfer to floor. Remove a-line.    LOS: 2 days    Sophronia Simas, MD [facility] Surgery General, Hepatobiliary and Pancreatic Surgery 12/09/20 8:55 AM

## 2020-12-09 NOTE — Progress Notes (Signed)
Urology Inpatient Progress Report  Procedure(s): OPEN TRANSDUODENAL AMPULLECTOMY OPEN LEFT RADICAL NEPHRECTOMY CHOLECYSTECTOMY LEFT ADRENALECTOMY  2 Days Post-Op   Intv/Subj: No acute events overnight.  Patient's catheter has been removed and he is voiding spontaneously.  Active Problems:   Duodenal adenoma   Ampullary adenoma  Current Facility-Administered Medications  Medication Dose Route Frequency Provider Last Rate Last Admin  . Chlorhexidine Gluconate Cloth 2 % PADS 6 each  6 each Topical Daily Fritzi Mandes, MD   6 each at 12/08/20 1250  . diphenhydrAMINE (BENADRYL) injection 12.5 mg  12.5 mg Intravenous Q6H PRN Fritzi Mandes, MD       Or  . diphenhydrAMINE (BENADRYL) 12.5 MG/5ML elixir 12.5 mg  12.5 mg Oral Q6H PRN Sophronia Simas L, MD      . enoxaparin (LOVENOX) injection 40 mg  40 mg Subcutaneous Q24H Sophronia Simas L, MD   40 mg at 12/08/20 1106  . hydrALAZINE (APRESOLINE) injection 10 mg  10 mg Intravenous Q4H PRN Fritzi Mandes, MD   10 mg at 12/08/20 1507  . HYDROmorphone (DILAUDID) 1 mg/mL PCA injection   Intravenous Q4H Fritzi Mandes, MD   30 mg at 12/07/20 1656  . insulin aspart (novoLOG) injection 0-15 Units  0-15 Units Subcutaneous Q4H Fritzi Mandes, MD   2 Units at 12/08/20 0451  . lactated ringers infusion   Intravenous Continuous Fritzi Mandes, MD 84 mL/hr at 12/09/20 0700 Infusion Verify at 12/09/20 0700  . metoprolol tartrate (LOPRESSOR) injection 5 mg  5 mg Intravenous Q6H Sophronia Simas L, MD   5 mg at 12/09/20 0525  . naloxone [facility]) injection 0.4 mg  0.4 mg Intravenous PRN Fritzi Mandes, MD       And  . sodium chloride flush (NS) 0.9 % injection 9 mL  9 mL Intravenous PRN Fritzi Mandes, MD      . ondansetron [facility]) injection 4 mg  4 mg Intravenous Q6H PRN Fritzi Mandes, MD   4 mg at 12/08/20 1106  . pantoprazole (PROTONIX) injection 40 mg  40 mg Intravenous Daily Fritzi Mandes, MD   40 mg at 12/08/20 0915      Objective: Vital: Vitals:   12/09/20 0500 12/09/20 0525 12/09/20 0600 12/09/20 0700  BP: 132/60  (!) 146/67 (!) 147/62  Pulse: 87  79 82  Resp: 18 20 17  (!) 22  Temp:      TempSrc:      SpO2: 93% 92% 92% 93%  Weight:      Height:       I/Os: I/O last 3 completed shifts: In: 2344.1 [I.V.:2344.1] Out: 2915 [Urine:1425; Emesis/NG output:950; Drains:540]  Physical Exam:  General: Patient is in no apparent distress Lungs: Normal respiratory effort, chest expands symmetrically. GI: NGT in place, Incision dressing c/d/i over midline incision. The abdomen is soft, mildly ttp, JP ss output Foley: Has been removed Ext: lower extremities symmetric  Lab Results: Recent Labs    12/07/20 1615 12/08/20 0456 12/09/20 0308  WBC 13.0* 12.9* 15.2*  HGB 11.5* 11.7* 10.5*  HCT 37.5* 35.9* 31.6*   Recent Labs    12/07/20 1615 12/08/20 0456 12/09/20 0308  NA 138 139 139  K 3.4* 3.6 3.8  CL 104 108 105  CO2 16* 22 24  GLUCOSE 174* 136* 118*  BUN 15 18 20   CREATININE 1.28* 1.43* 1.60*  CALCIUM 8.3* 8.2* 8.2*   No results for input(s): LABPT, INR in the last 72 hours. No results for  input(s): LABURIN in the last 72 hours. Results for orders placed or performed during the hospital encounter of 12/06/20  SARS CORONAVIRUS 2 (TAT 6-24 HRS) Nasopharyngeal Nasopharyngeal Swab     Status: None   Collection Time: 12/06/20  2:04 PM   Specimen: Nasopharyngeal Swab  Result Value Ref Range Status   SARS Coronavirus 2 NEGATIVE NEGATIVE Final    Comment: (NOTE) SARS-CoV-2 target nucleic acids are NOT DETECTED.  The SARS-CoV-2 RNA is generally detectable in upper and lower respiratory specimens during the acute phase of infection. Negative results do not preclude SARS-CoV-2 infection, do not rule out co-infections with other pathogens, and should not be used as the sole basis for treatment or other patient management decisions. Negative results must be combined with clinical  observations, patient history, and epidemiological information. The expected result is Negative.  Fact Sheet for Patients: SugarRoll.be  Fact Sheet for Healthcare Providers: https://www.woods-mathews.com/  This test is not yet approved or cleared by the [place] FDA and  has been authorized for detection and/or diagnosis of SARS-CoV-2 by FDA under an Emergency Use Authorization (EUA). This EUA will remain  in effect (meaning this test can be used) for the duration of the COVID-19 declaration under Se ction 564(b)(1) of the Act, 21 U.S.C. section 360bbb-3(b)(1), unless the authorization is terminated or revoked sooner.  Performed at [facility], [place] [address], [place], [place] [address]     Studies/Results: No results found.  Assessment: 1. 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 Duodenal adenoma s/p transduodenal ampullectomy with cholecystectomy by Dr. Zenia Resides 12/07/2020  -Pain control with dilaudid PCA. Transition to PO once tolerating diet. -Diet per surgery - NPO/NGT -MIVF -Hgb appropriate at 11.7 -F/u AM cr. Post-op cr with expected rise 1.6 today - has not yet peaked. -OOB, amb, IS   LOS: 2 days  Jacalyn Lefevre, MD Urology 12/09/2020, 7:20 AM

## 2020-12-10 ENCOUNTER — Inpatient Hospital Stay (HOSPITAL_COMMUNITY): Payer: Medicare Other

## 2020-12-10 LAB — GLUCOSE, CAPILLARY
Glucose-Capillary: 75 mg/dL (ref 70–99)
Glucose-Capillary: 80 mg/dL (ref 70–99)
Glucose-Capillary: 87 mg/dL (ref 70–99)
Glucose-Capillary: 87 mg/dL (ref 70–99)
Glucose-Capillary: 94 mg/dL (ref 70–99)
Glucose-Capillary: 99 mg/dL (ref 70–99)

## 2020-12-10 LAB — CBC
HCT: 32.1 % — ABNORMAL LOW (ref 39.0–52.0)
Hemoglobin: 10.7 g/dL — ABNORMAL LOW (ref 13.0–17.0)
MCH: 28.8 pg (ref 26.0–34.0)
MCHC: 33.3 g/dL (ref 30.0–36.0)
MCV: 86.5 fL (ref 80.0–100.0)
Platelets: 211 10*3/uL (ref 150–400)
RBC: 3.71 MIL/uL — ABNORMAL LOW (ref 4.22–5.81)
RDW: 15.5 % (ref 11.5–15.5)
WBC: 14.9 10*3/uL — ABNORMAL HIGH (ref 4.0–10.5)
nRBC: 0 % (ref 0.0–0.2)

## 2020-12-10 LAB — COMPREHENSIVE METABOLIC PANEL
ALT: 28 U/L (ref 0–44)
AST: 26 U/L (ref 15–41)
Albumin: 2.4 g/dL — ABNORMAL LOW (ref 3.5–5.0)
Alkaline Phosphatase: 65 U/L (ref 38–126)
Anion gap: 9 (ref 5–15)
BUN: 21 mg/dL (ref 8–23)
CO2: 27 mmol/L (ref 22–32)
Calcium: 8.3 mg/dL — ABNORMAL LOW (ref 8.9–10.3)
Chloride: 106 mmol/L (ref 98–111)
Creatinine, Ser: 1.54 mg/dL — ABNORMAL HIGH (ref 0.61–1.24)
GFR, Estimated: 46 mL/min — ABNORMAL LOW (ref >60)
Glucose, Bld: 110 mg/dL — ABNORMAL HIGH (ref 70–99)
Potassium: 3.2 mmol/L — ABNORMAL LOW (ref 3.5–5.1)
Sodium: 142 mmol/L (ref 135–145)
Total Bilirubin: 1.1 mg/dL (ref 0.3–1.2)
Total Protein: 5.5 g/dL — ABNORMAL LOW (ref 6.5–8.1)

## 2020-12-10 MED ORDER — POTASSIUM CHLORIDE 10 MEQ/100ML IV SOLN
10.0000 meq | INTRAVENOUS | Status: AC
  Administered 2020-12-10 (×4): 10 meq via INTRAVENOUS
  Filled 2020-12-10 (×4): qty 100

## 2020-12-10 NOTE — Progress Notes (Signed)
Patient ambulated in the hall approximately 350ft with this writer and tolerated well.

## 2020-12-10 NOTE — Progress Notes (Signed)
3 Days Post-Op  Subjective: Transferred to floor yesterday. Working with PT. Creatinine peaked and is downtrending today. Bilirubin normal. Good UOP. NG output>2L and bilious. Abdominal XR this morning shows tube is in the stomach. Pain controlled. Patient reports he is passing flatus this morning. Asking to drink more liquids.   Objective: Vital signs in last 24 hours: Temp:  [98.8 F (37.1 C)-99.1 F (37.3 C)] 98.8 F (37.1 C) (12/31 0411) Pulse Rate:  [71-89] 86 (12/31 0411) Resp:  [17-24] 20 (12/31 0413) BP: (117-173)/(56-121) 162/65 (12/31 0411) SpO2:  [91 %-98 %] 95 % (12/31 0413) Arterial Line BP: (161)/(57) 161/57 (12/30 0900) Last BM Date:  (PTA)  Intake/Output from previous day: 12/30 0701 - 12/31 0700 In: 2149 [P.O.:270; I.V.:1879] Out: 3980 [Urine:1175; Emesis/NG output:2650; Drains:155] Intake/Output this shift: No intake/output data recorded.  PE: General: resting comfortably, NAD Neuro: alert and oriented, no focal deficits HEENT: NG with bilious output Resp: normal work of breathing on nasal cannula Abdomen: soft, nondistended, nontender to palpation. Midline incision c/d/i with staples in place. JP with serosanguinous drainage Extremities: warm and well-perfused   Lab Results:  Recent Labs    12/09/20 0308 12/10/20 0138  WBC 15.2* 14.9*  HGB 10.5* 10.7*  HCT 31.6* 32.1*  PLT 207 211   BMET Recent Labs    12/09/20 0308 12/10/20 0138  NA 139 142  K 3.8 3.2*  CL 105 106  CO2 24 27  GLUCOSE 118* 110*  BUN 20 21  CREATININE 1.60* 1.54*  CALCIUM 8.2* 8.3*   PT/INR No results for input(s): LABPROT, INR in the last 72 hours. CMP     Component Value Date/Time   NA 142 12/10/2020 0138   K 3.2 (L) 12/10/2020 0138   CL 106 12/10/2020 0138   CO2 27 12/10/2020 0138   GLUCOSE 110 (H) 12/10/2020 0138   BUN 21 12/10/2020 0138   CREATININE 1.54 (H) 12/10/2020 0138   CALCIUM 8.3 (L) 12/10/2020 0138   PROT 5.5 (L) 12/10/2020 0138   ALBUMIN  2.4 (L) 12/10/2020 0138   AST 26 12/10/2020 0138   ALT 28 12/10/2020 0138   ALKPHOS 65 12/10/2020 0138   BILITOT 1.1 12/10/2020 0138   GFRNONAA 46 (L) 12/10/2020 0138   GFRAA >60 09/13/2020 1350   Lipase  No results found for: LIPASE     Studies/Results: DG Abd 1 View  Result Date: 12/10/2020 CLINICAL DATA:  NG tube, nausea, vomiting EXAM: ABDOMEN - 1 VIEW COMPARISON:  10/08/2020 CT FINDINGS: NG tube is in the stomach. Dilated small bowel loops most notable in the right abdomen compatible with small bowel obstruction. IMPRESSION: NG tube tip in the stomach. Small bowel obstruction pattern. Electronically Signed   By: Charlett Nose M.D.   On: 12/10/2020 08:02    Anti-infectives: Anti-infectives (From admission, onward)   Start     Dose/Rate Route Frequency Ordered Stop   12/07/20 0600  ceFAZolin (ANCEF) 3 g in dextrose 5 % 50 mL IVPB  Status:  Discontinued        3 g 100 mL/hr over 30 Minutes Intravenous  Once 12/06/20 0641 12/07/20 0547   12/07/20 0600  cefTRIAXone (ROCEPHIN) 2 g in sodium chloride 0.9 % 100 mL IVPB  Status:  Discontinued       "And" Linked Group Details   2 g 200 mL/hr over 30 Minutes Intravenous On call to O.R. 12/07/20 0543 12/07/20 1725   12/07/20 0600  metroNIDAZOLE (FLAGYL) IVPB 500 mg       "  And" Linked Group Details   500 mg 100 mL/hr over 60 Minutes Intravenous On call to O.R. 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 POD3 s/p transduodenal ampullectomy and radical left nephrectomy. - XR reviewed - dilated loops of small bowel consistent with ileus, hopefully resolving since patient is passing flatus. Keep NG given high output, to decompress stomach and duodenum. - NPO, maintenance IV fluids. Ok for 2 cups water q shift, otherwise strict NPO. - Out of bed, PT, pulmonary toilet - VTE: Lovenox, SCDs - Dispo: inpatient, med-surg    LOS: 3 days    Sophronia Simas, MD [facility] Surgery General, Hepatobiliary and Pancreatic Surgery 12/10/20 8:14 AM

## 2020-12-10 NOTE — TOC Initial Note (Addendum)
Transition of Care [facility]) - Initial/Assessment Note    Patient Details  Name: Justin Davidson MRN: [id number] Date of Birth: January 28, 1946  Transition of Care [facility]) CM/SW Contact:    Kingsley Plan, RN Phone Number: 12/10/2020, 11:18 AM  Clinical Narrative:                 Patient from home with wife and daughter. Spoke to patient and daughter Justin Davidson at bedside.  Discussed PT recommendations for HHPT and supervision.  Provided medicare.gov list of home health agencies for them to start looking at. Justin Davidson would like [name]. Will call [facility] to see if can accept.   Also discussed 3 in1 and Rolator explained PT continuing to access for Rolator. Justin Davidson voiced understanding    Justin Davidson with [name] reviewing clinicals and will make a determination   Expected Discharge Plan: Home w Home Health Services     Patient Goals and CMS Choice Patient states their goals for this hospitalization and ongoing recovery are:: to return to home CMS Medicare.gov Compare Post Acute Care list provided to:: Patient Choice offered to / list presented to : Patient,Adult Children  Expected Discharge Plan and Services Expected Discharge Plan: Home w Home Health Services   Discharge Planning Services: CM Consult Post Acute Care Choice: Home Health,Durable Medical Equipment Living arrangements for the past 2 months: Skilled Nursing Facility                                      Prior Living Arrangements/Services Living arrangements for the past 2 months: Skilled Nursing Facility Lives with:: Spouse,Adult Children Patient language and need for interpreter reviewed:: Yes Do you feel safe going back to the place where you live?: Yes      Need for Family Participation in Patient Care: Yes (Comment) Care giver support system in place?: Yes (comment)   Criminal Activity/Legal Involvement Pertinent to Current Situation/Hospitalization: No - Comment as needed  Activities of Daily Living Home Assistive  Devices/Equipment: CPAP ADL Screening (condition at time of admission) Patient's cognitive ability adequate to safely complete daily activities?: Yes Is the patient deaf or have difficulty hearing?: Yes Does the patient have difficulty seeing, even when wearing glasses/contacts?: No Does the patient have difficulty concentrating, remembering, or making decisions?: Yes Patient able to express need for assistance with ADLs?: Yes Does the patient have difficulty dressing or bathing?: Yes Independently performs ADLs?: Yes (appropriate for developmental age) Does the patient have difficulty walking or climbing stairs?: No Weakness of Legs: None Weakness of Arms/Hands: None  Permission Sought/Granted   Permission granted to share information with : Yes, Verbal Permission Granted  Share Information with NAME: daughter Justin Davidson - Justin Davidson  Permission granted to share info w AGENCY: [name]        Emotional Assessment Appearance:: Appears stated age   Affect (typically observed): Accepting Orientation: : Oriented to Self,Oriented to Place,Oriented to  Time,Oriented to Situation Alcohol / Substance Use: Not Applicable Psych Involvement: No (comment)  Admission diagnosis:  Ampullary adenoma [D13.5] Duodenal adenoma [D13.2] Patient Active Problem List   Diagnosis Date Noted  . 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  . 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  . Abnormal findings on esophagogastroduodenoscopy (EGD) 07/19/2020  . Antiplatelet or antithrombotic long-term use 07/19/2020  . OSA on CPAP 04/01/2019  . 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 Essential hypertension 09/11/2018  . Morbid obesity due to excess calories (HCC) 09/11/2018  . Upper airway cough syndrome  09/10/2018  . DOE (dyspnea on exertion) 09/10/2018   PCP:  Merrilee Seashore, MD Pharmacy:   [facility] [address], [place] - [place] AT [place] [address] [place]  [address] Phone: [phone] Fax: [phone]     Social Determinants of Health ([place]) Interventions    Readmission Risk Interventions No flowsheet data found.

## 2020-12-10 NOTE — Progress Notes (Signed)
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 Cardell Peach), Gen surg (for duodenal mass removal). Path pending.   Today "Justin Davidson" is stable. Cr 1.54 with good UOP. No fevers. Remains NPO per gen-surg, feel "hungry" but still bilious NGT output.    Objective: Vital signs in last 24 hours: Temp:  [98.8 F (37.1 C)-99.3 F (37.4 C)] 98.8 F (37.1 C) (12/31 0411) Pulse Rate:  [71-89] 86 (12/31 0411) Resp:  [17-24] 20 (12/31 0413) BP: (117-173)/(56-121) 162/65 (12/31 0411) SpO2:  [91 %-98 %] 95 % (12/31 0413) Arterial Line BP: (161-180)/(57-64) 161/57 (12/30 0900) Last BM Date:  (PTA)  Intake/Output from previous day: 12/30 0701 - 12/31 0700 In: 2149 [P.O.:270; I.V.:1879] Out: 3980 [Urine:1175; Emesis/NG output:2650; Drains:155] Intake/Output this shift: No intake/output data recorded.  General appearance: alert and cooperative Eyes: negative Throat: lips, mucosa, and tongue normal; teeth and gums normal Back: symmetric, no curvature. ROM normal. No CVA tenderness. Resp: non-labored on [place] O2 GI: Large truncal obesity. Midline incision site c/d/i with staples.  Male genitalia: normal, no foley. 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  Extremities: extremities normal, atraumatic, no cyanosis or edema Lymph nodes: Cervical, supraclavicular, and axillary nodes normal.  Lab Results:  Recent Labs    12/09/20 0308 12/10/20 0138  WBC 15.2* 14.9*  HGB 10.5* 10.7*  HCT 31.6* 32.1*  PLT 207 211   BMET Recent Labs    12/09/20 0308 12/10/20 0138  NA 139 142  K 3.8 3.2*  CL 105 106  CO2 24 27  GLUCOSE 118* 110*  BUN 20 21  CREATININE 1.60* 1.54*  CALCIUM 8.2* 8.3*   PT/INR No results for input(s): LABPROT, INR in the last 72 hours. ABG Recent Labs    12/07/20 1453 12/07/20 1539  PHART 7.347* 7.320*  HCO3 20.2 21.8    Studies/Results: No results  found.  Anti-infectives: Anti-infectives (From admission, onward)   Start     Dose/Rate Route Frequency Ordered Stop   12/07/20 0600  ceFAZolin (ANCEF) 3 g in dextrose 5 % 50 mL IVPB  Status:  Discontinued        3 g 100 mL/hr over 30 Minutes Intravenous  Once 12/06/20 0641 12/07/20 0547   12/07/20 0600  cefTRIAXone (ROCEPHIN) 2 g in sodium chloride 0.9 % 100 mL IVPB  Status:  Discontinued       "And" Linked Group Details   2 g 200 mL/hr over 30 Minutes Intravenous On call to O.R. 12/07/20 0543 12/07/20 1725   12/07/20 0600  metroNIDAZOLE (FLAGYL) IVPB 500 mg       "And" Linked Group Details   500 mg 100 mL/hr over 60 Minutes Intravenous On call to O.R. 12/07/20 0543 12/07/20 9323      Assessment/Plan:  1 - Left Renal Neoplasm - no furhter GU cancer directed therapy this admission. Overall renal function remains acceptable. Agree with diet advancement per gen surg. Please call with questions.   Sebastian Ache 12/10/2020

## 2020-12-11 LAB — BPAM RBC
Blood Product Expiration Date: 202201182359
Blood Product Expiration Date: 202201192359
Blood Product Expiration Date: 202201202359
Blood Product Expiration Date: 202201202359
ISSUE DATE / TIME: 202112281457
ISSUE DATE / TIME: 202112281457
Unit Type and Rh: 6200
Unit Type and Rh: 6200
Unit Type and Rh: 6200
Unit Type and Rh: 6200

## 2020-12-11 LAB — TYPE AND SCREEN
ABO/RH(D): A POS
Antibody Screen: NEGATIVE
Unit division: 0
Unit division: 0
Unit division: 0
Unit division: 0

## 2020-12-11 LAB — COMPREHENSIVE METABOLIC PANEL
ALT: 32 U/L (ref 0–44)
AST: 31 U/L (ref 15–41)
Albumin: 2.4 g/dL — ABNORMAL LOW (ref 3.5–5.0)
Alkaline Phosphatase: 60 U/L (ref 38–126)
Anion gap: 10 (ref 5–15)
BUN: 20 mg/dL (ref 8–23)
CO2: 27 mmol/L (ref 22–32)
Calcium: 8.1 mg/dL — ABNORMAL LOW (ref 8.9–10.3)
Chloride: 105 mmol/L (ref 98–111)
Creatinine, Ser: 1.33 mg/dL — ABNORMAL HIGH (ref 0.61–1.24)
GFR, Estimated: 55 mL/min — ABNORMAL LOW (ref >60)
Glucose, Bld: 98 mg/dL (ref 70–99)
Potassium: 3.1 mmol/L — ABNORMAL LOW (ref 3.5–5.1)
Sodium: 142 mmol/L (ref 135–145)
Total Bilirubin: 1.2 mg/dL (ref 0.3–1.2)
Total Protein: 5.6 g/dL — ABNORMAL LOW (ref 6.5–8.1)

## 2020-12-11 LAB — GLUCOSE, CAPILLARY
Glucose-Capillary: 74 mg/dL (ref 70–99)
Glucose-Capillary: 75 mg/dL (ref 70–99)
Glucose-Capillary: 83 mg/dL (ref 70–99)
Glucose-Capillary: 84 mg/dL (ref 70–99)
Glucose-Capillary: 88 mg/dL (ref 70–99)
Glucose-Capillary: 89 mg/dL (ref 70–99)

## 2020-12-11 MED ORDER — POTASSIUM CHLORIDE 10 MEQ/100ML IV SOLN
10.0000 meq | INTRAVENOUS | Status: AC
  Administered 2020-12-11 (×4): 10 meq via INTRAVENOUS
  Filled 2020-12-11 (×4): qty 100

## 2020-12-11 NOTE — Progress Notes (Addendum)
Pt got up to pee and accidentally got Ng tube pulled out. Pt refused to get NG tube be put back at this time. Called on call Dr. Dossie Der and made aware and stated NG tube placement can be hold off at this time.

## 2020-12-11 NOTE — Progress Notes (Signed)
PT Cancellation Note  Patient Details Name: Justin Davidson MRN: [id number] DOB: 09-17-1946   Cancelled Treatment:    Reason Eval/Treat Not Completed: Patient declined, no reason specified Pt declining physical therapy session today due to fatigue. Encouraged out of bed with nursing staff.  Justin Davidson, PT, DPT Acute Rehabilitation Services Pager [phone] Office [phone]    Justin Davidson 12/11/2020, 3:30 PM

## 2020-12-11 NOTE — Progress Notes (Signed)
4 Days Post-Op Subjective: Patient reports feeling better. NG out  Objective: Vital signs in last 24 hours: Temp:  [98.2 F (36.8 C)-98.6 F (37 C)] 98.2 F (36.8 C) (01/01 0433) Pulse Rate:  [69-76] 69 (01/01 0433) Resp:  [12-19] 12 (01/01 0757) BP: (123-173)/(64-89) 167/87 (01/01 0433) SpO2:  [95 %-99 %] 96 % (01/01 0757)  Intake/Output from previous day: 12/31 0701 - 01/01 0700 In: 2809.2 [P.O.:990; I.V.:1819.2] Out: 2900 [Urine:1120; Emesis/NG output:1500; Drains:280] Intake/Output this shift: Total I/O In: -  Out: 60 [Drains:60]  Physical Exam:  General:alert GI: midline incision with staples Male genitalia: not done   Lab Results: Recent Labs    12/09/20 0308 12/10/20 0138  HGB 10.5* 10.7*  HCT 31.6* 32.1*   BMET Recent Labs    12/10/20 0138 12/11/20 0216  NA 142 142  K 3.2* 3.1*  CL 106 105  CO2 27 27  GLUCOSE 110* 98  BUN 21 20  CREATININE 1.54* 1.33*  CALCIUM 8.3* 8.1*   No results for input(s): LABPT, INR in the last 72 hours. No results for input(s): LABURIN in the last 72 hours. Results for orders placed or performed during the hospital encounter of 12/06/20  SARS CORONAVIRUS 2 (TAT 6-24 HRS) Nasopharyngeal Nasopharyngeal Swab     Status: None   Collection Time: 12/06/20  2:04 PM   Specimen: Nasopharyngeal Swab  Result Value Ref Range Status   SARS Coronavirus 2 NEGATIVE NEGATIVE Final    Comment: (NOTE) SARS-CoV-2 target nucleic acids are NOT DETECTED.  The SARS-CoV-2 RNA is generally detectable in upper and lower respiratory specimens during the acute phase of infection. Negative results do not preclude SARS-CoV-2 infection, do not rule out co-infections with other pathogens, and should not be used as the sole basis for treatment or other patient management decisions. Negative results must be combined with clinical observations, patient history, and epidemiological information. The expected result is Negative.  Fact Sheet for  Patients: HairSlick.no  Fact Sheet for Healthcare Providers: quierodirigir.com  This test is not yet approved or cleared by the [place] FDA and  has been authorized for detection and/or diagnosis of SARS-CoV-2 by FDA under an Emergency Use Authorization (EUA). This EUA will remain  in effect (meaning this test can be used) for the duration of the COVID-19 declaration under Se ction 564(b)(1) of the Act, 21 U.S.C. section 360bbb-3(b)(1), unless the authorization is terminated or revoked sooner.  Performed at [facility], [address] [place], [place] [address]     Studies/Results: DG Abd 1 View  Result Date: 12/10/2020 CLINICAL DATA:  NG tube, nausea, vomiting EXAM: ABDOMEN - 1 VIEW COMPARISON:  10/08/2020 CT FINDINGS: NG tube is in the stomach. Dilated small bowel loops most notable in the right abdomen compatible with small bowel obstruction. IMPRESSION: NG tube tip in the stomach. Small bowel obstruction pattern. Electronically Signed   By: Charlett Nose M.D.   On: 12/10/2020 08:02    Assessment/Plan1} 1 - Left Renal Neoplasm - no furhter GU cancer directed therapy this admission. Overall renal function remains acceptable/improving.Cr =1.33 Agree with diet advancement per gen surg. Please call with questions.    LOS: 4 days   Belva Agee 12/11/2020, 8:09 AM

## 2020-12-11 NOTE — Progress Notes (Signed)
4 Days Post-Op  Subjective: NG became dislodged overnight. Patient denies nausea and vomiting this morning. Passing flatus. Creatinine downtrending, good UOP.   Objective: Vital signs in last 24 hours: Temp:  [97.6 F (36.4 C)-98.6 F (37 C)] 97.6 F (36.4 C) (01/01 1500) Pulse Rate:  [64-72] 68 (01/01 1500) Resp:  [12-19] 17 (01/01 1238) BP: (98-173)/(55-87) 120/85 (01/01 1233) SpO2:  [95 %-100 %] 100 % (01/01 1013) FiO2 (%):  [64 %] 64 % (01/01 1238) Last BM Date: 12/04/20  Intake/Output from previous day: 12/31 0701 - 01/01 0700 In: 2809.2 [P.O.:990; I.V.:1819.2] Out: 2900 [Urine:1120; Emesis/NG output:1500; Drains:280] Intake/Output this shift: Total I/O In: 460 [P.O.:460] Out: 710 [Urine:650; Drains:60]  PE: General: resting comfortably, NAD Neuro: alert and oriented, no focal deficits Resp: normal work of breathing Abdomen: soft, nondistended, nontender to palpation. Incision c/d/i with staples in place. JP serosanguinous. Extremities: warm and well-perfused   Lab Results:  Recent Labs    12/09/20 0308 12/10/20 0138  WBC 15.2* 14.9*  HGB 10.5* 10.7*  HCT 31.6* 32.1*  PLT 207 211   BMET Recent Labs    12/10/20 0138 12/11/20 0216  NA 142 142  K 3.2* 3.1*  CL 106 105  CO2 27 27  GLUCOSE 110* 98  BUN 21 20  CREATININE 1.54* 1.33*  CALCIUM 8.3* 8.1*   PT/INR No results for input(s): LABPROT, INR in the last 72 hours. CMP     Component Value Date/Time   NA 142 12/11/2020 0216   K 3.1 (L) 12/11/2020 0216   CL 105 12/11/2020 0216   CO2 27 12/11/2020 0216   GLUCOSE 98 12/11/2020 0216   BUN 20 12/11/2020 0216   CREATININE 1.33 (H) 12/11/2020 0216   CALCIUM 8.1 (L) 12/11/2020 0216   PROT 5.6 (L) 12/11/2020 0216   ALBUMIN 2.4 (L) 12/11/2020 0216   AST 31 12/11/2020 0216   ALT 32 12/11/2020 0216   ALKPHOS 60 12/11/2020 0216   BILITOT 1.2 12/11/2020 0216   GFRNONAA 55 (L) 12/11/2020 0216   GFRAA >60 09/13/2020 1350   Lipase  No results  found for: LIPASE     Studies/Results: DG Abd 1 View  Result Date: 12/10/2020 CLINICAL DATA:  NG tube, nausea, vomiting EXAM: ABDOMEN - 1 VIEW COMPARISON:  10/08/2020 CT FINDINGS: NG tube is in the stomach. Dilated small bowel loops most notable in the right abdomen compatible with small bowel obstruction. IMPRESSION: NG tube tip in the stomach. Small bowel obstruction pattern. Electronically Signed   By: Charlett Nose M.D.   On: 12/10/2020 08:02    Anti-infectives: Anti-infectives (From admission, onward)   Start     Dose/Rate Route Frequency Ordered Stop   12/07/20 0600  ceFAZolin (ANCEF) 3 g in dextrose 5 % 50 mL IVPB  Status:  Discontinued        3 g 100 mL/hr over 30 Minutes Intravenous  Once 12/06/20 0641 12/07/20 0547   12/07/20 0600  cefTRIAXone (ROCEPHIN) 2 g in sodium chloride 0.9 % 100 mL IVPB  Status:  Discontinued       "And" Linked Group Details   2 g 200 mL/hr over 30 Minutes Intravenous On call to O.R. 12/07/20 0543 12/07/20 1725   12/07/20 0600  metroNIDAZOLE (FLAGYL) IVPB 500 mg       "And" Linked Group Details   500 mg 100 mL/hr over 60 Minutes Intravenous On call to O.R. 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 POD4 s/p transduodenal ampullectomy and radical left nephrectomy. - Limited clear liquids today - Maintenance IV fluids - Out of bed - VTE: lovenox, SCDs   LOS: 4 days    Michaelle Birks, MD [facility] Surgery General, Hepatobiliary and Pancreatic Surgery 12/11/20 3:12 PM

## 2020-12-12 LAB — GLUCOSE, CAPILLARY
Glucose-Capillary: 101 mg/dL — ABNORMAL HIGH (ref 70–99)
Glucose-Capillary: 101 mg/dL — ABNORMAL HIGH (ref 70–99)
Glucose-Capillary: 108 mg/dL — ABNORMAL HIGH (ref 70–99)
Glucose-Capillary: 90 mg/dL (ref 70–99)
Glucose-Capillary: 93 mg/dL (ref 70–99)
Glucose-Capillary: 94 mg/dL (ref 70–99)
Glucose-Capillary: 95 mg/dL (ref 70–99)

## 2020-12-12 LAB — BASIC METABOLIC PANEL
Anion gap: 12 (ref 5–15)
BUN: 17 mg/dL (ref 8–23)
CO2: 25 mmol/L (ref 22–32)
Calcium: 8 mg/dL — ABNORMAL LOW (ref 8.9–10.3)
Chloride: 102 mmol/L (ref 98–111)
Creatinine, Ser: 1.27 mg/dL — ABNORMAL HIGH (ref 0.61–1.24)
GFR, Estimated: 59 mL/min — ABNORMAL LOW (ref >60)
Glucose, Bld: 101 mg/dL — ABNORMAL HIGH (ref 70–99)
Potassium: 3.2 mmol/L — ABNORMAL LOW (ref 3.5–5.1)
Sodium: 139 mmol/L (ref 135–145)

## 2020-12-12 MED ORDER — METOPROLOL TARTRATE 50 MG PO TABS
50.0000 mg | ORAL_TABLET | Freq: Two times a day (BID) | ORAL | Status: DC
Start: 2020-12-12 — End: 2020-12-14
  Administered 2020-12-12 – 2020-12-14 (×5): 50 mg via ORAL
  Filled 2020-12-12 (×5): qty 1

## 2020-12-12 MED ORDER — DONEPEZIL HCL 23 MG PO TABS
23.0000 mg | ORAL_TABLET | Freq: Every day | ORAL | Status: DC
Start: 2020-12-12 — End: 2020-12-14
  Administered 2020-12-12 – 2020-12-14 (×3): 23 mg via ORAL
  Filled 2020-12-12 (×3): qty 1

## 2020-12-12 MED ORDER — TAMSULOSIN HCL 0.4 MG PO CAPS
0.4000 mg | ORAL_CAPSULE | Freq: Every day | ORAL | Status: DC
Start: 2020-12-12 — End: 2020-12-14
  Administered 2020-12-12 – 2020-12-14 (×3): 0.4 mg via ORAL
  Filled 2020-12-12 (×3): qty 1

## 2020-12-12 MED ORDER — TRAMADOL HCL 50 MG PO TABS
50.0000 mg | ORAL_TABLET | Freq: Four times a day (QID) | ORAL | Status: DC | PRN
  Administered 2020-12-13: 50 mg via ORAL
  Filled 2020-12-12: qty 1

## 2020-12-12 MED ORDER — HYDROMORPHONE HCL 1 MG/ML IJ SOLN: 0.2000 mg | INTRAMUSCULAR | Status: DC | PRN

## 2020-12-12 MED ORDER — ASPIRIN EC 81 MG PO TBEC
81.0000 mg | DELAYED_RELEASE_TABLET | Freq: Every day | ORAL | Status: DC
  Administered 2020-12-12 – 2020-12-14 (×3): 81 mg via ORAL
  Filled 2020-12-12 (×3): qty 1

## 2020-12-12 MED ORDER — ATORVASTATIN CALCIUM 80 MG PO TABS
80.0000 mg | ORAL_TABLET | Freq: Every day | ORAL | Status: DC
  Administered 2020-12-12 – 2020-12-14 (×3): 80 mg via ORAL
  Filled 2020-12-12 (×3): qty 1

## 2020-12-12 MED ORDER — AMLODIPINE BESYLATE 10 MG PO TABS
10.0000 mg | ORAL_TABLET | Freq: Every day | ORAL | Status: DC
  Administered 2020-12-12 – 2020-12-14 (×3): 10 mg via ORAL
  Filled 2020-12-12 (×3): qty 1

## 2020-12-12 MED ORDER — PANTOPRAZOLE SODIUM 40 MG PO TBEC
40.0000 mg | DELAYED_RELEASE_TABLET | Freq: Every day | ORAL | Status: DC
  Administered 2020-12-12 – 2020-12-14 (×3): 40 mg via ORAL
  Filled 2020-12-12 (×3): qty 1

## 2020-12-12 MED ORDER — DOCUSATE SODIUM 100 MG PO CAPS
100.0000 mg | ORAL_CAPSULE | Freq: Every day | ORAL | Status: DC
  Administered 2020-12-12 – 2020-12-14 (×3): 100 mg via ORAL
  Filled 2020-12-12 (×3): qty 1

## 2020-12-12 NOTE — Progress Notes (Signed)
3 bowel movements observed today. First one was formed but subsequent ones were rather loose and watery

## 2020-12-12 NOTE — Progress Notes (Signed)
Dilaudid pca discontinued. 21mg  wasted per protocol with witnessing the waste

## 2020-12-12 NOTE — Progress Notes (Signed)
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 PMH consists of CAD, HTN, dementia, and anxiety.    PT Comments    Pt progressing towards his physical therapy goals; requires encouragement for participation. Ambulating 300 feet with a walker at a min guard assist level. Pt demonstrates impulsivity; cues provided for activity pacing. Will continue to follow acutely to promote mobility.    Follow Up Recommendations  Supervision/Assistance - 24 hour;No PT follow up     Equipment Recommendations  3in1 (bariatric);Other (comment) (bariatric Rollator)    Recommendations for Other Services       Precautions / Restrictions Precautions Precautions: Fall Restrictions Weight Bearing Restrictions: No    Mobility  Bed Mobility Overal bed mobility: Modified Independent                Transfers Overall transfer level: Needs assistance Equipment used: None;Rolling walker (2 wheeled) Transfers: Sit to/from [employer] Transfers Sit to Stand: Supervision Stand pivot transfers: Supervision       General transfer comment: Supervision for safety  Ambulation/Gait Ambulation/Gait assistance: Min guard Gait Distance (Feet): 300 Feet Assistive device: Rolling walker (2 wheeled) Gait Pattern/deviations: Decreased stride length;Step-through pattern     General Gait Details: Pt impulsive, with fast cadence, min guard for safety. One mild LOB which pt self corrected   Stairs             Wheelchair Mobility    Modified Rankin (Stroke Patients Only)       Balance Overall balance assessment: Needs assistance Sitting-balance support: No upper extremity supported;Feet supported Sitting balance-Leahy Scale: Good      Standing balance support: No upper extremity supported Standing balance-Leahy Scale: Fair                              Cognition Arousal/Alertness: Awake/alert Behavior During Therapy: Impulsive Overall Cognitive Status: Impaired/Different from baseline Area of Impairment: Safety/judgement                         Safety/Judgement: Decreased awareness of safety;Decreased awareness of deficits            Exercises      General Comments        Pertinent Vitals/Pain Pain Assessment: No/denies pain    Home Living                      Prior Function            PT Goals (current goals can now be found in the care plan section) Acute Rehab PT Goals Patient Stated Goal: home PT Goal Formulation: With patient Time For Goal Achievement: 12/22/20 Potential to Achieve Goals: Good Progress towards PT goals: Progressing toward goals    Frequency    Min 3X/week      PT Plan Discharge plan needs to be updated    Co-evaluation              AM-PAC PT "6 Clicks" Mobility   Outcome Measure  Help needed turning from your back to your side while in a flat bed without using bedrails?: None Help needed moving from lying on your back to sitting on the side of a flat bed without using bedrails?:  None Help needed moving to and from a bed to a chair (including a wheelchair)?: None Help needed standing up from a chair using your arms (e.g., wheelchair or bedside chair)?: None Help needed to walk in hospital room?: A Little Help needed climbing 3-5 steps with a railing? : A Lot 6 Click Score: 21    End of Session   Activity Tolerance: Patient tolerated treatment well Patient left: in chair;with call bell/phone within reach;with chair alarm set Nurse Communication: Mobility status PT Visit Diagnosis: Muscle weakness (generalized) (M62.81);Pain;Difficulty in walking, not elsewhere classified (R26.2)     Time: [id number] PT Time  Calculation (min) (ACUTE ONLY): 22 min  Charges:  $Therapeutic Activity: 8-22 mins                     Lillia Pauls, PT, DPT Acute Rehabilitation Services Pager [phone] Office [phone]    Norval Morton 12/12/2020, 3:52 PM

## 2020-12-12 NOTE — Progress Notes (Signed)
5 Days Post-Op  Subjective: Tolerating clear liquids. Denies nausea/vomiting. Passing flatus but no bowel movements. Did not get out of bed yesterday, declined PT. BMP pending this morning.   Objective: Vital signs in last 24 hours: Temp:  [97.5 F (36.4 C)-98.2 F (36.8 C)] 97.9 F (36.6 C) (01/02 0336) Pulse Rate:  [64-77] 77 (01/02 0336) Resp:  [12-20] 17 (01/02 0336) BP: (98-176)/(55-104) 176/79 (01/02 0336) SpO2:  [95 %-100 %] 97 % (01/02 0336) FiO2 (%):  [64 %-71 %] 71 % (01/01 1524) Last BM Date: 12/04/20  Intake/Output from previous day: 01/01 0701 - 01/02 0700 In: 3570.7 [P.O.:1180; I.V.:1990.6; IV Piggyback:400] Out: 2140 [Urine:1600; Drains:540] Intake/Output this shift: Total I/O In: 1029.7 [P.O.:240; I.V.:789.7] Out: 710 [Urine:400; Drains:310]  PE: General: resting comfortably, NAD Neuro: alert and oriented, no focal deficits Resp: normal work of breathing Abdomen: soft, nondistended, nontender to palpation. Midline incision c/d/i with staples in place, JP with serosanguinous fluid. Extremities: warm and well-perfused   Lab Results:  Recent Labs    12/10/20 0138  WBC 14.9*  HGB 10.7*  HCT 32.1*  PLT 211   BMET Recent Labs    12/10/20 0138 12/11/20 0216  NA 142 142  K 3.2* 3.1*  CL 106 105  CO2 27 27  GLUCOSE 110* 98  BUN 21 20  CREATININE 1.54* 1.33*  CALCIUM 8.3* 8.1*   PT/INR No results for input(s): LABPROT, INR in the last 72 hours. CMP     Component Value Date/Time   NA 142 12/11/2020 0216   K 3.1 (L) 12/11/2020 0216   CL 105 12/11/2020 0216   CO2 27 12/11/2020 0216   GLUCOSE 98 12/11/2020 0216   BUN 20 12/11/2020 0216   CREATININE 1.33 (H) 12/11/2020 0216   CALCIUM 8.1 (L) 12/11/2020 0216   PROT 5.6 (L) 12/11/2020 0216   ALBUMIN 2.4 (L) 12/11/2020 0216   AST 31 12/11/2020 0216   ALT 32 12/11/2020 0216   ALKPHOS 60 12/11/2020 0216   BILITOT 1.2 12/11/2020 0216   GFRNONAA 55 (L) 12/11/2020 0216   GFRAA >60  09/13/2020 1350   Lipase  No results found for: LIPASE     Studies/Results: DG Abd 1 View  Result Date: 12/10/2020 CLINICAL DATA:  NG tube, nausea, vomiting EXAM: ABDOMEN - 1 VIEW COMPARISON:  10/08/2020 CT FINDINGS: NG tube is in the stomach. Dilated small bowel loops most notable in the right abdomen compatible with small bowel obstruction. IMPRESSION: NG tube tip in the stomach. Small bowel obstruction pattern. Electronically Signed   By: Rolm Baptise M.D.   On: 12/10/2020 08:02    Anti-infectives: Anti-infectives (From admission, onward)   Start     Dose/Rate Route Frequency Ordered Stop   12/07/20 0600  ceFAZolin (ANCEF) 3 g in dextrose 5 % 50 mL IVPB  Status:  Discontinued        3 g 100 mL/hr over 30 Minutes Intravenous  Once 12/06/20 0641 12/07/20 0547   12/07/20 0600  cefTRIAXone (ROCEPHIN) 2 g in sodium chloride 0.9 % 100 mL IVPB  Status:  Discontinued       "And" Linked Group Details   2 g 200 mL/hr over 30 Minutes Intravenous On call to O.R. 12/07/20 0543 12/07/20 1725   12/07/20 0600  metroNIDAZOLE (FLAGYL) IVPB 500 mg       "And" Linked Group Details   500 mg 100 mL/hr over 60 Minutes Intravenous On call to O.R. 12/07/20 [id number] 12/07/20 [id number]  Assessment/Plan 75 yo male with duodenal adenoma and left renal cancer, POD5 s/p transduodenal ampullectomy and left radical nephrectomy. - Advance to full liquid diet - Resume home PO medications - Discontinue PCA, transition to oral tramadol - KVO IV fluids - Patient encouraged to mobilize today - VTE: lovenox, SCDs - Dispo: inpatient, med-surg    LOS: 5 days    Sophronia Simas, MD [facility] Surgery General, Hepatobiliary and Pancreatic Surgery 12/12/20 6:29 AM

## 2020-12-13 ENCOUNTER — Ambulatory Visit: Payer: Medicare Other | Admitting: Cardiology

## 2020-12-13 LAB — GLUCOSE, CAPILLARY
Glucose-Capillary: 99 mg/dL (ref 70–99)
Glucose-Capillary: 114 mg/dL — ABNORMAL HIGH (ref 70–99)
Glucose-Capillary: 97 mg/dL (ref 70–99)
Glucose-Capillary: 127 mg/dL — ABNORMAL HIGH (ref 70–99)
Glucose-Capillary: 112 mg/dL — ABNORMAL HIGH (ref 70–99)

## 2020-12-13 MED ORDER — METAMUCIL FIBER PO CHEW: CHEWABLE_TABLET | Freq: Two times a day (BID) | ORAL | Status: DC

## 2020-12-13 MED ORDER — POTASSIUM CHLORIDE 10 MEQ/100ML IV SOLN
INTRAVENOUS | Status: AC
  Filled 2020-12-13: qty 100

## 2020-12-13 MED ORDER — ENSURE ENLIVE PO LIQD
237.0000 mL | Freq: Two times a day (BID) | ORAL | Status: DC
  Administered 2020-12-13 – 2020-12-14 (×2): 237 mL via ORAL

## 2020-12-13 MED ORDER — POTASSIUM CHLORIDE 10 MEQ/100ML IV SOLN
10.0000 meq | INTRAVENOUS | Status: AC
  Administered 2020-12-13 (×4): 10 meq via INTRAVENOUS
  Filled 2020-12-13 (×3): qty 100

## 2020-12-13 MED ORDER — POTASSIUM CHLORIDE CRYS ER 10 MEQ PO TBCR
10.0000 meq | EXTENDED_RELEASE_TABLET | Freq: Every day | ORAL | Status: DC
  Administered 2020-12-13 – 2020-12-14 (×2): 10 meq via ORAL
  Filled 2020-12-13 (×2): qty 1

## 2020-12-13 MED ORDER — PSYLLIUM 95 % PO PACK
1.0000 | PACK | Freq: Every day | ORAL | Status: DC
  Administered 2020-12-13 – 2020-12-14 (×2): 1 via ORAL
  Filled 2020-12-13 (×2): qty 1

## 2020-12-13 MED ORDER — CLONAZEPAM 0.5 MG PO TABS: 0.5000 mg | ORAL_TABLET | Freq: Every day | ORAL | Status: DC | PRN

## 2020-12-13 NOTE — Progress Notes (Addendum)
    6 Days Post-Op  Subjective: Tolerating full liquids. Denies nausea/vomiting. Had a bowel movement last night. Ambulated with PT.   Objective: Vital signs in last 24 hours: Temp:  [98 F (36.7 C)-98.3 F (36.8 C)] 98.1 F (36.7 C) (01/03 0323) Pulse Rate:  [71-72] 71 (01/03 0323) Resp:  [18] 18 (01/03 0323) BP: (136-147)/(56-72) 146/70 (01/03 0323) SpO2:  [94 %-95 %] 95 % (01/03 0323) Last BM Date: 12/12/20  Intake/Output from previous day: 01/02 0701 - 01/03 0700 In: 300 [P.O.:300] Out: 703 [Urine:350; Drains:350; Stool:3] Intake/Output this shift: No intake/output data recorded.  PE: General: resting comfortably, NAD Neuro: alert and oriented, no focal deficits Resp: normal work of breathing Abdomen: soft, nondistended, nontender to palpation. Midline incision c/d/i with staples in place, JP with serosanguinous fluid. Extremities: warm and well-perfused   Lab Results:  No results for input(s): WBC, HGB, HCT, PLT in the last 72 hours. BMET Recent Labs    12/11/20 0216 12/12/20 0650  NA 142 139  K 3.1* 3.2*  CL 105 102  CO2 27 25  GLUCOSE 98 101*  BUN 20 17  CREATININE 1.33* 1.27*  CALCIUM 8.1* 8.0*   PT/INR No results for input(s): LABPROT, INR in the last 72 hours. CMP     Component Value Date/Time   NA 139 12/12/2020 0650   K 3.2 (L) 12/12/2020 0650   CL 102 12/12/2020 0650   CO2 25 12/12/2020 0650   GLUCOSE 101 (H) 12/12/2020 0650   BUN 17 12/12/2020 0650   CREATININE 1.27 (H) 12/12/2020 0650   CALCIUM 8.0 (L) 12/12/2020 0650   PROT 5.6 (L) 12/11/2020 0216   ALBUMIN 2.4 (L) 12/11/2020 0216   AST 31 12/11/2020 0216   ALT 32 12/11/2020 0216   ALKPHOS 60 12/11/2020 0216   BILITOT 1.2 12/11/2020 0216   GFRNONAA 59 (L) 12/12/2020 0650   GFRAA >60 09/13/2020 1350   Lipase  No results found for: LIPASE     Studies/Results: No results found.  Anti-infectives: Anti-infectives (From admission, onward)   Start     Dose/Rate Route  Frequency Ordered Stop   12/07/20 0600  ceFAZolin (ANCEF) 3 g in dextrose 5 % 50 mL IVPB  Status:  Discontinued        3 g 100 mL/hr over 30 Minutes Intravenous  Once 12/06/20 0641 12/07/20 0547   12/07/20 0600  cefTRIAXone (ROCEPHIN) 2 g in sodium chloride 0.9 % 100 mL IVPB  Status:  Discontinued       "And" Linked Group Details   2 g 200 mL/hr over 30 Minutes Intravenous On call to O.R. 12/07/20 0543 12/07/20 1725   12/07/20 0600  metroNIDAZOLE (FLAGYL) IVPB 500 mg       "And" Linked Group Details   500 mg 100 mL/hr over 60 Minutes Intravenous On call to O.R. 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 - Advance to soft diet - Home PO medications, bowel regimen - JP drain removed today - VTE: lovenox, SCDs - Dispo: inpatient, med-surg. Anticipate discharge in next 1-2 days.    LOS: 6 days    Sophronia Simas, MD [facility] Surgery General, Hepatobiliary and Pancreatic Surgery 12/13/20 7:42 AM

## 2020-12-14 LAB — SURGICAL PATHOLOGY

## 2020-12-14 LAB — GLUCOSE, CAPILLARY
Glucose-Capillary: 104 mg/dL — ABNORMAL HIGH (ref 70–99)
Glucose-Capillary: 110 mg/dL — ABNORMAL HIGH (ref 70–99)
Glucose-Capillary: 113 mg/dL — ABNORMAL HIGH (ref 70–99)
Glucose-Capillary: 129 mg/dL — ABNORMAL HIGH (ref 70–99)

## 2020-12-14 MED ORDER — TRAMADOL HCL 50 MG PO TABS: 50.0000 mg | ORAL_TABLET | Freq: Four times a day (QID) | ORAL | 0 refills | Status: DC | PRN

## 2020-12-14 NOTE — Care Management Important Message (Signed)
Important Message  Patient Details  Name: Justin Davidson MRN: [id number] Date of Birth: Jan 03, 1946   Medicare Important Message Given:  Yes     Landon Truax Stefan Church 12/14/2020, 2:50 PM

## 2020-12-14 NOTE — Discharge Instructions (Signed)
[facility] DISCHARGE INSTRUCTIONS  Activity  No heavy lifting greater than 10 pounds for 4 weeks after surgery.  Ok to shower, but do not bathe or submerge incisions underwater.  Do not drive while taking narcotic pain medication.  Diet You should consume soft foods only. Eat small amounts throughout the day rather than large meals. You may find that you feel full early - this is common after surgery and will improve with time.  Wound Care  Your incision is closed with staples. These will need to be removed in the office about 1 week after discharge.   You may remove the dressing over your drain site in 24 hours after discharge (on Wednesday, Jan. 5).  You may shower and allow warm soapy water to run over your incision. Gently pat dry.  Do not submerge your incision underwater.  Monitor your incision for any new redness, tenderness, or drainage.  When to Call us:  Fever greater than 100.5  New redness, drainage, or swelling at incision site  Severe pain, nausea, or vomiting  Jaundice (yellowing of the whites of the eyes or skin)  Follow-up You have an appointment scheduled on 12/23/19 at 3:00pm to have your staples removed. You will see Dr. Zenia Resides on 01/06/20 at 1:50pm. Both of these appointments will be at the [facility] Surgery office at [address] [place], [place], [place]. Please arrive at least 15 minutes prior to your scheduled appointment time.  For questions or concerns, please call the [facility] Surgery office at [phone].  Urology Discharge Instructions:  Activity:  You are encouraged to ambulate frequently (about every hour during waking hours) to help prevent blood clots from forming in your legs or lungs.  However, you should not engage in any heavy lifting (> 10-15 lbs), strenuous activity, or straining.   Diet: You should advance your diet as instructed by your physician.  It will be normal to have some bloating,  nausea, and abdominal discomfort intermittently.   Prescriptions:  You will be provided a prescription for pain medication to take as needed.  If your pain is not severe enough to require the prescription pain medication, you may take extra strength Tylenol instead which will have less side effects.  You should also take a prescribed stool softener to avoid straining with bowel movements as the prescription pain medication may constipate you.   Incisions: You may remove your dressing bandages 48 hours after surgery if not removed in the hospital.  You will either have some small staples or special tissue glue at each of the incision sites. Once the bandages are removed (if present), the incisions may stay open to air.  You may start showering (but not soaking or bathing in water) the 2nd day after surgery and the incisions simply need to be patted dry after the shower.  No additional care is needed.   What to call us about: You should call the [facility] office [phone]) if you develop fever > 101 or develop persistent vomiting. Activity:  You are encouraged to ambulate frequently (about every hour during waking hours) to help prevent blood clots from forming in your legs or lungs.  However, you should not engage in any heavy lifting (> 10-15 lbs), strenuous activity, or straining.    Soft-Food Eating Plan A soft-food eating plan includes foods that are safe and easy to chew and swallow. Your health care provider or dietitian can help you find foods and flavors that fit into  this plan. Follow this plan until your health care provider or dietitian says it is safe to start eating other foods and food textures. What are tips for following this plan? General guidelines   Take small bites of food, or cut food into pieces about  inch or smaller. Bite-sized pieces of food are easier to chew and swallow.  Eat moist foods. Avoid overly dry foods.  Avoid foods that: ? Are difficult to  swallow, such as dry, chunky, crispy, or sticky foods. ? Are difficult to chew, such as hard, tough, or stringy foods. ? Contain nuts, seeds, or fruits.  Follow instructions from your dietitian about the types of liquids that are safe for you to swallow. You may be allowed to have: ? Thick liquids only. This includes only liquids that are thicker than honey. ? Thin and thick liquids. This includes all beverages and foods that become liquid at room temperature.  To make thick liquids: ? Purchase a commercial liquid thickening powder. These are available at grocery stores and pharmacies. ? Mix the thickener into liquids according to instructions on the label. ? Purchase ready-made thickened liquids. ? Thicken soup by pureeing, straining to remove chunks, and adding flour, potato flakes, or corn starch. ? Add commercial thickener to foods that become liquid at room temperature, such as milk shakes, yogurt, ice cream, gelatin, and sherbet.  Ask your health care provider whether you need to take a fiber supplement. Cooking  Cook meats so they stay tender and moist. Use methods like braising, stewing, or baking in liquid.  Cook vegetables and fruit until they are soft enough to be mashed with a fork.  Peel soft, fresh fruits such as peaches, nectarines, and melons.  When making soup, make sure chunks of meat and vegetables are smaller than  inch.  Reheat leftover foods slowly so that a tough crust does not form. What foods are allowed? The items listed below may not be a complete list. Talk with your dietitian about what dietary choices are best for you. Grains Breads, muffins, pancakes, or waffles moistened with syrup, jelly, or butter. Dry cereals well-moistened with milk. Moist, cooked cereals. Well-cooked pasta and rice. Vegetables All soft-cooked vegetables. Shredded lettuce. Fruits All canned and cooked fruits. Soft, peeled fresh fruits. Strawberries. Dairy Milk. Cream. Yogurt.  Cottage cheese. Soft cheese without the rind. Meats and other protein foods Tender, moist ground meat, poultry, or fish. Meat cooked in gravy or sauces. Eggs. Sweets and desserts Ice cream. Milk shakes. Sherbet. Pudding. Fats and oils Butter. Margarine. Olive, canola, sunflower, and grapeseed oil. Smooth salad dressing. Smooth cream cheese. Mayonnaise. Gravy. What foods are not allowed? The items listed bemay not be a complete list. Talk with your dietitian about what dietary choices are best for you. Grains Coarse or dry cereals, such as bran, granola, and shredded wheat. Tough or chewy crusty breads, such as [place] bread or baguettes. Breads with nuts, seeds, or fruit. Vegetables All raw vegetables. Cooked corn. Cooked vegetables that are tough or stringy. Tough, crisp, fried potatoes and potato skins. Fruits Fresh fruits with skins or seeds, or both, such as apples, pears, and grapes. Stringy, high-pulp fruits, such as papaya, pineapple, coconut, and mango. Fruit leather and all dried fruit. Dairy Yogurt with nuts or coconut. Meats and other protein foods Hard, dry sausages. Dry meat, poultry, or fish. Meats with gristle. Fish with bones. Fried meat or fish. Lunch meat and hotdogs. Nuts and seeds. Chunky peanut butter or other nut butters. Sweets and  desserts Cakes or cookies that are very dry or chewy. Desserts with dried fruit, nuts, or coconut. Fried pastries. Very rich pastries. Fats and oils Cream cheese with fruit or nuts. Salad dressings with seeds or chunks. Summary  A soft-food eating plan includes foods that are safe and easy to swallow. Generally, the foods should be soft enough to be mashed with a fork.  Avoid foods that are dry, hard to chew, crunchy, sticky, stringy, or crispy.  Ask your health care provider whether you need to thicken your liquids and if you need to take a fiber supplement. This information is not intended to replace advice given to you by your health  care provider. Make sure you discuss any questions you have with your health care provider. Document Revised: 03/20/2019 Document Reviewed: 01/30/2017 Elsevier Patient Education  [place].

## 2020-12-14 NOTE — Plan of Care (Signed)
  Problem: Health Behavior/Discharge Planning: Goal: Ability to manage health-related needs will improve Outcome: Completed/Met   Problem: Clinical Measurements: Goal: Ability to maintain clinical measurements within normal limits will improve Outcome: Completed/Met Goal: Will remain free from infection Outcome: Completed/Met Goal: Diagnostic test results will improve Outcome: Progressing Goal: Respiratory complications will improve Outcome: Completed/Met Goal: Cardiovascular complication will be avoided Outcome: Completed/Met   Problem: Activity: Goal: Risk for activity intolerance will decrease Outcome: Completed/Met   Problem: Nutrition: Goal: Adequate nutrition will be maintained Outcome: Progressing   Problem: Elimination: Goal: Will not experience complications related to bowel motility Outcome: Completed/Met Goal: Will not experience complications related to urinary retention Outcome: Completed/Met   Problem: Pain Managment: Goal: General experience of comfort will improve Outcome: Completed/Met   Problem: Safety: Goal: Ability to remain free from injury will improve Outcome: Progressing   Problem: Skin Integrity: Goal: Risk for impaired skin integrity will decrease Outcome: Progressing

## 2020-12-14 NOTE — Progress Notes (Signed)
Patient discharged to home with instructions given to his daughter.

## 2020-12-14 NOTE — Progress Notes (Signed)
7 Days Post-Op Subjective: Pain controlled. No nausea or emesis. Passing flatus, having BMs. Tolerating fulls. Voiding.  Objective: Vital signs in last 24 hours: Temp:  [97.7 F (36.5 C)-98 F (36.7 C)] 97.7 F (36.5 C) (01/04 0419) Pulse Rate:  [69-74] 70 (01/04 0419) Resp:  [17-20] 17 (01/04 0419) BP: (124-161)/(59-74) 144/63 (01/04 0419) SpO2:  [93 %-96 %] 93 % (01/04 0419)  Intake/Output from previous day: 01/03 0701 - 01/04 0700 In: 720 [P.O.:720] Out: 250 [Urine:250] Intake/Output this shift: No intake/output data recorded.   UOP: poorly recorded  Physical Exam:  General: Alert and oriented CV: RRR Lungs: Clear Abdomen: Soft, ND, ATTP; inc c/d/I Ext: NT, No erythema  Lab Results: No results for input(s): HGB, HCT in the last 72 hours. BMET Recent Labs    12/12/20 0650  NA 139  K 3.2*  CL 102  CO2 25  GLUCOSE 101*  BUN 17  CREATININE 1.27*  CALCIUM 8.0*     Studies/Results: No results found.  Assessment/Plan: 1. 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 Duodenal adenoma s/p transduodenal ampullectomy with cholecystectomy by Dr. Freida Busman 12/07/2020  -Pain control prn -Diet per surgery -Voiding without difficulty -Cr downtrended to 1.27 -OOB, amb, IS -Ok for discharge home from GU standpoint   LOS: 7 days   Matt R. Piercen Covino MD 12/14/2020, 9:16 AM [facility]  Pager: [phone]

## 2020-12-15 NOTE — Discharge Summary (Signed)
Physician Discharge Summary  Patient ID: Justin Davidson MRN: [id number] DOB/AGE: 01/09/1946 75 y.o.  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 After an extensive preop discussion and workup, he agreed to proceed with transduodenal ampullectomy and left nephrectomy. He was taken to the OR on 12/07/20 for open transduodenal ampullectomy and cholecystectomy by general surgery and radical left nephrectomy by urology. For further details of these procedures please see separately dictated operative notes. Postoperatively he was admitted to the ICU. Foley was removed on POD1 and he was able to void. NG tube was maintained due to high volume bilious output. POD2 he remained hemodynamically stable and was transferred to a med-surg floor. He had good UOP and creatinine peaked at 1.6 on POD2. NG tube became dislodged on POD3 and patient did not have any nausea. His diet was slowly advanced over the next few days to a soft diet, which he tolerated. JP drain adjacent to the duodenum remained nonbilious and was removed on POD6. The patient had return of bowel function, ambulated and worked with physical therapy and was cleared for discharge home. On POD7 the patient was tolerating PO intake, having bowel function, ambulating, and pain was controlled on oral medications. He was examined and deemed appropriate for discharge home.  Consults: None  Significant Diagnostic Studies: None  Treatments: surgery: transduodenal ampullectomy, cholecystectomy, radical left nephrectomy  Discharge Exam: Blood pressure (!) 144/63, pulse 70, temperature 97.7 F (36.5 C),  temperature source Oral, resp. rate 17, height 5\' 10"  (1.778 m), weight (!) 139 kg, SpO2 93 %. General appearance: alert and cooperative Eyes: no scleral icterus Neck: trachea midline Resp: normal work of breathing on room air GI: soft, non-tender; bowel sounds normal; no masses,  no organomegaly Extremities: warm and well-perfused Incision/Wound: midline incision clean and dry with staples in place  Disposition: Discharge disposition: 01-Home or Self Care       Discharge Instructions    Call MD for:  persistant nausea and vomiting   Complete by: As directed    Call MD for:  redness, tenderness, or signs of infection (pain, swelling, redness, odor or green/yellow discharge around incision site)   Complete by: As directed    Call MD for:  severe uncontrolled pain   Complete by: As directed    Call MD for:  temperature >100.4   Complete by: As directed    Discharge wound care:   Complete by: As directed    Ok to shower over incision. Gently pat dry when done. Do not bathe, swim or submerge incision underwater.   Increase activity slowly   Complete by: As directed    Lifting restrictions   Complete by: As directed    10 pounds     Allergies as of 12/14/2020      Reactions   Lisinopril Cough      Medication List    STOP taking these medications   clopidogrel 75 MG tablet Commonly known as: PLAVIX     TAKE these medications   amLODipine 5 MG tablet Commonly known as: NORVASC Take 5 mg by mouth daily.   amLODipine 10 MG tablet Commonly known as: NORVASC Take 1 tablet (  10 mg total) by mouth daily.   aspirin EC 81 MG tablet Take 81 mg by mouth daily.   atorvastatin 80 MG tablet Commonly known as: LIPITOR TAKE 1 TABLET BY MOUTH DAILY   clonazePAM 0.5 MG tablet Commonly known as: KLONOPIN Take 0.5 mg by mouth daily as needed for anxiety.   docusate sodium 100 MG capsule Commonly known as: COLACE Take 100 mg by mouth daily.   donepezil 23 MG Tabs  tablet Commonly known as: ARICEPT Take 23 mg by mouth daily.   METAMUCIL FIBER PO Take 1 Scoop by mouth 2 (two) times daily.   metoprolol tartrate 50 MG tablet Commonly known as: LOPRESSOR TAKE 1 TABLET BY MOUTH TWICE DAILY   omeprazole 40 MG capsule Commonly known as: PRILOSEC Take 1 capsule (40 mg total) by mouth in the morning and at bedtime. What changed: when to take this   potassium chloride 10 MEQ CR capsule Commonly known as: MICRO-K Take 10 mEq by mouth daily.   tamsulosin 0.4 MG Caps capsule Commonly known as: FLOMAX Take 0.4 mg by mouth daily after breakfast.   traMADol 50 MG tablet Commonly known as: ULTRAM Take 1 tablet (50 mg total) by mouth every 6 (six) hours as needed for severe pain.            Discharge Care Instructions  (From admission, onward)         Start     Ordered   12/14/20 0000  Discharge wound care:       Comments: Ok to shower over incision. Gently pat dry when done. Do not bathe, swim or submerge incision underwater.   12/14/20 0809          Follow-up Information    [facility] On 12/22/2019.   Why: 9AM Contact information: [place] [address] [phone]              Signed: Dwan Bolt 12/15/2020, 7:41 AM

## 2021-01-06 DIAGNOSIS — D49512 Neoplasm of unspecified behavior of left kidney: Secondary | ICD-10-CM | POA: Diagnosis not present

## 2021-03-01 DIAGNOSIS — R7303 Prediabetes: Secondary | ICD-10-CM | POA: Diagnosis not present

## 2021-03-01 DIAGNOSIS — K219 Gastro-esophageal reflux disease without esophagitis: Secondary | ICD-10-CM | POA: Diagnosis not present

## 2021-03-01 DIAGNOSIS — I2510 Atherosclerotic heart disease of native coronary artery without angina pectoris: Secondary | ICD-10-CM | POA: Diagnosis not present

## 2021-03-01 DIAGNOSIS — Z0000 Encounter for general adult medical examination without abnormal findings: Secondary | ICD-10-CM | POA: Diagnosis not present

## 2021-03-01 DIAGNOSIS — E782 Mixed hyperlipidemia: Secondary | ICD-10-CM | POA: Diagnosis not present

## 2021-03-01 DIAGNOSIS — D49512 Neoplasm of unspecified behavior of left kidney: Secondary | ICD-10-CM | POA: Diagnosis not present

## 2021-03-01 DIAGNOSIS — I10 Essential (primary) hypertension: Secondary | ICD-10-CM | POA: Diagnosis not present

## 2021-03-07 DIAGNOSIS — Z85038 Personal history of other malignant neoplasm of large intestine: Secondary | ICD-10-CM | POA: Diagnosis not present

## 2021-03-07 DIAGNOSIS — K6389 Other specified diseases of intestine: Secondary | ICD-10-CM | POA: Diagnosis not present

## 2021-03-07 DIAGNOSIS — Z85528 Personal history of other malignant neoplasm of kidney: Secondary | ICD-10-CM | POA: Diagnosis not present

## 2021-03-07 DIAGNOSIS — K5730 Diverticulosis of large intestine without perforation or abscess without bleeding: Secondary | ICD-10-CM | POA: Diagnosis not present

## 2021-03-07 DIAGNOSIS — D49512 Neoplasm of unspecified behavior of left kidney: Secondary | ICD-10-CM | POA: Diagnosis not present

## 2021-03-08 DIAGNOSIS — M150 Primary generalized (osteo)arthritis: Secondary | ICD-10-CM | POA: Diagnosis not present

## 2021-03-08 DIAGNOSIS — I10 Essential (primary) hypertension: Secondary | ICD-10-CM | POA: Diagnosis not present

## 2021-03-08 DIAGNOSIS — D49512 Neoplasm of unspecified behavior of left kidney: Secondary | ICD-10-CM | POA: Diagnosis not present

## 2021-03-08 DIAGNOSIS — E782 Mixed hyperlipidemia: Secondary | ICD-10-CM | POA: Diagnosis not present

## 2021-03-08 DIAGNOSIS — I2510 Atherosclerotic heart disease of native coronary artery without angina pectoris: Secondary | ICD-10-CM | POA: Diagnosis not present

## 2021-04-03 ENCOUNTER — Other Ambulatory Visit: Payer: Self-pay | Admitting: Cardiology

## 2021-04-06 DIAGNOSIS — D132 Benign neoplasm of duodenum: Secondary | ICD-10-CM | POA: Diagnosis not present

## 2021-05-12 ENCOUNTER — Ambulatory Visit: Payer: Medicare Other | Admitting: Cardiology

## 2021-05-23 DIAGNOSIS — K317 Polyp of stomach and duodenum: Secondary | ICD-10-CM | POA: Diagnosis not present

## 2021-05-25 ENCOUNTER — Other Ambulatory Visit: Payer: Self-pay | Admitting: Gastroenterology

## 2021-05-26 ENCOUNTER — Other Ambulatory Visit: Payer: Self-pay

## 2021-05-26 ENCOUNTER — Encounter (HOSPITAL_COMMUNITY): Payer: Self-pay | Admitting: Gastroenterology

## 2021-05-31 ENCOUNTER — Encounter (HOSPITAL_COMMUNITY): Payer: Self-pay | Admitting: Gastroenterology

## 2021-05-31 ENCOUNTER — Ambulatory Visit (HOSPITAL_COMMUNITY): Payer: Medicare Other | Admitting: Certified Registered"

## 2021-05-31 ENCOUNTER — Other Ambulatory Visit: Payer: Self-pay

## 2021-05-31 ENCOUNTER — Ambulatory Visit (HOSPITAL_COMMUNITY)
Admission: RE | Admit: 2021-05-31 | Discharge: 2021-05-31 | Disposition: A | Discharge status: Home / Self Care | Payer: Medicare Other | Attending: Gastroenterology | Admitting: Gastroenterology

## 2021-05-31 ENCOUNTER — Encounter (HOSPITAL_COMMUNITY)
Admission: RE | Disposition: A | Discharge status: Home / Self Care | Payer: Self-pay | Source: Home / Self Care | Attending: Gastroenterology

## 2021-05-31 DIAGNOSIS — Z7982 Long term (current) use of aspirin: Secondary | ICD-10-CM | POA: Insufficient documentation

## 2021-05-31 DIAGNOSIS — K449 Diaphragmatic hernia without obstruction or gangrene: Secondary | ICD-10-CM | POA: Diagnosis not present

## 2021-05-31 DIAGNOSIS — Z8719 Personal history of other diseases of the digestive system: Secondary | ICD-10-CM | POA: Insufficient documentation

## 2021-05-31 DIAGNOSIS — Z09 Encounter for follow-up examination after completed treatment for conditions other than malignant neoplasm: Secondary | ICD-10-CM | POA: Insufficient documentation

## 2021-05-31 DIAGNOSIS — Z7902 Long term (current) use of antithrombotics/antiplatelets: Secondary | ICD-10-CM | POA: Diagnosis not present

## 2021-05-31 DIAGNOSIS — K317 Polyp of stomach and duodenum: Secondary | ICD-10-CM | POA: Diagnosis not present

## 2021-05-31 DIAGNOSIS — G4733 Obstructive sleep apnea (adult) (pediatric): Secondary | ICD-10-CM | POA: Diagnosis not present

## 2021-05-31 DIAGNOSIS — Z85038 Personal history of other malignant neoplasm of large intestine: Secondary | ICD-10-CM | POA: Insufficient documentation

## 2021-05-31 DIAGNOSIS — Z79899 Other long term (current) drug therapy: Secondary | ICD-10-CM | POA: Insufficient documentation

## 2021-05-31 DIAGNOSIS — Z9989 Dependence on other enabling machines and devices: Secondary | ICD-10-CM | POA: Diagnosis not present

## 2021-05-31 HISTORY — PX: ESOPHAGOGASTRODUODENOSCOPY (EGD) WITH PROPOFOL: SHX5813

## 2021-05-31 HISTORY — DX: Prediabetes: R73.03

## 2021-05-31 HISTORY — PX: POLYPECTOMY: SHX5525

## 2021-05-31 SURGERY — ESOPHAGOGASTRODUODENOSCOPY (EGD) WITH PROPOFOL
Anesthesia: General

## 2021-05-31 MED ORDER — PHENYLEPHRINE HCL-NACL 10-0.9 MG/250ML-% IV SOLN
INTRAVENOUS | Status: DC | PRN
  Administered 2021-05-31: 50 ug/min via INTRAVENOUS

## 2021-05-31 MED ORDER — FENTANYL CITRATE (PF) 100 MCG/2ML IJ SOLN
INTRAMUSCULAR | Status: AC
  Filled 2021-05-31: qty 2

## 2021-05-31 MED ORDER — SUCCINYLCHOLINE CHLORIDE 200 MG/10ML IV SOSY
PREFILLED_SYRINGE | INTRAVENOUS | Status: DC | PRN
  Administered 2021-05-31: 120 mg via INTRAVENOUS

## 2021-05-31 MED ORDER — FENTANYL CITRATE (PF) 100 MCG/2ML IJ SOLN
INTRAMUSCULAR | Status: DC | PRN
  Administered 2021-05-31: 50 ug via INTRAVENOUS

## 2021-05-31 MED ORDER — PHENYLEPHRINE HCL (PRESSORS) 10 MG/ML IV SOLN
INTRAVENOUS | Status: AC
  Filled 2021-05-31: qty 1

## 2021-05-31 MED ORDER — ONDANSETRON HCL 4 MG/2ML IJ SOLN
INTRAMUSCULAR | Status: DC | PRN
  Administered 2021-05-31: 4 mg via INTRAVENOUS

## 2021-05-31 MED ORDER — LIDOCAINE 2% (20 MG/ML) 5 ML SYRINGE
INTRAMUSCULAR | Status: DC | PRN
  Administered 2021-05-31: 60 mg via INTRAVENOUS

## 2021-05-31 MED ORDER — PROPOFOL 10 MG/ML IV BOLUS
INTRAVENOUS | Status: DC | PRN
  Administered 2021-05-31: 200 mg via INTRAVENOUS

## 2021-05-31 MED ORDER — EPHEDRINE SULFATE-NACL 50-0.9 MG/10ML-% IV SOSY
PREFILLED_SYRINGE | INTRAVENOUS | Status: DC | PRN
  Administered 2021-05-31: 5 mg via INTRAVENOUS
  Administered 2021-05-31: 15 mg via INTRAVENOUS
  Administered 2021-05-31: 10 mg via INTRAVENOUS

## 2021-05-31 MED ORDER — SODIUM CHLORIDE 0.9 % IV SOLN: INTRAVENOUS | Status: DC

## 2021-05-31 MED ORDER — VASOPRESSIN 20 UNIT/ML IV SOLN
INTRAVENOUS | Status: AC
  Filled 2021-05-31: qty 1

## 2021-05-31 MED ORDER — DEXAMETHASONE SODIUM PHOSPHATE 10 MG/ML IJ SOLN
INTRAMUSCULAR | Status: DC | PRN
  Administered 2021-05-31: 4 mg via INTRAVENOUS

## 2021-05-31 MED ORDER — PROPOFOL 10 MG/ML IV BOLUS
INTRAVENOUS | Status: AC
  Filled 2021-05-31: qty 20

## 2021-05-31 MED ORDER — PHENYLEPHRINE 40 MCG/ML (10ML) SYRINGE FOR IV PUSH (FOR BLOOD PRESSURE SUPPORT)
PREFILLED_SYRINGE | INTRAVENOUS | Status: DC | PRN
  Administered 2021-05-31: 80 ug via INTRAVENOUS
  Administered 2021-05-31: 120 ug via INTRAVENOUS
  Administered 2021-05-31: 80 ug via INTRAVENOUS

## 2021-05-31 SURGICAL SUPPLY — 15 items

## 2021-05-31 NOTE — Anesthesia Preprocedure Evaluation (Addendum)
Anesthesia Evaluation  Patient identified by MRN, date of birth, ID band Patient awake    Reviewed: Allergy & Precautions, NPO status , Patient's Chart, lab work & pertinent test results  Airway Mallampati: III  TM Distance: >3 FB Neck ROM: Full    Dental  (+) Teeth Intact   Pulmonary sleep apnea and Continuous Positive Airway Pressure Ventilation ,    Pulmonary exam normal        Cardiovascular hypertension, Pt. on medications and Pt. on home beta blockers + CAD   Rhythm:Regular Rate:Normal     Neuro/Psych Anxiety Dementia negative neurological ROS     GI/Hepatic Neg liver ROS, GERD  ,  Endo/Other  negative endocrine ROS  Renal/GU negative Renal ROS  negative genitourinary   Musculoskeletal negative musculoskeletal ROS (+)   Abdominal (+)  Abdomen: soft. Bowel sounds: normal.  Peds  Hematology negative hematology ROS (+)   Anesthesia Other Findings   Reproductive/Obstetrics                             Anesthesia Physical Anesthesia Plan  ASA: 3  Anesthesia Plan: General   Post-op Pain Management:    Induction: Intravenous and Rapid sequence  PONV Risk Score and Plan: 2 and Ondansetron, Dexamethasone and Treatment may vary due to age or medical condition  Airway Management Planned: Mask and Oral ETT  Additional Equipment: None  Intra-op Plan:   Post-operative Plan: Extubation in OR  Informed Consent: I have reviewed the patients History and Physical, chart, labs and discussed the procedure including the risks, benefits and alternatives for the proposed anesthesia with the patient or authorized representative who has indicated his/her understanding and acceptance.     Dental advisory given  Plan Discussed with:   Anesthesia Plan Comments: (- GA per surgeon request. Done with prior procedures as well  Echocardiogram 11/11/2020:  Left ventricle cavity is normal in size  and wall thickness. Normal global  wall motion. Normal LV systolic function with EF 55%. Doppler evidence of  grade I (impaired) diastolic dysfunction, normal LAP.  Structurally normal trileaflet aortic valve. Mild (Grade I) aortic  regurgitation.  No evidence of pulmonary hypertension.  Unlike previous study in 2019, pulmonary hypertension not seen on this  study. )       Anesthesia Quick Evaluation

## 2021-05-31 NOTE — Transfer of Care (Signed)
Immediate Anesthesia Transfer of Care Note  Patient: Justin Davidson  Procedure(s) Performed: ESOPHAGOGASTRODUODENOSCOPY (EGD) WITH PROPOFOL POLYPECTOMY  Patient Location: PACU  Anesthesia Type:General  Level of Consciousness: awake, alert  and oriented  Airway & Oxygen Therapy: Patient Spontanous Breathing and Patient connected to face mask oxygen  Post-op Assessment: Report given to RN, Post -op Vital signs reviewed and stable, Patient moving all extremities X 4 and Patient able to stick tongue midline  Post vital signs: Reviewed and stable  Last Vitals:  Vitals Value Taken Time  BP 137/109 05/31/21 1442  Temp    Pulse 69 05/31/21 1443  Resp 12 05/31/21 1443  SpO2 97 % 05/31/21 1443  Vitals shown include unvalidated device data.  Last Pain:  Vitals:   05/31/21 1442  TempSrc:   PainSc: 0-No pain         Complications: No notable events documented.

## 2021-05-31 NOTE — Anesthesia Procedure Notes (Signed)
Procedure Name: Intubation Date/Time: 05/31/2021 1:55 PM Performed by: Niel Hummer, CRNA Pre-anesthesia Checklist: Patient identified, Emergency Drugs available, Suction available and Patient being monitored Patient Re-evaluated:Patient Re-evaluated prior to induction Oxygen Delivery Method: Circle system utilized Preoxygenation: Pre-oxygenation with 100% oxygen Induction Type: IV induction and Rapid sequence Laryngoscope Size: Mac and 4 Grade View: Grade II Tube type: Oral Tube size: 7.5 mm Number of attempts: 1 Airway Equipment and Method: Stylet Placement Confirmation: ETT inserted through vocal cords under direct vision, positive ETCO2 and breath sounds checked- equal and bilateral Secured at: 24 cm Tube secured with: Tape Dental Injury: Teeth and Oropharynx as per pre-operative assessment

## 2021-05-31 NOTE — Op Note (Signed)
[facility] Patient Name: Justin Davidson Procedure Date: 05/31/2021 MRN: [id number] Attending MD: Clarene Essex , MD Date of Birth: Dec 26, 1945 CSN: [id number] Age: 75 Admit Type: Outpatient Procedure:                Upper GI endoscopy Indications:              Follow-up of polyps in the duodenum Providers:                Clarene Essex, MD, Dulcy Fanny, Clae Houle, Cletis Athens, Technician Referring MD:              Medicines:                General Anesthesia Complications:            No immediate complications. Estimated Blood Loss:     Estimated blood loss: none. Procedure:                Pre-Anesthesia Assessment:                           - Prior to the procedure, a History and Physical                            was performed, and patient medications and                            allergies were reviewed. The patient's tolerance of                            previous anesthesia was also reviewed. The risks                            and benefits of the procedure and the sedation                            options and risks were discussed with the patient.                            All questions were answered, and informed consent                            was obtained. Prior Anticoagulants: The patient has                            taken no previous anticoagulant or antiplatelet                            agents except for aspirin. ASA Grade Assessment:                            III - A patient with severe systemic disease. After  reviewing the risks and benefits, the patient was                            deemed in satisfactory condition to undergo the                            procedure.                           After obtaining informed consent, the endoscope was                            passed under direct vision. Throughout the                            procedure, the patient's blood pressure, pulse,  and                            oxygen saturations were monitored continuously. The                            GIF-H190 ([id number]) Olympus gastroscope was                            introduced through the mouth, and advanced to the                            third part of duodenum. The upper GI endoscopy was                            accomplished without difficulty. The patient                            tolerated the procedure well. We proceeded with the                            snare using the regular EGD scope and then we                            switched to the ERCP scope and biopsies of the                            ampulla and pictures 1213 and 14 were obtained and                            additional biopsies of the duodenum were obtained Scope In: Scope Out: Findings:      A small hiatal hernia was present.      The entire examined stomach was normal.      A single medium-sized semi-sessile polyp was found in the second portion       of the duodenum. The polyp was removed with a hot snare. Polyp resection       was ?incomplete. The resected tissue was retrieved. Biopsies were taken  with a cold forceps for histology. And these were all put in a container       #1 including a few biopsies from a second area not involving the ampulla      A single medium-sized semi-sessile polyp with no bleeding was found in       the ampulla. Biopsies were taken with a cold forceps for histology.       These biopsies were obtained container #2      The duodenal bulb and third portion of the duodenum were normal.      The exam was otherwise without abnormality.      There was evidence of a Surgical closure in the second portion of the       duodenum. This was characterized by healthy appearing mucosa. Impression:               - Small hiatal hernia.                           - Normal stomach.                           - A single duodenal polyp. Incomplete resection.                             Resected tissue retrieved. Biopsied.                           - A single duodenal polyp. Biopsied.                           - Normal duodenal bulb and third portion of the                            duodenum.                           - The examination was otherwise normal.                           - Surgical closure, characterized by healthy                            appearing mucosa was found. Moderate Sedation:      Not Applicable - Patient had care per Anesthesia. Recommendation:           - Patient has a contact number available for                            emergencies. The signs and symptoms of potential                            delayed complications were discussed with the                            patient. Return to normal activities tomorrow.  Written discharge instructions were provided to the                            patient.                           - Soft diet today.                           - Continue present medications.                           - Await pathology results.                           - Return to GI clinic PRN.                           - Telephone GI clinic for pathology results in 1                            week.                           - Telephone GI clinic if symptomatic PRN.                           - Repeat upper endoscopy after studies are complete                            for surveillance based on pathology results. Procedure Code(s):        --- Professional ---                           [phone], Esophagogastroduodenoscopy, flexible,                            transoral; with removal of tumor(s), polyp(s), or                            other lesion(s) by snare technique                           43239, 69, Esophagogastroduodenoscopy, flexible,                            transoral; with biopsy, single or multiple Diagnosis Code(s):        --- Professional ---                           K44.9,  Diaphragmatic hernia without obstruction or                            gangrene                           K31.7, Polyp of stomach and duodenum CPT copyright 2019 [employer]  Association. All rights reserved. The codes documented in this report are preliminary and upon coder review may  be revised to meet current compliance requirements. Clarene Essex, MD 05/31/2021 2:37:33 PM This report has been signed electronically. Number of Addenda: 0

## 2021-05-31 NOTE — Progress Notes (Signed)
Justin Davidson 1:25 PM  Subjective: Patient without any new problems since she was seen recently in the office  Objective: Vital signs stable afebrile no acute distress exam please see preassessment evaluation  Assessment: Ampullary adenoma  Plan: Okay to proceed with endoscopy to reevaluate his duodenum with anesthesia assistance  [facility]  office [phone] After 5PM or if no answer call [phone]

## 2021-05-31 NOTE — Discharge Instructions (Addendum)
Call if question or problem otherwise call for biopsy report in 1 week and follow-up as needed and will discuss repeat endoscopy based on biopsies and otherwise may have soft solids first meal today and slowly advance as tolerated and okay to try probiotics for gas and okay to use Gas-X as well  YOU HAD AN ENDOSCOPIC PROCEDURE TODAY: Refer to the procedure report and other information in the discharge instructions given to you for any specific questions about what was found during the examination. If this information does not answer your questions, please call [facility] office at [phone] to clarify.   YOU SHOULD EXPECT: Some feelings of bloating in the abdomen. Passage of more gas than usual. Walking can help get rid of the air that was put into your GI tract during the procedure and reduce the bloating. If you had a lower endoscopy (such as a colonoscopy or flexible sigmoidoscopy) you may notice spotting of blood in your stool or on the toilet paper. Some abdominal soreness may be present for a day or two, also.  DIET: Your first meal following the procedure should be a light meal and then it is ok to progress to your normal diet. A half-sandwich or bowl of soup is an example of a good first meal. Heavy or fried foods are harder to digest and may make you feel nauseous or bloated. Drink plenty of fluids but you should avoid alcoholic beverages for 24 hours. If you had a esophageal dilation, please see attached instructions for diet.    ACTIVITY: Your care partner should take you home directly after the procedure. You should plan to take it easy, moving slowly for the rest of the day. You can resume normal activity the day after the procedure however YOU SHOULD NOT DRIVE, use power tools, machinery or perform tasks that involve climbing or major physical exertion for 24 hours (because of the sedation medicines used during the test).   SYMPTOMS TO REPORT IMMEDIATELY: A gastroenterologist can be reached  at any hour. Please call [phone]  for any of the following symptoms:  Following upper endoscopy (EGD, EUS, ERCP, esophageal dilation) Vomiting of blood or coffee ground material  New, significant abdominal pain  New, significant chest pain or pain under the shoulder blades  Painful or persistently difficult swallowing  New shortness of breath  Black, tarry-looking or red, bloody stools  FOLLOW UP:  If any biopsies were taken you will be contacted by phone or by letter within the next 1-3 weeks. Call [phone]  if you have not heard about the biopsies in 3 weeks.  Please also call with any specific questions about appointments or follow up tests. YOU HAD AN ENDOSCOPIC PROCEDURE TODAY: Refer to the procedure report and other information in the discharge instructions given to you for any specific questions about what was found during the examination. If this information does not answer your questions, please call [facility] office at [phone] to clarify.   YOU SHOULD EXPECT: Some feelings of bloating in the abdomen. Passage of more gas than usual. Walking can help get rid of the air that was put into your GI tract during the procedure and reduce the bloating. If you had a lower endoscopy (such as a colonoscopy or flexible sigmoidoscopy) you may notice spotting of blood in your stool or on the toilet paper. Some abdominal soreness may be present for a day or two, also.  DIET: Your first meal following the procedure should be a light meal and  then it is ok to progress to your normal diet. A half-sandwich or bowl of soup is an example of a good first meal. Heavy or fried foods are harder to digest and may make you feel nauseous or bloated. Drink plenty of fluids but you should avoid alcoholic beverages for 24 hours. If you had a esophageal dilation, please see attached instructions for diet.    ACTIVITY: Your care partner should take you home directly after the procedure. You should plan to take  it easy, moving slowly for the rest of the day. You can resume normal activity the day after the procedure however YOU SHOULD NOT DRIVE, use power tools, machinery or perform tasks that involve climbing or major physical exertion for 24 hours (because of the sedation medicines used during the test).   SYMPTOMS TO REPORT IMMEDIATELY: A gastroenterologist can be reached at any hour. Please call [phone]  for any of the following symptoms:  Following upper endoscopy (EGD, EUS, ERCP, esophageal dilation) Vomiting of blood or coffee ground material  New, significant abdominal pain  New, significant chest pain or pain under the shoulder blades  Painful or persistently difficult swallowing  New shortness of breath  Black, tarry-looking or red, bloody stools  FOLLOW UP:  If any biopsies were taken you will be contacted by phone or by letter within the next 1-3 weeks. Call [phone]  if you have not heard about the biopsies in 3 weeks.  Please also call with any specific questions about appointments or follow up tests.

## 2021-06-01 NOTE — Anesthesia Postprocedure Evaluation (Signed)
Anesthesia Post Note  Patient: Justin Davidson  Procedure(s) Performed: ESOPHAGOGASTRODUODENOSCOPY (EGD) WITH PROPOFOL POLYPECTOMY     Patient location during evaluation: Endoscopy Anesthesia Type: General Level of consciousness: awake and alert Pain management: pain level controlled Vital Signs Assessment: post-procedure vital signs reviewed and stable Respiratory status: spontaneous breathing, nonlabored ventilation, respiratory function stable and patient connected to nasal cannula oxygen Cardiovascular status: blood pressure returned to baseline and stable Postop Assessment: no apparent nausea or vomiting Anesthetic complications: no   No notable events documented.  Last Vitals:  Vitals:   05/31/21 1455 05/31/21 1512  BP: (!) 165/70 (!) 177/85  Pulse: 63 61  Resp: 19 15  Temp: (!) 35.9 C   SpO2: 96% 99%    Last Pain:  Vitals:   05/31/21 1512  TempSrc:   PainSc: 0-No pain                 Catalina Gravel

## 2021-06-02 ENCOUNTER — Encounter (HOSPITAL_COMMUNITY): Payer: Self-pay | Admitting: Gastroenterology

## 2021-06-02 LAB — SURGICAL PATHOLOGY

## 2021-06-21 ENCOUNTER — Encounter: Payer: Self-pay | Admitting: Cardiology

## 2021-06-21 ENCOUNTER — Other Ambulatory Visit: Payer: Self-pay

## 2021-06-21 ENCOUNTER — Ambulatory Visit: Payer: Medicare Other | Admitting: Cardiology

## 2021-06-21 VITALS — BP 135/75 | HR 64 | Temp 98.0°F | Resp 12 | Ht 70.5 in | Wt 301.4 lb

## 2021-06-21 DIAGNOSIS — E7800 Pure hypercholesterolemia, unspecified: Secondary | ICD-10-CM

## 2021-06-21 DIAGNOSIS — G4733 Obstructive sleep apnea (adult) (pediatric): Secondary | ICD-10-CM | POA: Diagnosis not present

## 2021-06-21 DIAGNOSIS — R0989 Other specified symptoms and signs involving the circulatory and respiratory systems: Secondary | ICD-10-CM

## 2021-06-21 DIAGNOSIS — I2510 Atherosclerotic heart disease of native coronary artery without angina pectoris: Secondary | ICD-10-CM | POA: Diagnosis not present

## 2021-06-21 DIAGNOSIS — Z905 Acquired absence of kidney: Secondary | ICD-10-CM

## 2021-06-21 DIAGNOSIS — I10 Essential (primary) hypertension: Secondary | ICD-10-CM

## 2021-06-21 MED ORDER — LOSARTAN POTASSIUM 25 MG PO TABS: 25.0000 mg | ORAL_TABLET | Freq: Every day | ORAL | 3 refills | Status: DC

## 2021-06-21 NOTE — Progress Notes (Signed)
Primary Physician/Referring:  Merrilee Seashore, MD  Patient ID: Justin Davidson, male    DOB: 1946/04/06, 75 y.o.   MRN: [id number]  Chief Complaint  Patient presents with   Follow-up    6 month   Coronary Artery Disease   Hypertension   HPI:    Justin Davidson  is a 75 y.o. AAM with coronary artery disease status post left circumflex and ramus intermedius PCI in 2011, residual moderate disease in mid LAD, remote history of colon cancer status post colon resection and chemotherapy, hypertension, hyperlipidemia, severe objective sleep apnea on CPAP but has discontinued using CPAP few months ago.  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  Presently doing well and except for chronic dyspnea, no other symptoms.  He has not used any sublingual nitroglycerin and has not had any recurrence of chest pain.  Denies leg edema, PND or orthopnea.  Past Medical History:  Diagnosis Date   Anxiety    Colon cancer ([place])    Colon polyps    Coronary artery disease    Dementia (HCC)    Dizziness and giddiness    GERD (gastroesophageal reflux disease)    Hypercholesteremia    Hyperglycemia    Hypertension    OSA (obstructive sleep apnea)    CPAP    Pre-diabetes    Prediabetes    diet controlled   Past Surgical History:  Procedure Laterality Date   ADRENALECTOMY Left 12/07/2020   Procedure: LEFT ADRENALECTOMY;  Surgeon: Janith Lima, MD;  Location: [place];  Service: Urology;  Laterality: Left;   BIOPSY  09/13/2020   Procedure: BIOPSY;  Surgeon: Rush Landmark Telford Nab., MD;  Location: [place];  Service: Gastroenterology;;   cardaic stents     CARDIAC CATHETERIZATION  10/28/2010   CHOLECYSTECTOMY N/A 12/07/2020   Procedure: CHOLECYSTECTOMY;  Surgeon: Dwan Bolt, MD;  Location: [place];  Service: General;  Laterality: N/A;   COLONOSCOPY     ESOPHAGOGASTRODUODENOSCOPY (EGD) WITH PROPOFOL N/A 04/19/2020   Procedure: ESOPHAGOGASTRODUODENOSCOPY (EGD) WITH PROPOFOL;  Surgeon: Clarene Essex, MD;  Location: [facility];  Service: Endoscopy;  Laterality: N/A;   ESOPHAGOGASTRODUODENOSCOPY (EGD) WITH PROPOFOL N/A 09/13/2020   Procedure: ESOPHAGOGASTRODUODENOSCOPY (EGD) WITH PROPOFOL;  Surgeon: Rush Landmark Telford Nab., MD;  Location: [place];  Service: Gastroenterology;  Laterality: N/A;   ESOPHAGOGASTRODUODENOSCOPY (EGD) WITH PROPOFOL N/A 05/31/2021   Procedure: ESOPHAGOGASTRODUODENOSCOPY (EGD) WITH PROPOFOL;  Surgeon: Clarene Essex, MD;  Location: [facility];  Service: Endoscopy;  Laterality: N/A;  please have side viewing scope available   EUS  09/13/2020   Procedure: UPPER ENDOSCOPIC ULTRASOUND (EUS) RADIAL;  Surgeon: Irving Copas., MD;  Location: [facility];  Service: Gastroenterology;;   HOT HEMOSTASIS N/A 04/19/2020   Procedure: HOT HEMOSTASIS (ARGON PLASMA COAGULATION/BICAP);  Surgeon: Clarene Essex, MD;  Location: [facility];  Service: Endoscopy;  Laterality: N/A;   NEPHRECTOMY Left 12/07/2020   Procedure: OPEN LEFT RADICAL NEPHRECTOMY;  Surgeon: Janith Lima, MD;  Location: [facility];  Service: Urology;  Laterality: Left;   PARTIAL COLECTOMY  1996   colon cancer   POLYPECTOMY  05/31/2021   Procedure: POLYPECTOMY;  Surgeon: Clarene Essex, MD;  Location: [facility];  Service: Endoscopy;;   WHIPPLE PROCEDURE N/A 12/07/2020   Procedure: OPEN TRANSDUODENAL AMPULLECTOMY;  Surgeon: Dwan Bolt, MD;  Location:  [facility];  Service: General;  Laterality: N/A;   Social History   Tobacco Use   Smoking status: Never   Smokeless tobacco: Never  Substance Use Topics   Alcohol use: Yes    Comment: occasional  Marital Status: Married    ROS  Review of Systems  Cardiovascular:  Positive for dyspnea on exertion (stable). Negative for leg swelling  and syncope.  Respiratory:  Positive for sleep disturbances due to breathing and snoring (severe sleep apnea and uses CPAP regularly).   Musculoskeletal:  Negative for joint swelling.  Gastrointestinal:  Negative for anorexia, change in bowel habit, hematochezia and melena.  Genitourinary:  Positive for decreased libido (ED).  All other systems reviewed and are negative. Objective   Vitals with BMI 06/21/2021 05/31/2021 05/31/2021  Height 5' 10.5" - -  Weight 301 lbs 6 oz - -  BMI 16.10 - -  Systolic 960 454 098  Diastolic 75 85 70  Pulse 64 61 63      Physical Exam Constitutional:      General: He is not in acute distress.    Appearance: He is well-developed.     Comments: Morbidly obese  HENT:     Head: Atraumatic.  Eyes:     Conjunctiva/sclera: Conjunctivae normal.  Neck:     Thyroid: No thyromegaly.     Vascular: Carotid bruit (bilateral) present. No JVD.     Comments: Short neck and difficult to evaluate JVP Cardiovascular:     Rate and Rhythm: Normal rate and regular rhythm.     Pulses:          Dorsalis pedis pulses are 2+ on the right side and 2+ on the left side.       Posterior tibial pulses are 2+ on the right side and 2+ on the left side.     Heart sounds: Heart sounds are distant. Murmur heard.  Midsystolic murmur is present with a grade of 2/6 at the upper right sternal border radiating to the neck.    No gallop.     Comments: Femoral and popliteal pulse difficult to feel due to patient's body habitus.  Pulmonary:     Effort: Pulmonary effort is normal.     Breath sounds: Normal breath sounds.  Abdominal:     General: Bowel sounds are normal.     Palpations: Abdomen is soft.     Comments: Obese. Pannus present  Musculoskeletal:        General: Normal range of motion.     Cervical back: Neck supple.  Skin:    General: Skin is warm and dry.     Capillary Refill: Capillary refill takes less than 2 seconds.  Neurological:     Mental Status: He is alert.    Laboratory examination:   Recent Labs    09/13/20 1350 12/01/20 1451 12/10/20 0138 12/11/20 0216 12/12/20 0650  NA 138   < > 142 142 139  K 3.5   < > 3.2* 3.1* 3.2*  CL 104   < > 106 105 102  CO2 23   < > 27 27 25   GLUCOSE 99   < > 110* 98 101*  BUN 17   < > 21 20 17   CREATININE 0.82   < > 1.54* 1.33* 1.27*  CALCIUM 8.6*   < > 8.3* 8.1* 8.0*  GFRNONAA >60   < > 46* 55* 59*  GFRAA >60  --   --   --   --    < > =  values in this interval not displayed.   CrCl cannot be calculated (Patient's most recent lab result is older than the maximum 21 days allowed.).  CMP Latest Ref Rng & Units 12/12/2020 12/11/2020 12/10/2020  Glucose 70 - 99 mg/dL 101(H) 98 110(H)  BUN 8 - 23 mg/dL 17 20 21   Creatinine 0.61 - 1.24 mg/dL 1.27(H) 1.33(H) 1.54(H)  Sodium 135 - 145 mmol/L 139 142 142  Potassium 3.5 - 5.1 mmol/L 3.2(L) 3.1(L) 3.2(L)  Chloride 98 - 111 mmol/L 102 105 106  CO2 22 - 32 mmol/L 25 27 27   Calcium 8.9 - 10.3 mg/dL 8.0(L) 8.1(L) 8.3(L)  Total Protein 6.5 - 8.1 g/dL - 5.6(L) 5.5(L)  Total Bilirubin 0.3 - 1.2 mg/dL - 1.2 1.1  Alkaline Phos 38 - 126 U/L - 60 65  AST 15 - 41 U/L - 31 26  ALT 0 - 44 U/L - 32 28   CBC Latest Ref Rng & Units 12/10/2020 12/09/2020 12/08/2020  WBC 4.0 - 10.5 K/uL 14.9(H) 15.2(H) 12.9(H)  Hemoglobin 13.0 - 17.0 g/dL 10.7(L) 10.5(L) 11.7(L)  Hematocrit 39.0 - 52.0 % 32.1(L) 31.6(L) 35.9(L)  Platelets 150 - 400 K/uL 211 207 224    External labs:  Cholesterol, total 131.000 M 03/01/2021 HDL 35.000 MG 03/01/2021 LDL 74.000 MG 03/01/2021 Triglycerides 122.000 M 03/01/2021  A1C 5.500 % 03/01/2021  Medications and allergies   Allergies  Allergen Reactions   Lisinopril Cough    Current Outpatient Medications on File Prior to Visit  Medication Sig Dispense Refill   amLODipine (NORVASC) 10 MG tablet Take 1 tablet (10 mg total) by mouth daily. 90 tablet 3   aspirin EC 81 MG tablet Take 81 mg by mouth in the morning.     atorvastatin (LIPITOR) 80 MG  tablet TAKE 1 TABLET BY MOUTH DAILY (Patient taking differently: Take 80 mg by mouth in the morning.) 90 tablet 3   clonazePAM (KLONOPIN) 0.5 MG tablet Take 0.5 mg by mouth daily as needed for anxiety (stress).     docusate sodium (COLACE) 100 MG capsule Take 100 mg by mouth 2 (two) times daily as needed (constipation).     donepezil (ARICEPT) 23 MG TABS tablet Take 23 mg by mouth in the morning.     metoprolol tartrate (LOPRESSOR) 50 MG tablet TAKE 1 TABLET BY MOUTH TWICE DAILY (Patient taking differently: Take 50 mg by mouth 2 (two) times daily.) 180 tablet 3   potassium chloride (MICRO-K) 10 MEQ CR capsule Take 10 mEq by mouth in the morning.  5   Psyllium (METAMUCIL FIBER PO) Take 1 Scoop by mouth 2 (two) times daily.      tamsulosin (FLOMAX) 0.4 MG CAPS capsule Take 0.4 mg by mouth in the morning.     No current facility-administered medications on file prior to visit.     Radiology:  No results found. Cardiac Studies:   Coronary angiogram 10/28/10:  3.5x23, Promus mid CX and 2.5x23 Promus mid Ramus intermediate (DES). Residual 50% -60% Mid LAD.  Lexiscan (Walking with mod Bruce)Tetrofosmin Stress Test  10/25/2020: Nondiagnostic ECG stress. There is diaphragmatic attenuation noted in the inferior wall. There is a fixed defect in the inferior region from base to the apex without reversibility suggestive of a large scar.  No stress lung uptake. TID is normal. No stress lung uptake.  Overall LV systolic function is normal with regional wall motion abnormality with akinesis in the inferior wall. Stress LV EF: 53%.  No significant change from 04/12/2018. Intermediate risk study.  Echocardiogram 11/11/2020: Left ventricle cavity is normal in size and wall thickness. Normal global wall motion. Normal LV systolic function with EF 55%. Doppler evidence of grade I (impaired) diastolic dysfunction, normal LAP. Structurally normal trileaflet aortic valve.  Mild (Grade I) aortic regurgitation. No  evidence of pulmonary hypertension. Unlike previous study in 2019, pulmonary hypertension not seen on this study.   EKG:  EKG 06/21/2021: Sinus bradycardia at rate of 57 bpm, left atrial enlargement, normal axis, incomplete right bundle branch block.  No evidence of ischemia.  No change from 11/10/2020.  Assessment     ICD-10-CM   1. Coronary artery disease involving native coronary artery of native heart without angina pectoris  I25.10     2. Essential hypertension  I10 EKG 12-Lead    losartan (COZAAR) 25 MG tablet    Basic metabolic panel    3. Palpitations  R00.2     4. Hypercholesteremia  E78.00     5. Obstructive sleep apnea, severe and on CPAP 2019 - Dr.  Rexene Alberts  G47.33     6. H/O left nephrectomy 12/07/2020  Z90.5     7. Bilateral carotid bruits  R09.89 PCV CAROTID DUPLEX (BILATERAL)     Medications Discontinued During This Encounter  Medication Reason   omeprazole (PRILOSEC) 40 MG capsule Error   traMADol (ULTRAM) 50 MG tablet Error   Meds ordered this encounter  Medications   losartan (COZAAR) 25 MG tablet    Sig: Take 1 tablet (25 mg total) by mouth daily.    Dispense:  90 tablet    Refill:  3   Recommendations:    Jarnell Cordaro is a 75 y.o. AAM with coronary artery disease status post left circumflex and ramus intermedius PCI in 2011, residual moderate disease in mid LAD, remote history of colon cancer status post colon resection and chemotherapy, hypertension, hyperlipidemia, severe objective sleep apnea on CPAP but has discontinued using CPAP few months ago.  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  Presently doing well and except for chronic dyspnea, no other symptoms.  I reviewed his external labs, his lipids are now well  controlled.  Blood pressure is also well controlled.  I would like to start back losartan both for cardiovascular disease and for primary hypertension and also for renal protection at 25 mg daily, he will obtain a BMP in 2 weeks.  His wife is present and they acknowledged.  He has faint bilateral carotid artery bruit left is slightly more prominent, will obtain carotid duplex.  Unless I see a significant abnormality or he has new symptoms, I will see him back in a year.  Weight loss again discussed with the patient.  IS presently not using CPAP, I will forward a copy of my office note to Dr. Rexene Alberts.    Justin Prows, MD, [facility] 06/21/2021, 3:44 PM Office: [phone] Pager: [phone]

## 2021-06-30 ENCOUNTER — Ambulatory Visit: Payer: Medicare Other

## 2021-06-30 ENCOUNTER — Other Ambulatory Visit: Payer: Self-pay

## 2021-06-30 DIAGNOSIS — I6523 Occlusion and stenosis of bilateral carotid arteries: Secondary | ICD-10-CM

## 2021-06-30 DIAGNOSIS — R0989 Other specified symptoms and signs involving the circulatory and respiratory systems: Secondary | ICD-10-CM

## 2021-07-03 NOTE — Progress Notes (Signed)
Mild stenosis in bilateral carotid artery, we will recheck in 1 year.

## 2021-07-06 DIAGNOSIS — I10 Essential (primary) hypertension: Secondary | ICD-10-CM | POA: Diagnosis not present

## 2021-07-07 LAB — BASIC METABOLIC PANEL
BUN/Creatinine Ratio: 13 (ref 10–24)
BUN: 17 mg/dL (ref 8–27)
CO2: 26 mmol/L (ref 20–29)
Calcium: 9 mg/dL (ref 8.6–10.2)
Chloride: 105 mmol/L (ref 96–106)
Creatinine, Ser: 1.34 mg/dL — ABNORMAL HIGH (ref 0.76–1.27)
Glucose: 103 mg/dL — ABNORMAL HIGH (ref 65–99)
Potassium: 3.9 mmol/L (ref 3.5–5.2)
Sodium: 142 mmol/L (ref 134–144)
eGFR: 55 mL/min/{1.73_m2} — ABNORMAL LOW (ref >59)

## 2021-07-07 NOTE — Progress Notes (Signed)
Called and spoke to pt, pt voiced understanding.

## 2021-07-10 DIAGNOSIS — I10 Essential (primary) hypertension: Secondary | ICD-10-CM | POA: Diagnosis not present

## 2021-07-10 DIAGNOSIS — E782 Mixed hyperlipidemia: Secondary | ICD-10-CM | POA: Diagnosis not present

## 2021-07-10 DIAGNOSIS — K219 Gastro-esophageal reflux disease without esophagitis: Secondary | ICD-10-CM | POA: Diagnosis not present

## 2021-07-11 NOTE — Progress Notes (Signed)
Called and spoke with patient wife, (verbal from patient), regarding CAD results.

## 2021-08-10 DIAGNOSIS — E782 Mixed hyperlipidemia: Secondary | ICD-10-CM | POA: Diagnosis not present

## 2021-08-10 DIAGNOSIS — K219 Gastro-esophageal reflux disease without esophagitis: Secondary | ICD-10-CM | POA: Diagnosis not present

## 2021-08-10 DIAGNOSIS — I10 Essential (primary) hypertension: Secondary | ICD-10-CM | POA: Diagnosis not present

## 2021-08-30 DIAGNOSIS — R7303 Prediabetes: Secondary | ICD-10-CM | POA: Diagnosis not present

## 2021-08-30 DIAGNOSIS — Z23 Encounter for immunization: Secondary | ICD-10-CM | POA: Diagnosis not present

## 2021-08-30 DIAGNOSIS — Z0000 Encounter for general adult medical examination without abnormal findings: Secondary | ICD-10-CM | POA: Diagnosis not present

## 2021-08-30 DIAGNOSIS — E782 Mixed hyperlipidemia: Secondary | ICD-10-CM | POA: Diagnosis not present

## 2021-08-30 DIAGNOSIS — R5383 Other fatigue: Secondary | ICD-10-CM | POA: Diagnosis not present

## 2021-08-30 DIAGNOSIS — I10 Essential (primary) hypertension: Secondary | ICD-10-CM | POA: Diagnosis not present

## 2021-09-03 ENCOUNTER — Other Ambulatory Visit: Payer: Self-pay | Admitting: Cardiology

## 2021-09-09 DIAGNOSIS — E782 Mixed hyperlipidemia: Secondary | ICD-10-CM | POA: Diagnosis not present

## 2021-09-09 DIAGNOSIS — K219 Gastro-esophageal reflux disease without esophagitis: Secondary | ICD-10-CM | POA: Diagnosis not present

## 2021-09-09 DIAGNOSIS — I10 Essential (primary) hypertension: Secondary | ICD-10-CM | POA: Diagnosis not present

## 2021-09-15 DIAGNOSIS — R7303 Prediabetes: Secondary | ICD-10-CM | POA: Diagnosis not present

## 2021-09-15 DIAGNOSIS — K219 Gastro-esophageal reflux disease without esophagitis: Secondary | ICD-10-CM | POA: Diagnosis not present

## 2021-09-15 DIAGNOSIS — I10 Essential (primary) hypertension: Secondary | ICD-10-CM | POA: Diagnosis not present

## 2021-09-15 DIAGNOSIS — Z0000 Encounter for general adult medical examination without abnormal findings: Secondary | ICD-10-CM | POA: Diagnosis not present

## 2021-09-15 DIAGNOSIS — N182 Chronic kidney disease, stage 2 (mild): Secondary | ICD-10-CM | POA: Diagnosis not present

## 2021-09-15 DIAGNOSIS — E782 Mixed hyperlipidemia: Secondary | ICD-10-CM | POA: Diagnosis not present

## 2021-10-10 DIAGNOSIS — I10 Essential (primary) hypertension: Secondary | ICD-10-CM | POA: Diagnosis not present

## 2021-10-10 DIAGNOSIS — E782 Mixed hyperlipidemia: Secondary | ICD-10-CM | POA: Diagnosis not present

## 2021-10-10 DIAGNOSIS — K219 Gastro-esophageal reflux disease without esophagitis: Secondary | ICD-10-CM | POA: Diagnosis not present

## 2021-10-25 DIAGNOSIS — D135 Benign neoplasm of extrahepatic bile ducts: Secondary | ICD-10-CM | POA: Diagnosis not present

## 2021-11-02 ENCOUNTER — Other Ambulatory Visit: Payer: Self-pay | Admitting: Cardiology

## 2021-11-09 DIAGNOSIS — E782 Mixed hyperlipidemia: Secondary | ICD-10-CM | POA: Diagnosis not present

## 2021-11-09 DIAGNOSIS — K219 Gastro-esophageal reflux disease without esophagitis: Secondary | ICD-10-CM | POA: Diagnosis not present

## 2021-11-09 DIAGNOSIS — I10 Essential (primary) hypertension: Secondary | ICD-10-CM | POA: Diagnosis not present

## 2021-12-01 ENCOUNTER — Other Ambulatory Visit: Payer: Self-pay

## 2021-12-01 ENCOUNTER — Other Ambulatory Visit (HOSPITAL_COMMUNITY): Payer: Self-pay | Admitting: Urology

## 2021-12-01 ENCOUNTER — Ambulatory Visit (HOSPITAL_COMMUNITY)
Admission: RE | Admit: 2021-12-01 | Discharge: 2021-12-01 | Disposition: A | Discharge status: Home / Self Care | Payer: Medicare Other | Source: Ambulatory Visit | Attending: Urology | Admitting: Urology

## 2021-12-01 DIAGNOSIS — Z85528 Personal history of other malignant neoplasm of kidney: Secondary | ICD-10-CM | POA: Insufficient documentation

## 2021-12-01 DIAGNOSIS — I700 Atherosclerosis of aorta: Secondary | ICD-10-CM | POA: Diagnosis not present

## 2021-12-01 DIAGNOSIS — D49512 Neoplasm of unspecified behavior of left kidney: Secondary | ICD-10-CM | POA: Diagnosis not present

## 2021-12-08 DIAGNOSIS — K5730 Diverticulosis of large intestine without perforation or abscess without bleeding: Secondary | ICD-10-CM | POA: Diagnosis not present

## 2021-12-08 DIAGNOSIS — Z85528 Personal history of other malignant neoplasm of kidney: Secondary | ICD-10-CM | POA: Diagnosis not present

## 2021-12-08 DIAGNOSIS — K6389 Other specified diseases of intestine: Secondary | ICD-10-CM | POA: Diagnosis not present

## 2021-12-08 DIAGNOSIS — K3189 Other diseases of stomach and duodenum: Secondary | ICD-10-CM | POA: Diagnosis not present

## 2021-12-09 DIAGNOSIS — E782 Mixed hyperlipidemia: Secondary | ICD-10-CM | POA: Diagnosis not present

## 2021-12-09 DIAGNOSIS — I10 Essential (primary) hypertension: Secondary | ICD-10-CM | POA: Diagnosis not present

## 2021-12-09 DIAGNOSIS — K219 Gastro-esophageal reflux disease without esophagitis: Secondary | ICD-10-CM | POA: Diagnosis not present

## 2021-12-13 DIAGNOSIS — Z85528 Personal history of other malignant neoplasm of kidney: Secondary | ICD-10-CM | POA: Diagnosis not present

## 2022-01-10 DIAGNOSIS — E782 Mixed hyperlipidemia: Secondary | ICD-10-CM | POA: Diagnosis not present

## 2022-01-10 DIAGNOSIS — K219 Gastro-esophageal reflux disease without esophagitis: Secondary | ICD-10-CM | POA: Diagnosis not present

## 2022-01-10 DIAGNOSIS — I10 Essential (primary) hypertension: Secondary | ICD-10-CM | POA: Diagnosis not present

## 2022-01-12 DIAGNOSIS — R7303 Prediabetes: Secondary | ICD-10-CM | POA: Diagnosis not present

## 2022-01-12 DIAGNOSIS — I2510 Atherosclerotic heart disease of native coronary artery without angina pectoris: Secondary | ICD-10-CM | POA: Diagnosis not present

## 2022-01-12 DIAGNOSIS — E782 Mixed hyperlipidemia: Secondary | ICD-10-CM | POA: Diagnosis not present

## 2022-01-19 DIAGNOSIS — N1831 Chronic kidney disease, stage 3a: Secondary | ICD-10-CM | POA: Diagnosis not present

## 2022-01-19 DIAGNOSIS — K219 Gastro-esophageal reflux disease without esophagitis: Secondary | ICD-10-CM | POA: Diagnosis not present

## 2022-01-19 DIAGNOSIS — E782 Mixed hyperlipidemia: Secondary | ICD-10-CM | POA: Diagnosis not present

## 2022-01-19 DIAGNOSIS — I10 Essential (primary) hypertension: Secondary | ICD-10-CM | POA: Diagnosis not present

## 2022-01-19 DIAGNOSIS — R7303 Prediabetes: Secondary | ICD-10-CM | POA: Diagnosis not present

## 2022-02-07 DIAGNOSIS — I10 Essential (primary) hypertension: Secondary | ICD-10-CM | POA: Diagnosis not present

## 2022-02-07 DIAGNOSIS — K219 Gastro-esophageal reflux disease without esophagitis: Secondary | ICD-10-CM | POA: Diagnosis not present

## 2022-02-07 DIAGNOSIS — E782 Mixed hyperlipidemia: Secondary | ICD-10-CM | POA: Diagnosis not present

## 2022-04-09 DIAGNOSIS — E782 Mixed hyperlipidemia: Secondary | ICD-10-CM | POA: Diagnosis not present

## 2022-04-09 DIAGNOSIS — I10 Essential (primary) hypertension: Secondary | ICD-10-CM | POA: Diagnosis not present

## 2022-04-09 DIAGNOSIS — K219 Gastro-esophageal reflux disease without esophagitis: Secondary | ICD-10-CM | POA: Diagnosis not present

## 2022-05-19 DIAGNOSIS — N1831 Chronic kidney disease, stage 3a: Secondary | ICD-10-CM | POA: Diagnosis not present

## 2022-05-19 DIAGNOSIS — R7303 Prediabetes: Secondary | ICD-10-CM | POA: Diagnosis not present

## 2022-05-19 DIAGNOSIS — I10 Essential (primary) hypertension: Secondary | ICD-10-CM | POA: Diagnosis not present

## 2022-05-19 DIAGNOSIS — E782 Mixed hyperlipidemia: Secondary | ICD-10-CM | POA: Diagnosis not present

## 2022-06-01 DIAGNOSIS — I10 Essential (primary) hypertension: Secondary | ICD-10-CM | POA: Diagnosis not present

## 2022-06-01 DIAGNOSIS — K219 Gastro-esophageal reflux disease without esophagitis: Secondary | ICD-10-CM | POA: Diagnosis not present

## 2022-06-01 DIAGNOSIS — E782 Mixed hyperlipidemia: Secondary | ICD-10-CM | POA: Diagnosis not present

## 2022-06-01 DIAGNOSIS — N1831 Chronic kidney disease, stage 3a: Secondary | ICD-10-CM | POA: Diagnosis not present

## 2022-06-01 DIAGNOSIS — R7303 Prediabetes: Secondary | ICD-10-CM | POA: Diagnosis not present

## 2022-06-21 ENCOUNTER — Encounter: Payer: Self-pay | Admitting: Cardiology

## 2022-06-21 ENCOUNTER — Ambulatory Visit: Payer: Medicare Other | Admitting: Cardiology

## 2022-06-21 VITALS — BP 120/63 | HR 54 | Temp 97.7°F | Resp 16 | Ht 70.0 in | Wt 319.0 lb

## 2022-06-21 DIAGNOSIS — I2510 Atherosclerotic heart disease of native coronary artery without angina pectoris: Secondary | ICD-10-CM

## 2022-06-21 DIAGNOSIS — I6523 Occlusion and stenosis of bilateral carotid arteries: Secondary | ICD-10-CM

## 2022-06-21 DIAGNOSIS — I10 Essential (primary) hypertension: Secondary | ICD-10-CM

## 2022-06-21 DIAGNOSIS — E7800 Pure hypercholesterolemia, unspecified: Secondary | ICD-10-CM

## 2022-06-21 DIAGNOSIS — I4891 Unspecified atrial fibrillation: Secondary | ICD-10-CM | POA: Diagnosis not present

## 2022-06-21 MED ORDER — APIXABAN 5 MG PO TABS: 5.0000 mg | ORAL_TABLET | Freq: Two times a day (BID) | ORAL | 2 refills | Status: DC

## 2022-06-21 NOTE — Progress Notes (Signed)
Primary Physician/Referring:  Merrilee Seashore, MD  Patient ID: Justin Davidson, male    DOB: 1946-04-24, 76 y.o.   MRN: [id number]  Chief Complaint  Patient presents with   Coronary Artery Disease   Hypertension   Hyperlipidemia   Follow-up    1 year   HPI:    Justin Davidson  is a 76 y.o. AAM with coronary artery disease status post left circumflex and ramus intermedius PCI in 2011, residual moderate disease in mid LAD, remote history of colon cancer status post colon resection and chemotherapy, benign periampullary adenoma SP transduodenal ampullectomy in 2021 and also left nephrectomy at the same time for primary renal cell carcinoma, hypertension, hyperlipidemia, hyperglycemia, severe objective sleep apnea not on CPAP, asymptomatic mild bilateral carotid artery stenosis presents here for annual visit.  Presently doing well and except for chronic dyspnea, no other symptoms.  He has not used any sublingual nitroglycerin and has not had any recurrence of chest pain.  Denies leg edema, PND or orthopnea.  Past Medical History:  Diagnosis Date   Anxiety    Colon cancer ([place])    Colon polyps    Coronary artery disease    Dementia (HCC)    Dizziness and giddiness    GERD (gastroesophageal reflux disease)    Hypercholesteremia    Hyperglycemia    Hypertension    OSA (obstructive sleep apnea)    CPAP    Pre-diabetes    Prediabetes    diet controlled    Social History   Tobacco Use   Smoking status: Never   Smokeless tobacco: Never  Substance Use Topics   Alcohol use: Yes    Comment: occasional  Marital Status: Married    ROS  Review of Systems  Cardiovascular:  Positive for dyspnea on exertion (stable). Negative for leg swelling and syncope.  Respiratory:  Positive for snoring (severe sleep apnea, not using CPAP).    Objective      06/21/2022   11:02 AM 06/21/2021    2:59 PM 05/31/2021    3:12 PM  Vitals with BMI  Height '5\' 10"'  5' 32.0"   Weight 319 lbs 301 lbs 6  oz   BMI 23.34 35.68   Systolic 616 837 290  Diastolic 63 75 85  Pulse 54 64 61      Physical Exam Constitutional:      Appearance: He is well-developed.     Comments: Morbidly obese  Neck:     Vascular: No JVD.     Comments: Short neck and difficult to evaluate JVP Cardiovascular:     Rate and Rhythm: Normal rate and regular rhythm.     Pulses:          Carotid pulses are  on the right side with bruit and  on the left side with bruit.      Dorsalis pedis pulses are 2+ on the right side and 2+ on the left side.       Posterior tibial pulses are 2+ on the right side and 2+ on the left side.     Heart sounds: Heart sounds are distant. Murmur heard.     Midsystolic murmur is present with a grade of 2/6 at the upper right sternal border radiating to the neck.     No gallop.     Comments: Femoral and popliteal pulse difficult to feel due to patient's body habitus.  Pulmonary:     Effort: Pulmonary effort is normal.     Breath sounds: Normal  breath sounds.  Abdominal:     General: Bowel sounds are normal.     Palpations: Abdomen is soft.     Comments: Obese. Pannus present  Musculoskeletal:     Right lower leg: No edema.     Left lower leg: No edema.    Laboratory examination:   External labs:  Labs 05/20/2022:  A1c 6.3%.  Total cholesterol 145, triglycerides 189, HDL 35, LDL 72.  121, creatinine 1.46, EGFR 53 mL, potassium 3.8.  LFTs normal.  Medications and allergies   Allergies  Allergen Reactions   Lisinopril Cough    Current Outpatient Medications:    amLODipine (NORVASC) 10 MG tablet, Take 1 tablet (10 mg total) by mouth daily., Disp: 90 tablet, Rfl: 3   apixaban (ELIQUIS) 5 MG TABS tablet, Take 1 tablet (5 mg total) by mouth 2 (two) times daily., Disp: 60 tablet, Rfl: 2   atorvastatin (LIPITOR) 80 MG tablet, TAKE 1 TABLET BY MOUTH DAILY, Disp: 90 tablet, Rfl: 3   busPIRone (BUSPAR) 7.5 MG tablet, Take 7.5 mg by mouth as needed., Disp: , Rfl:    clonazePAM  (KLONOPIN) 0.5 MG tablet, Take 0.5 mg by mouth daily as needed for anxiety (stress)., Disp: , Rfl:    docusate sodium (COLACE) 100 MG capsule, Take 100 mg by mouth 2 (two) times daily as needed (constipation)., Disp: , Rfl:    donepezil (ARICEPT) 23 MG TABS tablet, Take 23 mg by mouth in the morning., Disp: , Rfl:    losartan-hydrochlorothiazide (HYZAAR) 50-12.5 MG tablet, Take 1 tablet by mouth daily., Disp: , Rfl:    metoprolol tartrate (LOPRESSOR) 50 MG tablet, TAKE 1 TABLET BY MOUTH TWICE DAILY (Patient taking differently: Take 50 mg by mouth 2 (two) times daily.), Disp: 180 tablet, Rfl: 3   potassium chloride (MICRO-K) 10 MEQ CR capsule, Take 10 mEq by mouth in the morning., Disp: , Rfl: 5   Psyllium (METAMUCIL FIBER PO), Take 1 Scoop by mouth 2 (two) times daily. , Disp: , Rfl:    tamsulosin (FLOMAX) 0.4 MG CAPS capsule, Take 0.4 mg by mouth in the morning., Disp: , Rfl:     Radiology:  No results found. Cardiac Studies:   Coronary angiogram 10/28/10:  3.5x23, Promus mid CX and 2.5x23 Promus mid Ramus intermediate (DES). Residual 50% -60% Mid LAD.  Lexiscan (Walking with mod Bruce)Tetrofosmin Stress Test  10/25/2020: Nondiagnostic ECG stress. There is diaphragmatic attenuation noted in the inferior wall. There is a fixed defect in the inferior region from base to the apex without reversibility suggestive of a large scar.  No stress lung uptake. TID is normal. No stress lung uptake.  Overall LV systolic function is normal with regional wall motion abnormality with akinesis in the inferior wall. Stress LV EF: 53%.  No significant change from 04/12/2018. Intermediate risk study.   Echocardiogram 11/11/2020: Left ventricle cavity is normal in size and wall thickness. Normal global wall motion. Normal LV systolic function with EF 55%. Doppler evidence of grade I (impaired) diastolic dysfunction, normal LAP. Structurally normal trileaflet aortic valve.  Mild (Grade I) aortic regurgitation. No  evidence of pulmonary hypertension. Unlike previous study in 2019, pulmonary hypertension not seen on this study.  Carotid artery duplex 06/30/2021: Duplex suggests stenosis in the right internal carotid artery (16-49%). Duplex suggests stenosis in the right external carotid artery (<50%). Duplex suggests stenosis in the left internal carotid artery (16-49%). Duplex suggests stenosis in the left external carotid artery (<50%). Antegrade right vertebral artery flow. Left vertebral  artery flow is not visualized. Follow up in one year is appropriate if clinically indicated.   EKG:  EKG 06/21/2022: Atrial fibrillation with controlled ventricular response at rate of 55 bpm, normal axis, nonspecific T abnormality.  EKG 06/21/2021: Sinus bradycardia at rate of 57 bpm, left atrial enlargement, normal axis, incomplete right bundle branch block.  No evidence of ischemia.  No change from 11/10/2020.  Assessment     ICD-10-CM   1. Coronary artery disease involving native coronary artery of native heart without angina pectoris  I25.10 EKG 12-Lead    PCV ECHOCARDIOGRAM COMPLETE    2. New onset atrial fibrillation (HCC)  I48.91 apixaban (ELIQUIS) 5 MG TABS tablet    3. Essential hypertension  I10     4. Hypercholesteremia  E78.00     5. Asymptomatic bilateral carotid artery stenosis  I65.23      Medications Discontinued During This Encounter  Medication Reason   aspirin EC 81 MG tablet Change in therapy   losartan (COZAAR) 25 MG tablet Change in therapy    Meds ordered this encounter  Medications   apixaban (ELIQUIS) 5 MG TABS tablet    Sig: Take 1 tablet (5 mg total) by mouth 2 (two) times daily.    Dispense:  60 tablet    Refill:  2   CHA2DS2-VASc Score is 4.  Yearly risk of stroke: 4.8% (Age, HTN, CAD).  Score of 1=0.6; 2=2.2; 3=3.2; 4=4.8; 5=7.2; 6=9.8; 7=>9.8) -(CHF; HTN; vasc disease DM,  Male = 1; Age <65 =0; 65-74 = 1,  >75 =2; stroke/embolism= 2).    Recommendations:     Justin Davidson is a 76 y.o. AAM with coronary artery disease status post left circumflex and ramus intermedius PCI in 2011, residual moderate disease in mid LAD, remote history of colon cancer status post colon resection and chemotherapy, benign periampullary adenoma SP transduodenal ampullectomy in 2021 and also left nephrectomy at the same time for primary renal cell carcinoma, hypertension, hyperlipidemia, hyperglycemia, severe objective sleep apnea not on CPAP, asymptomatic mild bilateral carotid artery stenosis presents here for annual visit.  He is now in new onset atrial fibrillation.  He is essentially asymptomatic from this.  However he does have risk for developing diastolic heart failure due to this, we will try to cardiovert him once he is fully anticoagulated.  We will repeat echocardiogram to reevaluate his LV systolic function.  He has had a low risk nuclear stress test in 2021.  We will start him on Eliquis 5 mg p.o. twice daily.  Extensive discussion with the patient and his daughter at the bedside regarding atrial fibrillation and risks for atrial fibrillation including but not limited to coronary artery disease, hypertension, morbid obesity, untreated obstructive sleep apnea.  Advised him to restart using CPAP.  They will look into the house and see if he does still indeed have the machine.  Blood pressure is well controlled, no clinical evidence of heart failure.  He is markedly sedentary.  Weight loss again discussed with the patient.  I would like to see him back in 4 weeks for follow-up.  We will consider setting him up for cardioversion at that time and he will need repeat carotid artery duplex at some point to follow-up on moderate disease bilaterally.  I had to wait to close on my encounter to reconcile the medications. Final list updated. Will discuss with patient regarding compliance.    Adrian Prows, MD, [facility] 06/21/2022, 10:17 PM Office: [phone] Pager: [phone]

## 2022-06-29 ENCOUNTER — Ambulatory Visit: Payer: Medicare Other

## 2022-06-29 DIAGNOSIS — I2510 Atherosclerotic heart disease of native coronary artery without angina pectoris: Secondary | ICD-10-CM | POA: Diagnosis not present

## 2022-07-13 ENCOUNTER — Ambulatory Visit: Payer: Medicare Other | Admitting: Neurology

## 2022-07-13 ENCOUNTER — Encounter: Payer: Self-pay | Admitting: Neurology

## 2022-07-13 VITALS — BP 133/62 | HR 52 | Ht 70.0 in | Wt 317.0 lb

## 2022-07-13 DIAGNOSIS — I480 Paroxysmal atrial fibrillation: Secondary | ICD-10-CM

## 2022-07-13 DIAGNOSIS — R0609 Other forms of dyspnea: Secondary | ICD-10-CM | POA: Diagnosis not present

## 2022-07-13 DIAGNOSIS — R4189 Other symptoms and signs involving cognitive functions and awareness: Secondary | ICD-10-CM

## 2022-07-13 DIAGNOSIS — Z85528 Personal history of other malignant neoplasm of kidney: Secondary | ICD-10-CM | POA: Diagnosis not present

## 2022-07-13 DIAGNOSIS — G4734 Idiopathic sleep related nonobstructive alveolar hypoventilation: Secondary | ICD-10-CM | POA: Diagnosis not present

## 2022-07-13 DIAGNOSIS — G4733 Obstructive sleep apnea (adult) (pediatric): Secondary | ICD-10-CM | POA: Diagnosis not present
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
## 2022-07-13 NOTE — Patient Instructions (Signed)

## 2022-07-19 ENCOUNTER — Telehealth: Payer: Self-pay | Admitting: Neurology

## 2022-07-19 NOTE — Telephone Encounter (Signed)
Wife is asking for a call to discuss if pt can use his CPAP since he is currently without anything else, wife stating he needs something, please call

## 2022-07-19 NOTE — Telephone Encounter (Signed)
Reviewed pt chart. Looks like he was set up with Resmed Airsense 10 07/18/2018 from [place]. Set at 7-18cm, 3cm EPR.  Per last OV 07/13/22:"Pt has been off CPAP since Dec 2021. Pt had surgery at the time and never restarted CPAP."  Per Dr. Brett Fairy note on 07/13/22. She ordered CPAP titration study to see if he needs BIPAP/oxygen.  Per notes from Tony as of 07/17/22, no auth needed. Needs to be scheduled for study.   I called pt wife. States he is snoring/fatigued. Would like to use current CPAP he has at home until he comes in for study to determine best next steps for treatment. Advised this would be ok. They should make sure to clean mask/hose with warm/soapy water before use and make sure no damage to equipment before use.  Aware Raquel Sarna in sleep lab will reach out soon to schedule CPAP titration study. She verbalized understanding.

## 2022-07-20 ENCOUNTER — Telehealth: Payer: Self-pay | Admitting: Neurology

## 2022-07-20 NOTE — Telephone Encounter (Signed)
CPAP no auth req'd (dtr-Kimberly may stay for 30 min to help pt get settled for the night. pt does not need help with the restroom)  Patient is scheduled at [facility] for 08/08/22 at 9 pm.  Mailed packet to the patient.

## 2022-07-21 ENCOUNTER — Encounter: Payer: Self-pay | Admitting: Cardiology

## 2022-07-21 ENCOUNTER — Ambulatory Visit: Payer: Medicare Other | Admitting: Cardiology

## 2022-07-21 VITALS — BP 122/73 | HR 57 | Temp 97.9°F | Resp 16 | Ht 70.0 in | Wt 316.0 lb

## 2022-07-21 DIAGNOSIS — I10 Essential (primary) hypertension: Secondary | ICD-10-CM

## 2022-07-21 DIAGNOSIS — I2510 Atherosclerotic heart disease of native coronary artery without angina pectoris: Secondary | ICD-10-CM | POA: Diagnosis not present

## 2022-07-21 NOTE — Progress Notes (Unsigned)
Primary Physician/Referring:  Merrilee Seashore, MD  Patient ID: Justin Davidson, male    DOB: 11-23-1946, 76 y.o.   MRN: [id number]  Chief Complaint  Patient presents with   Atrial Fibrillation   Coronary Artery Disease   Follow-up   HPI:    Nicholous Girgenti  is a 76 y.o. AAM with coronary artery disease status post left circumflex and ramus intermedius PCI in 2011, residual moderate disease in mid LAD, remote history of colon cancer status post colon resection and chemotherapy, benign periampullary adenoma SP transduodenal ampullectomy in 2021 and also left nephrectomy at the same time for primary renal cell carcinoma, hypertension, hyperlipidemia, hyperglycemia, severe objective sleep apnea not on CPAP, asymptomatic mild bilateral carotid artery stenosis presents here for annual visit.  Presently doing well and except for chronic dyspnea, no other symptoms.  He has not used any sublingual nitroglycerin and has not had any recurrence of chest pain.  Denies leg edema, PND or orthopnea.  Past Medical History:  Diagnosis Date   Anxiety    Colon cancer ([place])    Colon polyps    Coronary artery disease    Dementia (HCC)    Dizziness and giddiness    GERD (gastroesophageal reflux disease)    Hypercholesteremia    Hyperglycemia    Hypertension    OSA (obstructive sleep apnea)    CPAP    Pre-diabetes    Prediabetes    diet controlled    Social History   Tobacco Use   Smoking status: Never   Smokeless tobacco: Never  Substance Use Topics   Alcohol use: Yes    Comment: occasional  Marital Status: Married    ROS  Review of Systems  Cardiovascular:  Positive for dyspnea on exertion (stable). Negative for leg swelling and syncope.  Respiratory:  Positive for snoring (severe sleep apnea, not using CPAP).    Objective      07/21/2022   11:04 AM 07/13/2022    2:10 PM 06/21/2022   11:02 AM  Vitals with BMI  Height '5\' 10"'  '5\' 10"'  '5\' 10"'   Weight 316 lbs 317 lbs 319 lbs  BMI 45.34  30.86 57.84  Systolic 696 295 284  Diastolic 73 62 63  Pulse 57 52 54      Physical Exam Constitutional:      Appearance: He is well-developed.     Comments: Morbidly obese  Neck:     Vascular: No JVD.     Comments: Short neck and difficult to evaluate JVP Cardiovascular:     Rate and Rhythm: Normal rate and regular rhythm.     Pulses:          Carotid pulses are  on the right side with bruit and  on the left side with bruit.      Dorsalis pedis pulses are 2+ on the right side and 2+ on the left side.       Posterior tibial pulses are 2+ on the right side and 2+ on the left side.     Heart sounds: Heart sounds are distant. Murmur heard.     Midsystolic murmur is present with a grade of 2/6 at the upper right sternal border radiating to the neck.     No gallop.     Comments: Femoral and popliteal pulse difficult to feel due to patient's body habitus.  Pulmonary:     Effort: Pulmonary effort is normal.     Breath sounds: Normal breath sounds.  Abdominal:  General: Bowel sounds are normal.     Palpations: Abdomen is soft.     Comments: Obese. Pannus present  Musculoskeletal:     Right lower leg: No edema.     Left lower leg: No edema.    Laboratory examination:   External labs:  Labs 05/20/2022:  A1c 6.3%.  Total cholesterol 145, triglycerides 189, HDL 35, LDL 72.  121, creatinine 1.46, EGFR 53 mL, potassium 3.8.  LFTs normal.  Medications and allergies   Allergies  Allergen Reactions   Lisinopril Cough    Current Outpatient Medications:    acetaminophen (TYLENOL) 500 MG tablet, Take 500 mg by mouth every 6 (six) hours as needed., Disp: , Rfl:    amLODipine (NORVASC) 10 MG tablet, Take 1 tablet (10 mg total) by mouth daily., Disp: 90 tablet, Rfl: 3   apixaban (ELIQUIS) 5 MG TABS tablet, Take 1 tablet (5 mg total) by mouth 2 (two) times daily., Disp: 60 tablet, Rfl: 2   atorvastatin (LIPITOR) 80 MG tablet, TAKE 1 TABLET BY MOUTH DAILY, Disp: 90 tablet, Rfl: 3    busPIRone (BUSPAR) 7.5 MG tablet, Take 7.5 mg by mouth as needed., Disp: , Rfl:    clonazePAM (KLONOPIN) 0.5 MG tablet, Take 0.5 mg by mouth daily as needed for anxiety (stress)., Disp: , Rfl:    docusate sodium (COLACE) 100 MG capsule, Take 100 mg by mouth 2 (two) times daily as needed (constipation)., Disp: , Rfl:    donepezil (ARICEPT) 23 MG TABS tablet, Take 23 mg by mouth in the morning., Disp: , Rfl:    losartan-hydrochlorothiazide (HYZAAR) 50-12.5 MG tablet, Take 1 tablet by mouth daily., Disp: , Rfl:    metoprolol tartrate (LOPRESSOR) 50 MG tablet, TAKE 1 TABLET BY MOUTH TWICE DAILY (Patient taking differently: Take 50 mg by mouth 2 (two) times daily.), Disp: 180 tablet, Rfl: 3   potassium chloride (MICRO-K) 10 MEQ CR capsule, Take 10 mEq by mouth in the morning., Disp: , Rfl: 5   Psyllium (METAMUCIL FIBER PO), Take 1 Scoop by mouth 2 (two) times daily. , Disp: , Rfl:    tamsulosin (FLOMAX) 0.4 MG CAPS capsule, Take 0.4 mg by mouth in the morning., Disp: , Rfl:     Radiology:  No results found. Cardiac Studies:   Coronary angiogram 10/28/10:  3.5x23, Promus mid CX and 2.5x23 Promus mid Ramus intermediate (DES). Residual 50% -60% Mid LAD.  Lexiscan (Walking with mod Bruce)Tetrofosmin Stress Test  10/25/2020: Nondiagnostic ECG stress. There is diaphragmatic attenuation noted in the inferior wall. There is a fixed defect in the inferior region from base to the apex without reversibility suggestive of a large scar.  No stress lung uptake. TID is normal. No stress lung uptake.  Overall LV systolic function is normal with regional wall motion abnormality with akinesis in the inferior wall. Stress LV EF: 53%.  No significant change from 04/12/2018. Intermediate risk study.   Echocardiogram 11/11/2020: Left ventricle cavity is normal in size and wall thickness. Normal global wall motion. Normal LV systolic function with EF 55%. Doppler evidence of grade I (impaired) diastolic dysfunction,  normal LAP. Structurally normal trileaflet aortic valve.  Mild (Grade I) aortic regurgitation. No evidence of pulmonary hypertension. Unlike previous study in 2019, pulmonary hypertension not seen on this study.  Carotid artery duplex 06/30/2021: Duplex suggests stenosis in the right internal carotid artery (16-49%). Duplex suggests stenosis in the right external carotid artery (<50%). Duplex suggests stenosis in the left internal carotid artery (16-49%). Duplex suggests stenosis  in the left external carotid artery (<50%). Antegrade right vertebral artery flow. Left vertebral artery flow is not visualized. Follow up in one year is appropriate if clinically indicated.  PCV ECHOCARDIOGRAM COMPLETE 06/29/2022  Narrative Echocardiogram 06/29/2022: Normal LV systolic function with visual EF 60-65%. Left ventricle cavity is normal in size. Mild left ventricular hypertrophy. Normal global wall motion. Normal diastolic filling pattern, normal LAP. Mild (Grade I) aortic regurgitation. Mild tricuspid regurgitation. No evidence of pulmonary hypertension. The aortic root is normal. Mildly dilated proximal ascending aorta, 36m. Compared to 11/11/2020 Grade 1 DD is now normal,  mildly dilated proximal ascending is new otherwise no significant change.  EKG:  EKG 07/21/2022: Sinus bradycardia at rate of 57 bpm, normal axis, incomplete right bundle branch block.  Nonspecific T wave flattening.  Normal QT interval.  Compared to 06/29/2022, previously read as atrial fibrillation, review of the EKG reveals no change, essentially normal sinus rhythm.   And compared to 06/21/2021, no change, previously also the heart rate was 57 bpm.  EKG 06/21/2022: Atrial fibrillation with controlled ventricular response at rate of 55 bpm, normal axis, nonspecific T abnormality.   Assessment     ICD-10-CM   1. Coronary artery disease involving native coronary artery of native heart without angina pectoris  I25.10 EKG 12-Lead     2. Essential hypertension  I10       There are no discontinued medications.   No orders of the defined types were placed in this encounter.  CHA2DS2-VASc Score is 4.  Yearly risk of stroke: 4.8% (Age, HTN, CAD).  Score of 1=0.6; 2=2.2; 3=3.2; 4=4.8; 5=7.2; 6=9.8; 7=>9.8) -(CHF; HTN; vasc disease DM,  Male = 1; Age <65 =0; 65-74 = 1,  >75 =2; stroke/embolism= 2).    Recommendations:    TSuede Greenawaltis a 76y.o. AAM with coronary artery disease status post left circumflex and ramus intermedius PCI in 2011, residual moderate disease in mid LAD, remote history of colon cancer status post colon resection and chemotherapy, benign periampullary adenoma SP transduodenal ampullectomy in 2021 and also left nephrectomy at the same time for primary renal cell carcinoma, hypertension, hyperlipidemia, hyperglycemia, severe objective sleep apnea not on CPAP, asymptomatic mild bilateral carotid artery stenosis presents here for annual visit.  I had seen him 4 weeks ago and admits that his EKG as atrial fibrillation with controlled ventricular response.  Today the EKG quality is much better, hence I went back and reviewed the prior EKG which clearly is not in atrial fibrillation, he is in sinus rhythm.  Hence advised the patient and his wife who was present at the bedside and daughter on the telephone to discontinue Eliquis and start aspirin alone.  He has sleep apnea evaluation set up for the end of this month.  Advised him to keep up the appointment and encouraged him to be is compliant with CPAP.  Atrial fibrillation is now resolved and an erroneous entry and patient and family is aware of this.   JAdrian Prows MD, FSurgery Center Of [facility]8/10/2022, 11:35 AM Office: [phone]: [phone]

## 2022-08-08 ENCOUNTER — Ambulatory Visit (INDEPENDENT_AMBULATORY_CARE_PROVIDER_SITE_OTHER): Payer: Medicare Other | Admitting: Neurology

## 2022-08-08 DIAGNOSIS — R4189 Other symptoms and signs involving cognitive functions and awareness: Secondary | ICD-10-CM

## 2022-08-08 DIAGNOSIS — I480 Paroxysmal atrial fibrillation: Secondary | ICD-10-CM

## 2022-08-08 DIAGNOSIS — G4733 Obstructive sleep apnea (adult) (pediatric): Secondary | ICD-10-CM

## 2022-08-08 DIAGNOSIS — R0609 Other forms of dyspnea: Secondary | ICD-10-CM

## 2022-08-10 DIAGNOSIS — E782 Mixed hyperlipidemia: Secondary | ICD-10-CM | POA: Diagnosis not present

## 2022-08-10 DIAGNOSIS — I10 Essential (primary) hypertension: Secondary | ICD-10-CM | POA: Diagnosis not present

## 2022-08-10 DIAGNOSIS — R7303 Prediabetes: Secondary | ICD-10-CM | POA: Diagnosis not present

## 2022-08-22 NOTE — Progress Notes (Signed)
IMPRESSION:   1.Sleep hypopnea with oxygen desaturation was captured, apnea and hypoxia clustered in REM sleep.CPAP titration was successful. Sleep-disordered breathing improved at the recommended pressure of16cmH2O. Hypoxemiawas not completely resolvedwith therapy.   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  3. The patient had hoped to be offered an alternative to PAP therapy , but he has clearly REM sleep exacerbated hypopnea, precluding Inspire or dental device treatments.   4. Sleep architecture demonstrated fragmented sleep, longest sleep period was 1 hour.  5. Bradycardia in sinus rhythm.  RECOMMENDATIONS:  My hope was to see a better tolerance of PAP therapy under BiPAP and/ or oxygen. BiPAP was not implemented , oxygen was not implemented .  I prefer to initiate PAP treatment under a ResMed auto-CPAP with 3 cm EPR , settings of 7 through 18 cm water pressure , heated humidification and the patients favorite mask.  Cassian Torelli, MD Medical Director of [facility] at [facility], Board certified in Sleep Medicine and Neurology

## 2022-08-22 NOTE — Procedures (Signed)
[facility] CPAP TITRATION INTERPRETATION REPORT   STUDY DATE: 08/08/2022      PATIENT NAME:  Justin Davidson         DATE OF BIRTH:  [birth year]  PATIENT ID:  [id number]    TYPE OF STUDY:  CPAP  READING PHYSICIAN: Larey Seat, MD REFERRING: Dr. Einar Gip, MD. Dr. Ashby Dawes, MD SCORING TECHNICIAN: Richard Miu, RPSGT   HISTORY: Justin Davidson is a 76 year-old Male who reports excessive daytime sleepiness, has undergone adrenalectomy, Whipple surgery, and  Colectomy in the last 3 years-He was diagnosed with OSA 14 years ago, was on CPAP and used CPAP until 11-2020 , did not restart CPAP after major surgery. He reports fatigue, memory impairment and atrial fibrillation, dx just in June 2023, EF 53% , SOB,  morbid obesity  pulmonary hypertension.  The Epworth Sleepiness Scale was endorsed at 18 /24 (scores above or equal to 10 are suggestive of hypersomnolence). FSS at 47/63 points   ADDITIONAL INFORMATION:  Height: 70.0 in Weight: 317 lb (BMI 45) Neck Size: 20.0 in    MEDICATIONS: Tylenol, Norvasc, Eliquis, Lipitor, Buspar, Klonopin, Colace, Aricept, Hyzaar, Lopressor, Potassium Chloride, Metamucil, Flomax  TECHNICAL DESCRIPTION: A registered (RPSGT) sleep technologist was in attendance for the duration of the recording.  Data collection, scoring, video monitoring, and reporting were performed in compliance with the AASM Manual for the Scoring of Sleep and Associated Events; (Hypopnea is scored based on the criteria listed in Section VIII D. 1b in the AASM Manual V2.6 using a 4% oxygen desaturation rule or Hypopnea is scored based on the criteria listed in Section VIII D. 1a in the AASM Manual V2.6 using 3% oxygen desaturation and /or arousal rule).   The patient used his interface from home, a Respironics dream wear nasal mask in medium size, starting with CPAP at 5 cm water and finally titrated to 16 cm water , EPR of 3 cm water. The AHI was 3.3/h under CPAP at 16  cm water pressure but 02 desaturation was still noted, the only other pressure setting with an AHI  under 5/h was under 13 cm water, AHI was 4.4/h and associated with a higher sleep efficiency. No oxygen was added.    SLEEP CONTINUITY AND SLEEP ARCHITECTURE:  Lights off was at 21:46: and lights on 05:01: (7.3 hours in bed) Total recording time was 440 minutes. Total sleep time was 298.5 minutes. BODY POSITION: 100.0% supine;  0.0% lateral;  0.0% prone, 26.8% REM sleep, with a decreased sleep efficiency at 68.5%. Sleep latency was 17.0 minutes.  REM sleep latency was 24 minutes.   Total supine REM sleep time was 80 minutes (100.0% of total REM sleep). Of the total sleep time, the percentage of stage N1 sleep was 5.2%, stage N2 sleep was 57.5%, stage N3 sleep was 10.6%, and REM sleep was 26.8%. Wake after sleep onset (WASO) time accounted for 120 minutes. There were 6 Stage R periods observed on this study night, 18 awakenings (i.e. transitions to Stage W from any sleep stage), and 60.0 total stage transitions.   RESPIRATORY MONITORING:  Based on CMS criteria (using a 4% oxygen desaturation rule for scoring hypopneas), there were 3 apneas (0 obstructive; 3 central; 0 mixed), and 58 hypopneas.  Apnea index was 0.6/h. Hypopnea index was 11.7/h. The total  apnea-hypopnea index (AHI)  was 12.3/h (12.3/h AHI in  supine, 0.0 in  non-supine) there was an AHI of 22.5/h during  REM,  all supine REM, versus NREM AHI  of 7.6/h. There were 0 respiratory effort-related arousals (RERAs).   OXIMETRY: Total sleep time spent at, or below 88% was 4.2 minutes, or 1.4% of total sleep time.  Respiratory events in sleep were associated with oxyhemoglobin desaturations to a nadir of 77%  during REM sleep , from a mean of 94% saturation.  LIMB MOVEMENTS: There were 0 periodic limb movements of sleep (0.0/h), of which 0 (0.0/h) were associated with an arousal. AROUSALS: There were 32 arousals in total, for an arousal index of 6.4  /hour.  Of these, 12 were identified as respiratory-related arousals (2.4 /h), 0 were PLM-related arousals (0.0 /h), and 25 were non-specific arousals (5.0 /h) EKG: The electrocardiogram documented normal sinus rhythm .  The average heart rate during sleep was 55 bpm.  The minimum  heart rate during sleep was 49 bpm and maximum heart rate was 68 bpm.    EEG: symmetric and of normal amplitude.    AUDIO and VIDEO:  Movements of the upper extremity in a rhythmic pattern were noted during this recording, while in REM sleep -dream enactment was captured without vocalization.   IMPRESSION:   1.Sleep hypopnea with oxygen desaturation was captured, apnea and hypoxia clustered in REM sleep.  CPAP titration was successful.  Sleep-disordered breathing improved at the recommended pressure of 16 cmH2O. Hypoxemia was not completely resolved with therapy.   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  3. The patient had hoped to be offered an alternative to PAP therapy , but he has clearly REM sleep exacerbated hypopnea, precluding Inspire or dental device treatments.    4. Sleep architecture demonstrated fragmented sleep, longest sleep period was 1 hour.    RECOMMENDATIONS:   My hope was to see a better tolerance of PAP therapy under BiPAP and/ or oxygen.  BiPAP was not implemented , oxygen was not implemented .  I prefer to initiate PAP treatment under a ResMed auto-CPAP with 3 cm EPR , settings of 7 through 18 cm water pressure , heated humidification and the patients favorite mask.  Larey Seat,  MD Medical Director of [facility] at [facility], Board certified in Sleep Medicine and Neurology

## 2022-08-22 NOTE — Addendum Note (Signed)
Addended by: Larey Seat on: 08/22/2022 05:51 PM   Modules accepted: Orders

## 2022-08-24 ENCOUNTER — Telehealth: Payer: Self-pay | Admitting: Neurology

## 2022-08-24 NOTE — Telephone Encounter (Signed)
Called the pt and spoke with the daughter on DPR and reviewed the results. Advised that Dr Brett Fairy recommends keeping the CPAP settings the same. Since 16 cm water pressure was where he was best treated. The patient is due for a new machine 07/2023. I have scheduled the patient for a yearly follow up with Ward Givens, NP and a new machine can be ordered at that next visit. For now patient will continue to get supplies through [employer]

## 2022-08-24 NOTE — Telephone Encounter (Signed)
-----   Message from Larey Seat, MD sent at 08/22/2022  5:50 PM EDT ----- IMPRESSION:   1.Sleep hypopnea with oxygen desaturation was captured, apnea and hypoxia clustered in REM sleep.CPAP titration was successful. Sleep-disordered breathing improved at the recommended pressure of16cmH2O. Hypoxemiawas not completely resolvedwith therapy.   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  3. The patient had hoped to be offered an alternative to PAP therapy , but he has clearly REM sleep exacerbated hypopnea, precluding Inspire or dental device treatments.   4. Sleep architecture demonstrated fragmented sleep, longest sleep period was 1 hour.  5. Bradycardia in sinus rhythm.  RECOMMENDATIONS:  My hope was to see a better tolerance of PAP therapy under BiPAP and/ or oxygen. BiPAP was not implemented , oxygen was not implemented .  I prefer to initiate PAP treatment under a ResMed auto-CPAP with 3 cm EPR , settings of 7 through 18 cm water pressure , heated humidification and the patients favorite mask.  CarmenDohmeier, MD Medical Director of [facility] at [facility], Board certified in Sleep Medicine and Neurology

## 2022-09-22 DIAGNOSIS — R7303 Prediabetes: Secondary | ICD-10-CM | POA: Diagnosis not present

## 2022-09-22 DIAGNOSIS — E782 Mixed hyperlipidemia: Secondary | ICD-10-CM | POA: Diagnosis not present

## 2022-09-22 DIAGNOSIS — Z0000 Encounter for general adult medical examination without abnormal findings: Secondary | ICD-10-CM | POA: Diagnosis not present

## 2022-09-22 DIAGNOSIS — K219 Gastro-esophageal reflux disease without esophagitis: Secondary | ICD-10-CM | POA: Diagnosis not present

## 2022-09-22 DIAGNOSIS — R5383 Other fatigue: Secondary | ICD-10-CM | POA: Diagnosis not present

## 2022-09-22 DIAGNOSIS — Z23 Encounter for immunization: Secondary | ICD-10-CM | POA: Diagnosis not present

## 2022-09-22 DIAGNOSIS — I10 Essential (primary) hypertension: Secondary | ICD-10-CM | POA: Diagnosis not present

## 2022-09-22 DIAGNOSIS — N1831 Chronic kidney disease, stage 3a: Secondary | ICD-10-CM | POA: Diagnosis not present

## 2022-09-28 DIAGNOSIS — R7989 Other specified abnormal findings of blood chemistry: Secondary | ICD-10-CM | POA: Diagnosis not present

## 2022-09-28 DIAGNOSIS — N182 Chronic kidney disease, stage 2 (mild): Secondary | ICD-10-CM | POA: Diagnosis not present

## 2022-09-28 DIAGNOSIS — R7303 Prediabetes: Secondary | ICD-10-CM | POA: Diagnosis not present

## 2022-09-28 DIAGNOSIS — Z0000 Encounter for general adult medical examination without abnormal findings: Secondary | ICD-10-CM | POA: Diagnosis not present

## 2022-09-28 DIAGNOSIS — E782 Mixed hyperlipidemia: Secondary | ICD-10-CM | POA: Diagnosis not present

## 2022-09-28 DIAGNOSIS — I10 Essential (primary) hypertension: Secondary | ICD-10-CM | POA: Diagnosis not present

## 2022-09-28 DIAGNOSIS — K219 Gastro-esophageal reflux disease without esophagitis: Secondary | ICD-10-CM | POA: Diagnosis not present

## 2022-09-28 DIAGNOSIS — M5430 Sciatica, unspecified side: Secondary | ICD-10-CM | POA: Diagnosis not present

## 2022-10-30 ENCOUNTER — Emergency Department (HOSPITAL_COMMUNITY): Payer: Medicare Other

## 2022-10-30 ENCOUNTER — Inpatient Hospital Stay (HOSPITAL_COMMUNITY): Payer: Medicare Other

## 2022-10-30 ENCOUNTER — Inpatient Hospital Stay (HOSPITAL_COMMUNITY)
Admission: EM | Admit: 2022-10-30 | Discharge: 2022-11-02 | DRG: 193 | Disposition: A | Discharge status: Home / Self Care | Payer: Medicare Other | Attending: Internal Medicine | Admitting: Internal Medicine

## 2022-10-30 ENCOUNTER — Other Ambulatory Visit: Payer: Self-pay

## 2022-10-30 DIAGNOSIS — E876 Hypokalemia: Secondary | ICD-10-CM | POA: Diagnosis present

## 2022-10-30 DIAGNOSIS — Z6841 Body Mass Index (BMI) 40.0 and over, adult: Secondary | ICD-10-CM | POA: Diagnosis not present

## 2022-10-30 DIAGNOSIS — R7303 Prediabetes: Secondary | ICD-10-CM | POA: Diagnosis present

## 2022-10-30 DIAGNOSIS — Z85038 Personal history of other malignant neoplasm of large intestine: Secondary | ICD-10-CM | POA: Diagnosis not present

## 2022-10-30 DIAGNOSIS — E860 Dehydration: Secondary | ICD-10-CM | POA: Diagnosis present

## 2022-10-30 DIAGNOSIS — Z91199 Patient's noncompliance with other medical treatment and regimen due to unspecified reason: Secondary | ICD-10-CM

## 2022-10-30 DIAGNOSIS — Z8249 Family history of ischemic heart disease and other diseases of the circulatory system: Secondary | ICD-10-CM

## 2022-10-30 DIAGNOSIS — I2510 Atherosclerotic heart disease of native coronary artery without angina pectoris: Secondary | ICD-10-CM | POA: Diagnosis present

## 2022-10-30 DIAGNOSIS — K649 Unspecified hemorrhoids: Secondary | ICD-10-CM | POA: Diagnosis present

## 2022-10-30 DIAGNOSIS — J189 Pneumonia, unspecified organism: Principal | ICD-10-CM | POA: Diagnosis present

## 2022-10-30 DIAGNOSIS — F419 Anxiety disorder, unspecified: Secondary | ICD-10-CM | POA: Diagnosis present

## 2022-10-30 DIAGNOSIS — U099 Post covid-19 condition, unspecified: Secondary | ICD-10-CM | POA: Diagnosis present

## 2022-10-30 DIAGNOSIS — Z833 Family history of diabetes mellitus: Secondary | ICD-10-CM

## 2022-10-30 DIAGNOSIS — Z905 Acquired absence of kidney: Secondary | ICD-10-CM

## 2022-10-30 DIAGNOSIS — G4733 Obstructive sleep apnea (adult) (pediatric): Secondary | ICD-10-CM | POA: Diagnosis present

## 2022-10-30 DIAGNOSIS — F0390 Unspecified dementia without behavioral disturbance: Secondary | ICD-10-CM | POA: Diagnosis present

## 2022-10-30 DIAGNOSIS — Z888 Allergy status to other drugs, medicaments and biological substances status: Secondary | ICD-10-CM

## 2022-10-30 DIAGNOSIS — R053 Chronic cough: Secondary | ICD-10-CM | POA: Diagnosis present

## 2022-10-30 DIAGNOSIS — I10 Essential (primary) hypertension: Secondary | ICD-10-CM | POA: Diagnosis present

## 2022-10-30 DIAGNOSIS — J440 Chronic obstructive pulmonary disease with acute lower respiratory infection: Secondary | ICD-10-CM | POA: Diagnosis present

## 2022-10-30 DIAGNOSIS — R0602 Shortness of breath: Secondary | ICD-10-CM | POA: Diagnosis not present

## 2022-10-30 DIAGNOSIS — J9621 Acute and chronic respiratory failure with hypoxia: Secondary | ICD-10-CM | POA: Diagnosis not present

## 2022-10-30 DIAGNOSIS — K219 Gastro-esophageal reflux disease without esophagitis: Secondary | ICD-10-CM | POA: Diagnosis present

## 2022-10-30 DIAGNOSIS — K625 Hemorrhage of anus and rectum: Secondary | ICD-10-CM | POA: Diagnosis present

## 2022-10-30 DIAGNOSIS — E7800 Pure hypercholesterolemia, unspecified: Secondary | ICD-10-CM | POA: Diagnosis present

## 2022-10-30 DIAGNOSIS — Z79899 Other long term (current) drug therapy: Secondary | ICD-10-CM

## 2022-10-30 DIAGNOSIS — Z8719 Personal history of other diseases of the digestive system: Secondary | ICD-10-CM

## 2022-10-30 DIAGNOSIS — J9601 Acute respiratory failure with hypoxia: Secondary | ICD-10-CM | POA: Diagnosis present

## 2022-10-30 DIAGNOSIS — R9431 Abnormal electrocardiogram [ECG] [EKG]: Secondary | ICD-10-CM | POA: Diagnosis not present

## 2022-10-30 DIAGNOSIS — Z823 Family history of stroke: Secondary | ICD-10-CM

## 2022-10-30 DIAGNOSIS — I480 Paroxysmal atrial fibrillation: Secondary | ICD-10-CM | POA: Diagnosis present

## 2022-10-30 DIAGNOSIS — Z9049 Acquired absence of other specified parts of digestive tract: Secondary | ICD-10-CM

## 2022-10-30 DIAGNOSIS — Z7901 Long term (current) use of anticoagulants: Secondary | ICD-10-CM | POA: Diagnosis not present

## 2022-10-30 DIAGNOSIS — Z83438 Family history of other disorder of lipoprotein metabolism and other lipidemia: Secondary | ICD-10-CM

## 2022-10-30 LAB — I-STAT ARTERIAL BLOOD GAS, ED
Acid-Base Excess: 2 mmol/L (ref 0.0–2.0)
Bicarbonate: 26.6 mmol/L (ref 20.0–28.0)
Calcium, Ion: 1.17 mmol/L (ref 1.15–1.40)
HCT: 37 % — ABNORMAL LOW (ref 39.0–52.0)
Hemoglobin: 12.6 g/dL — ABNORMAL LOW (ref 13.0–17.0)
O2 Saturation: 90 %
Patient temperature: 98.7
Potassium: 3.3 mmol/L — ABNORMAL LOW (ref 3.5–5.1)
Sodium: 140 mmol/L (ref 135–145)
TCO2: 28 mmol/L (ref 22–32)
pCO2 arterial: 39.1 mmHg (ref 32–48)
pH, Arterial: 7.44 (ref 7.35–7.45)
pO2, Arterial: 57 mmHg — ABNORMAL LOW (ref 83–108)

## 2022-10-30 LAB — EXPECTORATED SPUTUM ASSESSMENT W GRAM STAIN, RFLX TO RESP C

## 2022-10-30 LAB — CBC
HCT: 40.6 % (ref 39.0–52.0)
Hemoglobin: 13.5 g/dL (ref 13.0–17.0)
MCH: 31.2 pg (ref 26.0–34.0)
MCHC: 33.3 g/dL (ref 30.0–36.0)
MCV: 93.8 fL (ref 80.0–100.0)
Platelets: 429 10*3/uL — ABNORMAL HIGH (ref 150–400)
RBC: 4.33 MIL/uL (ref 4.22–5.81)
RDW: 13.8 % (ref 11.5–15.5)
WBC: 10.3 10*3/uL (ref 4.0–10.5)
nRBC: 0.2 % (ref 0.0–0.2)

## 2022-10-30 LAB — BASIC METABOLIC PANEL
Anion gap: 13 (ref 5–15)
BUN: 17 mg/dL (ref 8–23)
CO2: 24 mmol/L (ref 22–32)
Calcium: 8.5 mg/dL — ABNORMAL LOW (ref 8.9–10.3)
Chloride: 102 mmol/L (ref 98–111)
Creatinine, Ser: 1.35 mg/dL — ABNORMAL HIGH (ref 0.61–1.24)
GFR, Estimated: 54 mL/min — ABNORMAL LOW (ref >60)
Glucose, Bld: 112 mg/dL — ABNORMAL HIGH (ref 70–99)
Potassium: 3.2 mmol/L — ABNORMAL LOW (ref 3.5–5.1)
Sodium: 139 mmol/L (ref 135–145)

## 2022-10-30 LAB — TROPONIN I (HIGH SENSITIVITY)
Troponin I (High Sensitivity): 43 ng/L — ABNORMAL HIGH (ref <18)
Troponin I (High Sensitivity): 46 ng/L — ABNORMAL HIGH (ref <18)

## 2022-10-30 LAB — LACTIC ACID, PLASMA
Lactic Acid, Venous: 1.8 mmol/L (ref 0.5–1.9)
Lactic Acid, Venous: 2 mmol/L (ref 0.5–1.9)

## 2022-10-30 LAB — HEMOGLOBIN AND HEMATOCRIT, BLOOD
HCT: 39.4 % (ref 39.0–52.0)
Hemoglobin: 13.1 g/dL (ref 13.0–17.0)

## 2022-10-30 LAB — STREP PNEUMONIAE URINARY ANTIGEN: Strep Pneumo Urinary Antigen: NEGATIVE

## 2022-10-30 LAB — BRAIN NATRIURETIC PEPTIDE: B Natriuretic Peptide: 43.9 pg/mL (ref 0.0–100.0)

## 2022-10-30 LAB — PROCALCITONIN: Procalcitonin: <0.1 ng/mL

## 2022-10-30 MED ORDER — BUDESONIDE 0.25 MG/2ML IN SUSP
0.2500 mg | Freq: Two times a day (BID) | RESPIRATORY_TRACT | Status: DC
  Administered 2022-10-30 – 2022-10-31 (×3): 0.25 mg via RESPIRATORY_TRACT
  Filled 2022-10-30 (×3): qty 2

## 2022-10-30 MED ORDER — SODIUM CHLORIDE 0.9 % IV SOLN: 2.0000 g | INTRAVENOUS | Status: DC

## 2022-10-30 MED ORDER — SODIUM CHLORIDE 0.9 % IV SOLN
2.0000 g | Freq: Once | INTRAVENOUS | Status: AC
  Administered 2022-10-30: 2 g via INTRAVENOUS
  Filled 2022-10-30: qty 20

## 2022-10-30 MED ORDER — APIXABAN 5 MG PO TABS: 5.0000 mg | ORAL_TABLET | Freq: Two times a day (BID) | ORAL | Status: DC

## 2022-10-30 MED ORDER — LOSARTAN POTASSIUM 50 MG PO TABS
50.0000 mg | ORAL_TABLET | Freq: Every day | ORAL | Status: DC
  Administered 2022-10-31 – 2022-11-02 (×3): 50 mg via ORAL
  Filled 2022-10-30 (×3): qty 1

## 2022-10-30 MED ORDER — DONEPEZIL HCL 23 MG PO TABS
23.0000 mg | ORAL_TABLET | Freq: Every morning | ORAL | Status: DC
  Administered 2022-10-31 – 2022-11-02 (×3): 23 mg via ORAL
  Filled 2022-10-30 (×3): qty 1

## 2022-10-30 MED ORDER — AMLODIPINE BESYLATE 10 MG PO TABS
10.0000 mg | ORAL_TABLET | Freq: Every day | ORAL | Status: DC
  Administered 2022-10-31 – 2022-11-02 (×3): 10 mg via ORAL
  Filled 2022-10-30 (×2): qty 1

## 2022-10-30 MED ORDER — PSYLLIUM 95 % PO PACK
1.0000 | PACK | Freq: Two times a day (BID) | ORAL | Status: DC
  Administered 2022-10-31 – 2022-11-02 (×4): 1 via ORAL
  Filled 2022-10-30 (×7): qty 1

## 2022-10-30 MED ORDER — SODIUM CHLORIDE 0.9 % IV SOLN
500.0000 mg | Freq: Once | INTRAVENOUS | Status: AC
  Administered 2022-10-30: 500 mg via INTRAVENOUS
  Filled 2022-10-30: qty 5

## 2022-10-30 MED ORDER — GUAIFENESIN ER 600 MG PO TB12
1200.0000 mg | ORAL_TABLET | Freq: Two times a day (BID) | ORAL | Status: DC
  Administered 2022-10-30 (×2): 1200 mg via ORAL
  Filled 2022-10-30 (×2): qty 2

## 2022-10-30 MED ORDER — CLONAZEPAM 0.5 MG PO TABS
0.5000 mg | ORAL_TABLET | Freq: Every day | ORAL | Status: DC | PRN
  Administered 2022-10-30: 0.5 mg via ORAL
  Filled 2022-10-30: qty 1

## 2022-10-30 MED ORDER — LOSARTAN POTASSIUM-HCTZ 50-12.5 MG PO TABS: 1.0000 | ORAL_TABLET | Freq: Every day | ORAL | Status: DC

## 2022-10-30 MED ORDER — IPRATROPIUM-ALBUTEROL 0.5-2.5 (3) MG/3ML IN SOLN
3.0000 mL | Freq: Four times a day (QID) | RESPIRATORY_TRACT | Status: DC
  Administered 2022-10-30 (×2): 3 mL via RESPIRATORY_TRACT
  Filled 2022-10-30 (×2): qty 3

## 2022-10-30 MED ORDER — HYDROCHLOROTHIAZIDE 12.5 MG PO TABS
12.5000 mg | ORAL_TABLET | Freq: Every day | ORAL | Status: DC
  Administered 2022-10-31 – 2022-11-02 (×3): 12.5 mg via ORAL
  Filled 2022-10-30 (×3): qty 1

## 2022-10-30 MED ORDER — BENZONATATE 100 MG PO CAPS: 200.0000 mg | ORAL_CAPSULE | Freq: Three times a day (TID) | ORAL | Status: DC | PRN

## 2022-10-30 MED ORDER — ACETAMINOPHEN 500 MG PO TABS: 500.0000 mg | ORAL_TABLET | Freq: Four times a day (QID) | ORAL | Status: DC | PRN

## 2022-10-30 MED ORDER — METOPROLOL TARTRATE 50 MG PO TABS
50.0000 mg | ORAL_TABLET | Freq: Two times a day (BID) | ORAL | Status: DC
  Administered 2022-10-30 – 2022-11-02 (×6): 50 mg via ORAL
  Filled 2022-10-30 (×6): qty 1

## 2022-10-30 MED ORDER — ALBUTEROL SULFATE (2.5 MG/3ML) 0.083% IN NEBU: 6.0000 mL | INHALATION_SOLUTION | Freq: Four times a day (QID) | RESPIRATORY_TRACT | Status: DC | PRN

## 2022-10-30 MED ORDER — POTASSIUM CHLORIDE CRYS ER 20 MEQ PO TBCR: 20.0000 meq | EXTENDED_RELEASE_TABLET | Freq: Every day | ORAL | Status: DC

## 2022-10-30 MED ORDER — DOCUSATE SODIUM 100 MG PO CAPS: 100.0000 mg | ORAL_CAPSULE | Freq: Two times a day (BID) | ORAL | Status: DC | PRN

## 2022-10-30 MED ORDER — ATORVASTATIN CALCIUM 80 MG PO TABS
80.0000 mg | ORAL_TABLET | Freq: Every day | ORAL | Status: DC
  Administered 2022-10-31 – 2022-11-02 (×3): 80 mg via ORAL
  Filled 2022-10-30 (×3): qty 1

## 2022-10-30 MED ORDER — APIXABAN 5 MG PO TABS
5.0000 mg | ORAL_TABLET | Freq: Two times a day (BID) | ORAL | Status: DC
  Administered 2022-10-31 – 2022-11-02 (×5): 5 mg via ORAL
  Filled 2022-10-30 (×5): qty 1

## 2022-10-30 MED ORDER — AZITHROMYCIN 500 MG PO TABS
500.0000 mg | ORAL_TABLET | Freq: Every day | ORAL | Status: DC
  Filled 2022-10-30: qty 1

## 2022-10-30 MED ORDER — TAMSULOSIN HCL 0.4 MG PO CAPS
0.4000 mg | ORAL_CAPSULE | Freq: Every morning | ORAL | Status: DC
  Administered 2022-10-31 – 2022-11-02 (×3): 0.4 mg via ORAL
  Filled 2022-10-30 (×3): qty 1

## 2022-10-30 NOTE — Progress Notes (Addendum)
Lab called to report critical value lactic 2.0. Call received at 22:04. On call provider J. Olena Heckle APP was notified through [facility] at 22:18. No acute distress noted

## 2022-10-30 NOTE — Progress Notes (Signed)
CT image reviewed, compatible with multifocal pneumonia bacterial likely correlated to post COVID infection PNA.  Monia less likely given patient cough has been productive and COVID was positive > 2 weeks ago. Will continue current antibiotic regimen.

## 2022-10-30 NOTE — ED Triage Notes (Signed)
Pt. Stated, [name] had upper respiratory problem for years and been to see lots of Doctors.I think it gets worse I can't sleep at night cause I can't breath.  I have been wiping blood and everytime I go theres blood. He had COVID 3 weeks ago and not eating good .

## 2022-10-30 NOTE — ED Provider Triage Note (Signed)
Emergency Medicine Provider Triage Evaluation Note  Justin Davidson , a 76 y.o. male  was evaluated in triage.  History dementia presented with wife and daughter today.  Patient has chronic shortness of breath and uses CPAP.  Symptoms have worsened over the past 10 days, he was diagnosed with COVID-19 10 days ago and has not increased cough and sputum production.  No associated chest pain.  Shortness of breath worse when lying flat.  Patient unable to tolerate CPAP due to dementia.  Additionally they noticed rectal bleeding over the last 2 days  Review of Systems  Positive: Shortness of breath, sputum production, rectal bleeding Negative: Chest pain, hemoptysis, abdominal pain, extremity swelling or any additional concerns  Physical Exam  BP (!) 109/58 (BP Location: Right Arm)   Pulse (!) 58   Temp 99 F (37.2 C) (Oral)   Resp 19   Ht '5\' 11"'$  (1.803 m)   Wt (!) 142 kg   SpO2 91%   BMI 43.65 kg/m  Gen:   Awake, no distress   Resp:  Normal effort, diminished lung sounds bilaterally MSK:   Moves extremities without difficulty  Other:  Heart regular rate and rhythm  Medical Decision Making  Medically screening exam initiated at 10:28 AM.  Appropriate orders placed.  Justin Davidson was informed that the remainder of the evaluation will be completed by another provider, this initial triage assessment does not replace that evaluation, and the importance of remaining in the ED until their evaluation is complete.   Note: Portions of this report may have been transcribed using voice recognition software. Every effort was made to ensure accuracy; however, inadvertent computerized transcription errors may still be present.    Deliah Boston, PA-C 10/30/22 1029

## 2022-10-30 NOTE — Evaluation (Signed)
Clinical/Bedside Swallow Evaluation Patient Details  Name: Justin Davidson MRN: [id number] Date of Birth: [birth year]  Today's Date: 10/30/2022 Time: SLP Start Time (ACUTE ONLY): 1520 SLP Stop Time (ACUTE ONLY): 1540 SLP Time Calculation (min) (ACUTE ONLY): 20 min  Past Medical History:  Past Medical History:  Diagnosis Date   Anxiety    Colon cancer ([place])    Colon polyps    Coronary artery disease    Dementia (HCC)    Dizziness and giddiness    GERD (gastroesophageal reflux disease)    Hypercholesteremia    Hyperglycemia    Hypertension    OSA (obstructive sleep apnea)    CPAP    Pre-diabetes    Prediabetes    diet controlled   Past Surgical History:  Past Surgical History:  Procedure Laterality Date   ADRENALECTOMY Left 12/07/2020   Procedure: LEFT ADRENALECTOMY;  Surgeon: Janith Lima, MD;  Location: [place];  Service: Urology;  Laterality: Left;   BIOPSY  09/13/2020   Procedure: BIOPSY;  Surgeon: Rush Landmark Telford Nab., MD;  Location: [place];  Service: Gastroenterology;;   cardaic stents     CARDIAC CATHETERIZATION  10/28/2010   CHOLECYSTECTOMY N/A 12/07/2020   Procedure: CHOLECYSTECTOMY;  Surgeon: Dwan Bolt, MD;  Location: [place];  Service: General;  Laterality: N/A;   COLONOSCOPY     ESOPHAGOGASTRODUODENOSCOPY (EGD) WITH PROPOFOL N/A 04/19/2020   Procedure: ESOPHAGOGASTRODUODENOSCOPY (EGD) WITH PROPOFOL;  Surgeon: Clarene Essex, MD;  [facility];  Service: Endoscopy;  Laterality: N/A;   ESOPHAGOGASTRODUODENOSCOPY (EGD) WITH PROPOFOL N/A 09/13/2020   Procedure: ESOPHAGOGASTRODUODENOSCOPY (EGD) WITH PROPOFOL;  Surgeon: Rush Landmark Telford Nab., MD;  Location: [place];  Service: Gastroenterology;  Laterality: N/A;   ESOPHAGOGASTRODUODENOSCOPY (EGD) WITH PROPOFOL N/A 05/31/2021   Procedure: ESOPHAGOGASTRODUODENOSCOPY (EGD) WITH PROPOFOL;  Surgeon: Clarene Essex, MD;  Location: [facility];  Service: Endoscopy;  Laterality: N/A;  please have side viewing  scope available   EUS  09/13/2020   Procedure: UPPER ENDOSCOPIC ULTRASOUND (EUS) RADIAL;  Surgeon: Irving Copas., MD;  Location: [place];  Service: Gastroenterology;;   HOT HEMOSTASIS N/A 04/19/2020   Procedure: HOT HEMOSTASIS (ARGON PLASMA COAGULATION/BICAP);  Surgeon: Clarene Essex, MD;  Location: [facility];  Service: Endoscopy;  Laterality: N/A;   NEPHRECTOMY Left 12/07/2020   Procedure: OPEN LEFT RADICAL NEPHRECTOMY;  Surgeon: Janith Lima, MD;  Location: [place];  Service: Urology;  Laterality: Left;   PARTIAL COLECTOMY  1996   colon cancer   POLYPECTOMY  05/31/2021   Procedure: POLYPECTOMY;  Surgeon: Clarene Essex, MD;  [facility];  Service: Endoscopy;;   WHIPPLE PROCEDURE N/A 12/07/2020   Procedure: OPEN TRANSDUODENAL AMPULLECTOMY;  Surgeon: Dwan Bolt, MD;  Location: [place];  Service: General;  Laterality: N/A;   HPI:  Justin Davidson is a 76 y.o. male with medical history significant for dementia, HTN, HLD, OSA not tolerating CPAP, morbid obesity, GERD, duodenal adenoma status post transduodenal ampullectomy 2021, PAF on Eliquis, CAD, presented to ED with worsening of cough and shortness of breath. Dx pna.  Family reports no hx aspiration, occasional choking/cough after eating.    Assessment / Plan / Recommendation  Clinical Impression  Pt participated in a clinical swallowing assessment - his wife and dtr were at the bedside.  They report coughing occasionally with certain foods, but primarily he wakes up coughing at night.  His wife was assisting him with his lunch tray upon entering.  Oral mechanism exam was normal. Demonstrated adequate mastication, brisk swallow response, and no s/s  of aspiration while eating chicken, potatoes, and drinking 10 oz of liquid. No s/s of an oropharyngeal dysphagia. Continue current diet- our service will sign off. SLP Visit Diagnosis: Dysphagia, unspecified (R13.10)    Aspiration Risk  No limitations    Diet Recommendation    Regular solids, thin liquids  Medication Administration: Whole meds with liquid    Other  Recommendations Oral Care Recommendations: Oral care BID    Recommendations for follow up therapy are one component of a multi-disciplinary discharge planning process, led by the attending physician.  Recommendations may be updated based on patient status, additional functional criteria and insurance authorization.  Follow up Recommendations No SLP follow up        [place] Date of Onset: 10/30/22 HPI: Justin Davidson is a 76 y.o. male with medical history significant for dementia, HTN, HLD, OSA not tolerating CPAP, morbid obesity, GERD, duodenal adenoma status post transduodenal ampullectomy 2021, PAF on Eliquis, CAD, presented to ED with worsening of cough and shortness of breath. Dx pna.  Family reports no hx aspiration, occasional choking/cough after eating. Type of Study: Bedside Swallow Evaluation Previous Swallow Assessment: no Diet Prior to this Study: Regular;Thin liquids Temperature Spikes Noted: No Respiratory Status: Nasal cannula History of Recent Intubation: No Behavior/Cognition: Alert;Cooperative Oral Cavity Assessment: Within Functional Limits Oral Care Completed by SLP: No Oral Cavity - Dentition: Adequate natural dentition Vision: Functional for self-feeding Self-Feeding Abilities: Able to feed self Patient Positioning: Partially reclined Baseline Vocal Quality: Normal Volitional Cough: Strong Volitional Swallow: Able to elicit    Oral/Motor/Sensory Function Overall Oral Motor/Sensory Function: Within functional limits   Ice Chips Ice chips: Within functional limits   Thin Liquid Thin Liquid: Within functional limits    Nectar Thick Nectar Thick Liquid: Not tested   Honey Thick Honey Thick Liquid: Not tested   Puree Puree: Within functional limits   Solid     Solid: Within functional limits      Juan Quam Laurice 10/30/2022,3:41 PM   Estill Bamberg  L. Tivis Ringer, MA CCC/SLP Clinical Specialist - [place] Office number [phone]

## 2022-10-30 NOTE — H&P (Signed)
History and Physical    Justin Davidson BTD:[id number] DOB: 07-03-1946 DOA: 10/30/2022  PCP: Merrilee Seashore, MD (Confirm with patient/family/NH records and if not entered, this has to be entered at [facility] point of entry) Patient coming from: Home  I have personally briefly reviewed patient's old medical records in [place]  Chief Complaint: Cough, SOB  HPI: Justin Davidson is a 76 y.o. male with medical history significant of dementia, HTN, HLD, OSA not tolerating CPAP, morbid obesity, duodenal adenoma status post transduodenal ampullectomy 2021, PAF on Eliquis, CAD, came with worsening of cough and shortness of breath.  Patient is demented unable to provide much history, history provided by wife and daughter at bedside.  Family reported that patient has had a chronic cough "long term upper respiration symptoms" including alternative dry and productive cough with whitish phlegm, but no weight loss for night sweats.  3 weeks ago patient started to have worsening of his cough and started to feel shortness of breath with minimal activity, home COVID kit test turned positive, but family decided not to give the patient antibiotic treatment that time.  But gradually, family noticed patient breathing symptoms continue to deteriorated, with more productive cough with whitish phlegm, and started to have night sweats, and cough more happens at night but no wheezing.  And patient unable to sleep last night because of the cough and shortness of breath.  Family also reported patient has no history of aspiration but does have occasional choking/cough after eating certain foods.  She is on Eliquis and last few days, patient also found bright red blood when wiping, no ABD pain. His GERD has been fairly controlled no heart burns.  ED Course: Was found hypoxic saturation 80% on room air and stabilized 2 L O2 saturation 94%.  Chest x-ray showed multifocal pneumonia.  Blood work WBC 10.3, hemoglobin 13.5, platelet  429, WBC 10.3.  Creatinine 1.3 24.  Patient was started on ceftriaxone and azithromycin in the ED.  Review of Systems: Unable to Perform, patient demented at baseline  Past Medical History:  Diagnosis Date   Anxiety    Colon cancer ([place])    Colon polyps    Coronary artery disease    Dementia (HCC)    Dizziness and giddiness    GERD (gastroesophageal reflux disease)    Hypercholesteremia    Hyperglycemia    Hypertension    OSA (obstructive sleep apnea)    CPAP    Pre-diabetes    Prediabetes    diet controlled    Past Surgical History:  Procedure Laterality Date   ADRENALECTOMY Left 12/07/2020   Procedure: LEFT ADRENALECTOMY;  Surgeon: Janith Lima, MD;  Location: [place];  Service: Urology;  Laterality: Left;   BIOPSY  09/13/2020   Procedure: BIOPSY;  Surgeon: Rush Landmark Telford Nab., MD;  Location: [place];  Service: Gastroenterology;;   cardaic stents     CARDIAC CATHETERIZATION  10/28/2010   CHOLECYSTECTOMY N/A 12/07/2020   Procedure: CHOLECYSTECTOMY;  Surgeon: Dwan Bolt, MD;  Location: [place];  Service: General;  Laterality: N/A;   COLONOSCOPY     ESOPHAGOGASTRODUODENOSCOPY (EGD) WITH PROPOFOL N/A 04/19/2020   Procedure: ESOPHAGOGASTRODUODENOSCOPY (EGD) WITH PROPOFOL;  Surgeon: Clarene Essex, MD;  [facility];  Service: Endoscopy;  Laterality: N/A;   ESOPHAGOGASTRODUODENOSCOPY (EGD) WITH PROPOFOL N/A 09/13/2020   Procedure: ESOPHAGOGASTRODUODENOSCOPY (EGD) WITH PROPOFOL;  Surgeon: Rush Landmark Telford Nab., MD;  Location: [place];  Service: Gastroenterology;  Laterality: N/A;   ESOPHAGOGASTRODUODENOSCOPY (EGD) WITH PROPOFOL N/A 05/31/2021  Procedure: ESOPHAGOGASTRODUODENOSCOPY (EGD) WITH PROPOFOL;  Surgeon: Clarene Essex, MD;  [facility];  Service: Endoscopy;  Laterality: N/A;  please have side viewing scope available   EUS  09/13/2020   Procedure: UPPER ENDOSCOPIC ULTRASOUND (EUS) RADIAL;  Surgeon: Irving Copas., MD;  Location: [place];  Service: Gastroenterology;;   HOT HEMOSTASIS N/A 04/19/2020   Procedure: HOT HEMOSTASIS (ARGON PLASMA COAGULATION/BICAP);  Surgeon: Clarene Essex, MD;  Location: [facility];  Service: Endoscopy;  Laterality: N/A;   NEPHRECTOMY Left 12/07/2020   Procedure: OPEN LEFT RADICAL NEPHRECTOMY;  Surgeon: Janith Lima, MD;  Location: [place];  Service: Urology;  Laterality: Left;   PARTIAL COLECTOMY  1996   colon cancer   POLYPECTOMY  05/31/2021   Procedure: POLYPECTOMY;  Surgeon: Clarene Essex, MD;  [facility];  Service: Endoscopy;;   WHIPPLE PROCEDURE N/A 12/07/2020   Procedure: OPEN TRANSDUODENAL AMPULLECTOMY;  Surgeon: Dwan Bolt, MD;  Location: [place];  Service: General;  Laterality: N/A;     reports that he has never smoked. He has never used smokeless tobacco. He reports current alcohol use. He reports that he does not currently use drugs.  Allergies  Allergen Reactions   Lisinopril Cough    Family History  Problem Relation Age of Onset   Stroke Mother    Diabetes Mother    Hypertension Father    Hyperlipidemia Father    Seizures Father    Diabetes Father    Hypertension Sister    Hyperlipidemia Sister    Heart disease Paternal Grandfather    Diabetes Paternal Grandfather    GER disease Daughter    Colon cancer Neg Hx    Esophageal cancer Neg Hx    Inflammatory bowel disease Neg Hx    Liver disease Neg Hx    Pancreatic cancer Neg Hx    Rectal cancer Neg Hx    Stomach cancer Neg Hx      Prior to Admission medications   Medication Sig Start Date End Date Taking? Authorizing Provider  acetaminophen (TYLENOL) 500 MG tablet Take 500 mg by mouth every 6 (six) hours as needed.    [provider]  amLODipine (NORVASC) 10 MG tablet Take 1 tablet (10 mg total) by mouth daily. 11/10/20   Adrian Prows, MD  apixaban (ELIQUIS) 5 MG TABS tablet Take 1 tablet (5 mg total) by mouth 2 (two) times daily. 06/21/22   Adrian Prows, MD  atorvastatin (LIPITOR) 80 MG  tablet TAKE 1 TABLET BY MOUTH DAILY 09/05/21   Adrian Prows, MD  busPIRone (BUSPAR) 7.5 MG tablet Take 7.5 mg by mouth as needed.    [provider]  clonazePAM (KLONOPIN) 0.5 MG tablet Take 0.5 mg by mouth daily as needed for anxiety (stress).    [provider]  docusate sodium (COLACE) 100 MG capsule Take 100 mg by mouth 2 (two) times daily as needed (constipation).    [provider]  donepezil (ARICEPT) 23 MG TABS tablet Take 23 mg by mouth in the morning.    [provider]  losartan-hydrochlorothiazide (HYZAAR) 50-12.5 MG tablet Take 1 tablet by mouth daily. 05/23/22   [provider]  metoprolol tartrate (LOPRESSOR) 50 MG tablet TAKE 1 TABLET BY MOUTH TWICE DAILY Patient taking differently: Take 50 mg by mouth 2 (two) times daily. 11/02/21   Adrian Prows, MD  potassium chloride (MICRO-K) 10 MEQ CR capsule Take 10 mEq by mouth in the morning. 04/29/18   [provider]  Psyllium (METAMUCIL FIBER  PO) Take 1 Scoop by mouth 2 (two) times daily.     [provider]  tamsulosin (FLOMAX) 0.4 MG CAPS capsule Take 0.4 mg by mouth in the morning. 02/28/19   [provider]    Physical Exam: Vitals:   10/30/22 1238 10/30/22 1245 10/30/22 1300 10/30/22 1315  BP: (!) 117/92 (!) 117/92 (!) 122/58 (!) 137/99  Pulse: 65 63 61 61  Resp: 20 (!) 29 (!) 24 (!) 26  Temp: 98.7 F (37.1 C)     TempSrc: Oral     SpO2: 95% 91% 95% 91%  Weight:      Height:        Constitutional: NAD, calm, comfortable Vitals:   10/30/22 1238 10/30/22 1245 10/30/22 1300 10/30/22 1315  BP: (!) 117/92 (!) 117/92 (!) 122/58 (!) 137/99  Pulse: 65 63 61 61  Resp: 20 (!) 29 (!) 24 (!) 26  Temp: 98.7 F (37.1 C)     TempSrc: Oral     SpO2: 95% 91% 95% 91%  Weight:      Height:       Eyes: PERRL, lids and conjunctivae normal ENMT: Mucous membranes are moist. Posterior pharynx clear of any exudate or lesions.Normal dentition.  Neck: normal, supple, no  masses, no thyromegaly Respiratory: Diminished breathing sound bilaterally no crackles but diffused wheezing, increasing breathing effort.  No accessory muscle use.  Cardiovascular: Regular rate and rhythm, no murmurs / rubs / gallops. No extremity edema. 2+ pedal pulses. No carotid bruits.  Abdomen: no tenderness, no masses palpated. No hepatosplenomegaly. Bowel sounds positive.  Musculoskeletal: no clubbing / cyanosis. No joint deformity upper and lower extremities. Good ROM, no contractures. Normal muscle tone.  Skin: no rashes, lesions, ulcers. No induration Neurologic: CN 2-12 grossly intact. Sensation intact, DTR normal. Strength 5/5 in all 4.  Psychiatric: Normal judgment and insight. Alert and oriented x 3. Normal mood.     Labs on Admission: I have personally reviewed following labs and imaging studies  CBC: Recent Labs  Lab 10/30/22 1032  WBC 10.3  HGB 13.5  HCT 40.6  MCV 93.8  PLT 782*   Basic Metabolic Panel: Recent Labs  Lab 10/30/22 1032  NA 139  K 3.2*  CL 102  CO2 24  GLUCOSE 112*  BUN 17  CREATININE 1.35*  CALCIUM 8.5*   GFR: Estimated Creatinine Clearance: 65.1 mL/min (A) (by C-G formula based on SCr of 1.35 mg/dL (H)). Liver Function Tests: No results for input(s): "AST", "ALT", "ALKPHOS", "BILITOT", "PROT", "ALBUMIN" in the last 168 hours. No results for input(s): "LIPASE", "AMYLASE" in the last 168 hours. No results for input(s): "AMMONIA" in the last 168 hours. Coagulation Profile: No results for input(s): "INR", "PROTIME" in the last 168 hours. Cardiac Enzymes: No results for input(s): "CKTOTAL", "CKMB", "CKMBINDEX", "TROPONINI" in the last 168 hours. BNP (last 3 results) No results for input(s): "PROBNP" in the last 8760 hours. HbA1C: No results for input(s): "HGBA1C" in the last 72 hours. CBG: No results for input(s): "GLUCAP" in the last 168 hours. Lipid Profile: No results for input(s): "CHOL", "HDL", "LDLCALC", "TRIG", "CHOLHDL",  "LDLDIRECT" in the last 72 hours. Thyroid Function Tests: No results for input(s): "TSH", "T4TOTAL", "FREET4", "T3FREE", "THYROIDAB" in the last 72 hours. Anemia Panel: No results for input(s): "VITAMINB12", "FOLATE", "FERRITIN", "TIBC", "IRON", "RETICCTPCT" in the last 72 hours. Urine analysis:    Component Value Date/Time   COLORURINE YELLOW 09/06/2017 [place] 09/06/2017 1548   LABSPEC 1.017 09/06/2017 1548  PHURINE 5.0 09/06/2017 [place] 09/06/2017 [place] 09/06/2017 [place] 09/06/2017 [place] 09/06/2017 1548   PROTEINUR NEGATIVE 09/06/2017 1548   NITRITE NEGATIVE 09/06/2017 1548   LEUKOCYTESUR NEGATIVE 09/06/2017 1548    Radiological Exams on Admission: DG Chest 2 View  Result Date: 10/30/2022 CLINICAL DATA:  Provided history: Shortness of breath. EXAM: CHEST - 2 VIEW COMPARISON:  Chest radiographs 12/01/2021 and earlier. FINDINGS: Mild cardiomegaly, unchanged. Patchy airspace opacities bilaterally (right greater than left). No evidence of pleural effusion or pneumothorax. No acute bony abnormality identified. Multilevel ventrolateral osteophytes within the thoracic spine. IMPRESSION: Patchy airspace opacities bilaterally (right greater than left), most suggestive multifocal pneumonia. Recommend radiographic follow-up to complete resolution to exclude any underlying malignancy. Mild cardiomegaly. Electronically Signed   By: Kellie Simmering D.O.   On: 10/30/2022 11:40    EKG: Independently reviewed.  Sinus rhythm, chronic ST-T changes on multiple leads as before.  Assessment/Plan Principal Problem:   PNA (pneumonia) Active Problems:   Multifocal pneumonia  (please populate well all problems here in Problem List. (For example, if patient is on BP meds at home and you resume or decide to hold them, it is a problem that needs to be her. Same for CAD, COPD, HLD and so on)  Acute hypoxic respiratory  failure, CURB65=1 -We will check ABG to rule out CO2 retention -Etiology secondary to multifocal pneumonia, post COVID PNA versus aspiration PNA bacterial -Check sputum culture, check atypical pneumonia study including Legionella and mycoplasma -Will check CT chest to rule out any PNA related complications such as abscess versus empyema. -For antibiotic coverage, continue ceftriaxone and azithromycin -Breathing treatment, trial of inhaled steroid, no history of COPD or asthma -Speech evaluation -Incentive spirometry and flutter valve -Ambulatory pulse ox -Procalcitonin  Rectal bleed -No bleeding today -H/H at baseline, and bleeding has been intermittent, decided to hold Eliquis tonight.  Recheck H&H tonight and tomorrow morning.  If rectal bleeding is self-limiting, likely can resume Eliquis tomorrow otherwise consider GI consult.  HTN -Blood pressure stable, continue home BP meds  oSA -Not tolerating home CPAP device -Nocturnal oxygen, outpatient follow-up with pulmonology  PAF -In sinus rhythm, hold off Eliquis, recheck H&H tonight and in AM then resume Eliquis tomorrow.  Hx of duodenal adenoma s/p ampullectomy 2021 -Outpatient follow-up with GI  Hypokalemia -Acute on chronic, po replacement and recheck electrolytes in AM.  Dementia -Mentation at baseline, continue Aricept  Morbid obesity -BMI=43, calorie control recommended.  DVT prophylaxis: Elquis on hold Code Status: Full code Family Communication: Wife and daughter at bedside Disposition Plan: Patient sick with acute hypoxia and multifocal pneumonia, requiring inpatient antibiotics treatment, expect more than 2 midnight hospital stay. Consults called: None Admission status: Tele admit   Lequita Halt MD [facility] Pager [phone]  10/30/2022, 1:48 PM

## 2022-10-30 NOTE — ED Notes (Signed)
ED TO INPATIENT HANDOFF REPORT  ED Nurse Name and Phone #: Iona Coach Name/Age/Gender Justin Davidson 76 y.o. male Room/Bed: 043C/043C  Code Status   Code Status: Full Code  Home/SNF/Other Home Patient oriented to: self, place, time, and situation Is this baseline? Yes   Triage Complete: Triage complete  Chief Complaint PNA (pneumonia) [J18.9]  Triage Note Pt. Stated, Justin Davidson had upper respiratory problem for years and been to see lots of Doctors.I think it gets worse I can't sleep at night cause I can't breath.  I have been wiping blood and everytime I go theres blood. He had COVID 3 weeks ago and not eating good .   Allergies Allergies  Allergen Reactions   Lisinopril Cough    Level of Care/Admitting Diagnosis ED Disposition     ED Disposition  Admit   Condition  --   Comment  Hospital Area: [facility] [[id number]]  Level of Care: Telemetry Medical [104]  May admit patient to [facility] or [facility] if equivalent level of care is available:: No  Covid Evaluation: Asymptomatic - no recent exposure (last 10 days) testing not required  Diagnosis: PNA (pneumonia) [656812]  Admitting Physician: Lequita Halt [[id number]]  Attending Physician: Lequita Halt [[id number]]  Certification:: I certify this patient will need inpatient services for at least 2 midnights  Estimated Length of Stay: 2          B Medical/Surgery History Past Medical History:  Diagnosis Date   Anxiety    Colon cancer ([place])    Colon polyps    Coronary artery disease    Dementia ([place])    Dizziness and giddiness    GERD (gastroesophageal reflux disease)    Hypercholesteremia    Hyperglycemia    Hypertension    OSA (obstructive sleep apnea)    CPAP    Pre-diabetes    Prediabetes    diet controlled   Past Surgical History:  Procedure Laterality Date   ADRENALECTOMY Left 12/07/2020   Procedure: LEFT ADRENALECTOMY;  Surgeon: Janith Lima, MD;  Location: [facility];  Service:  Urology;  Laterality: Left;   BIOPSY  09/13/2020   Procedure: BIOPSY;  Surgeon: Rush Landmark Telford Nab., MD;  Location: [facility];  Service: Gastroenterology;;   cardaic stents     CARDIAC CATHETERIZATION  10/28/2010   CHOLECYSTECTOMY N/A 12/07/2020   Procedure: CHOLECYSTECTOMY;  Surgeon: Dwan Bolt, MD;  Location: [facility];  Service: General;  Laterality: N/A;   COLONOSCOPY     ESOPHAGOGASTRODUODENOSCOPY (EGD) WITH PROPOFOL N/A 04/19/2020   Procedure: ESOPHAGOGASTRODUODENOSCOPY (EGD) WITH PROPOFOL;  Surgeon: Clarene Essex, MD;  Location: [facility];  Service: Endoscopy;  Laterality: N/A;   ESOPHAGOGASTRODUODENOSCOPY (EGD) WITH PROPOFOL N/A 09/13/2020   Procedure: ESOPHAGOGASTRODUODENOSCOPY (EGD) WITH PROPOFOL;  Surgeon: Rush Landmark Telford Nab., MD;  Location: [facility];  Service: Gastroenterology;  Laterality: N/A;   ESOPHAGOGASTRODUODENOSCOPY (EGD) WITH PROPOFOL N/A 05/31/2021   Procedure: ESOPHAGOGASTRODUODENOSCOPY (EGD) WITH PROPOFOL;  Surgeon: Clarene Essex, MD;  [facility];  Service: Endoscopy;  Laterality: N/A;  please have side viewing scope available   EUS  09/13/2020   Procedure: UPPER ENDOSCOPIC ULTRASOUND (EUS) RADIAL;  Surgeon: Irving Copas., MD;  Location: [facility];  Service: Gastroenterology;;   HOT HEMOSTASIS N/A 04/19/2020   Procedure: HOT HEMOSTASIS (ARGON PLASMA COAGULATION/BICAP);  Surgeon: Clarene Essex, MD;  [facility];  Service: Endoscopy;  Laterality: N/A;   NEPHRECTOMY Left 12/07/2020   Procedure: OPEN LEFT RADICAL NEPHRECTOMY;  Surgeon: Janith Lima, MD;  [facility];  Service: Urology;  Laterality: Left;   PARTIAL COLECTOMY  1996   colon cancer   POLYPECTOMY  05/31/2021   Procedure: POLYPECTOMY;  Surgeon: Clarene Essex, MD;  [facility];  Service: Endoscopy;;   WHIPPLE PROCEDURE N/A 12/07/2020   Procedure: OPEN TRANSDUODENAL AMPULLECTOMY;  Surgeon: Dwan Bolt, MD;  Location: [facility];  Service: General;   Laterality: N/A;     A IV Location/Drains/Wounds Patient Lines/Drains/Airways Status     Active Line/Drains/Airways     Name Placement date Placement time Site Days   Peripheral IV 10/30/22 20 G Left Antecubital 10/30/22  1203  Antecubital  less than 1   Incision (Closed) 12/07/20 Abdomen Other (Comment) 12/07/20  1135  -- 692            Intake/Output Last 24 hours  Intake/Output Summary (Last 24 hours) at 10/30/2022 1816 Last data filed at 10/30/2022 1430 Gross per 24 hour  Intake 250.04 ml  Output --  Net 250.04 ml    Labs/Imaging Results for orders placed or performed during the hospital encounter of 10/30/22 (from the past 48 hour(s))  Basic metabolic panel     Status: Abnormal   Collection Time: 10/30/22 10:32 AM  Result Value Ref Range   Sodium 139 135 - 145 mmol/L   Potassium 3.2 (L) 3.5 - 5.1 mmol/L   Chloride 102 98 - 111 mmol/L   CO2 24 22 - 32 mmol/L   Glucose, Bld 112 (H) 70 - 99 mg/dL    Comment: Glucose reference range applies only to samples taken after fasting for at least 8 hours.   BUN 17 8 - 23 mg/dL   Creatinine, Ser 1.35 (H) 0.61 - 1.24 mg/dL   Calcium 8.5 (L) 8.9 - 10.3 mg/dL   GFR, Estimated 54 (L) >60 mL/min    Comment: (NOTE) Calculated using the CKD-EPI Creatinine Equation (2021)    Anion gap 13 5 - 15    Comment: Performed at [facility] [address], [place] [address]  CBC     Status: Abnormal   Collection Time: 10/30/22 10:32 AM  Result Value Ref Range   WBC 10.3 4.0 - 10.5 K/uL   RBC 4.33 4.22 - 5.81 MIL/uL   Hemoglobin 13.5 13.0 - 17.0 g/dL   HCT 40.6 39.0 - 52.0 %   MCV 93.8 80.0 - 100.0 fL   MCH 31.2 26.0 - 34.0 pg   MCHC 33.3 30.0 - 36.0 g/dL   RDW 13.8 11.5 - 15.5 %   Platelets 429 (H) 150 - 400 K/uL   nRBC 0.2 0.0 - 0.2 %    Comment: Performed at [facility], [place] [address], [place], [place] [address]  Troponin I (High Sensitivity)     Status: Abnormal   Collection Time: 10/30/22 10:32 AM   Result Value Ref Range   Troponin I (High Sensitivity) 43 (H) <18 ng/L    Comment: (NOTE) Elevated high sensitivity troponin I (hsTnI) values and significant  changes across serial measurements may suggest ACS but many other  chronic and acute conditions are known to elevate hsTnI results.  Refer to the "Links" section for chest pain algorithms and additional  guidance. Performed at [facility], [place] [address], [place], [place] [address]   Brain natriuretic peptide     Status: None   Collection Time: 10/30/22 10:32 AM  Result Value Ref Range   B Natriuretic Peptide 43.9 0.0 - 100.0 pg/mL  Comment: Performed at [facility], [place] [address], [place], [place] [address]  Troponin I (High Sensitivity)     Status: Abnormal   Collection Time: 10/30/22 12:07 PM  Result Value Ref Range   Troponin I (High Sensitivity) 46 (H) <18 ng/L    Comment: (NOTE) Elevated high sensitivity troponin I (hsTnI) values and significant  changes across serial measurements may suggest ACS but many other  chronic and acute conditions are known to elevate hsTnI results.  Refer to the "Links" section for chest pain algorithms and additional  guidance. Performed at [facility], [place] [address], [place], [place] [address]   Lactic acid, plasma     Status: None   Collection Time: 10/30/22 12:07 PM  Result Value Ref Range   Lactic Acid, Venous 1.8 0.5 - 1.9 mmol/L    Comment: Performed at [facility] [address], [place], [place] [address]  Expectorated Sputum Assessment w Gram Stain, Rflx to Resp Cult     Status: None   Collection Time: 10/30/22  1:56 PM   Specimen: Expectorated Sputum  Result Value Ref Range   Specimen Description EXPECTORATED SPUTUM    Special Requests NONE    Sputum evaluation      Sputum specimen not acceptable for testing.  Please recollect.   RN Genia Hotter ON 10/30/22 @ 1720 BY DRT Performed at [facility], [place] [address] [place], [place]  [address]    Report Status 10/30/2022 FINAL   I-Stat arterial blood gas, ED     Status: Abnormal   Collection Time: 10/30/22  2:59 PM  Result Value Ref Range   pH, Arterial 7.440 7.35 - 7.45   pCO2 arterial 39.1 32 - 48 mmHg   pO2, Arterial 57 (L) 83 - 108 mmHg   Bicarbonate 26.6 20.0 - 28.0 mmol/L   TCO2 28 22 - 32 mmol/L   O2 Saturation 90 %   Acid-Base Excess 2.0 0.0 - 2.0 mmol/L   Sodium 140 135 - 145 mmol/L   Potassium 3.3 (L) 3.5 - 5.1 mmol/L   Calcium, Ion 1.17 1.15 - 1.40 mmol/L   HCT 37.0 (L) 39.0 - 52.0 %   Hemoglobin 12.6 (L) 13.0 - 17.0 g/dL   Patient temperature 98.7 F    Collection site RADIAL, ALLEN'S TEST ACCEPTABLE    Drawn by RT    Sample type ARTERIAL    CT CHEST WO CONTRAST  Result Date: 10/30/2022 CLINICAL DATA:  Pneumonia EXAM: CT CHEST WITHOUT CONTRAST TECHNIQUE: Multidetector CT imaging of the chest was performed following the standard protocol without IV contrast. RADIATION DOSE REDUCTION: This exam was performed according to the departmental dose-optimization program which includes automated exposure control, adjustment of the mA and/or kV according to patient size and/or use of iterative reconstruction technique. COMPARISON:  None Available. FINDINGS: Cardiovascular: Coronary artery calcification and aortic atherosclerotic calcification. Mediastinum/Nodes: No axillary or supraclavicular adenopathy. No mediastinal or hilar adenopathy. No pericardial fluid. Esophagus normal. Lungs/Pleura: Multiple foci of dense airspace disease in the lower lobes and upper lobes. Somewhat peripheral distribution. No pleural fluid. No empyema. No abscess. Upper Abdomen: Limited view of the liver, kidneys, pancreas are unremarkable. Normal adrenal glands. Musculoskeletal: No aggressive osseous lesion. IMPRESSION: Multifocal dense airspace disease without effusion or adenopathy. Favor COVID pneumonia. Electronically Signed   By: Suzy Bouchard M.D.   On: 10/30/2022 14:47   DG Chest 2  View  Result Date: 10/30/2022 CLINICAL DATA:  Provided history: Shortness of breath.  EXAM: CHEST - 2 VIEW COMPARISON:  Chest radiographs 12/01/2021 and earlier. FINDINGS: Mild cardiomegaly, unchanged. Patchy airspace opacities bilaterally (right greater than left). No evidence of pleural effusion or pneumothorax. No acute bony abnormality identified. Multilevel ventrolateral osteophytes within the thoracic spine. IMPRESSION: Patchy airspace opacities bilaterally (right greater than left), most suggestive multifocal pneumonia. Recommend radiographic follow-up to complete resolution to exclude any underlying malignancy. Mild cardiomegaly. Electronically Signed   By: Kellie Simmering D.O.   On: 10/30/2022 11:40    Pending Labs Unresulted Labs (From admission, onward)     Start     Ordered   10/31/22 0500  Procalcitonin  Daily at 5am,   R      10/30/22 1304   10/31/22 0500  CBC  Tomorrow morning,   R        10/30/22 1346   10/31/22 1941  Basic metabolic panel  Tomorrow morning,   R        10/30/22 1346   10/30/22 2000  Hemoglobin and hematocrit, blood  Once-Timed,   TIMED        10/30/22 1346   10/30/22 1305  Procalcitonin - Baseline  ONCE - URGENT,   URGENT        10/30/22 1304   10/30/22 1305  Legionella Pneumophila Serogp 1 Ur Ag  Once,   URGENT        10/30/22 1304   10/30/22 1305  Mycoplasma pneumoniae antibody, IgM  Once,   URGENT        10/30/22 1304   10/30/22 1305  Strep pneumoniae urinary antigen  Once,   URGENT        10/30/22 1304   10/30/22 1304  Blood gas, arterial  Once,   R        10/30/22 1303   10/30/22 1201  Blood culture (routine x 2)  BLOOD CULTURE X 2,   R (with STAT occurrences)      10/30/22 1200   10/30/22 1201  Lactic acid, plasma  Now then every 2 hours,   R (with STAT occurrences)      10/30/22 1200            Vitals/Pain Today's Vitals   10/30/22 1245 10/30/22 1300 10/30/22 1315 10/30/22 1630  BP: (!) 117/92 (!) 122/58 (!) 137/99 (!) 131/50  Pulse: 63 61  61 66  Resp: (!) 29 (!) 24 (!) 26 16  Temp:      TempSrc:      SpO2: 91% 95% 91% 92%  Weight:      Height:      PainSc:        Isolation Precautions No active isolations  Medications Medications  azithromycin (ZITHROMAX) tablet 500 mg (has no administration in time range)  cefTRIAXone (ROCEPHIN) 2 g in sodium chloride 0.9 % 100 mL IVPB (has no administration in time range)  ipratropium-albuterol (DUONEB) 0.5-2.5 (3) MG/3ML nebulizer solution 3 mL (3 mLs Nebulization Given 10/30/22 1418)  albuterol (PROVENTIL) (2.5 MG/3ML) 0.083% nebulizer solution 6 mL (has no administration in time range)  guaiFENesin (MUCINEX) 12 hr tablet 1,200 mg (1,200 mg Oral Given 10/30/22 1417)  acetaminophen (TYLENOL) tablet 500 mg (has no administration in time range)  amLODipine (NORVASC) tablet 10 mg (has no administration in time range)  atorvastatin (LIPITOR) tablet 80 mg (has no administration in time range)  metoprolol tartrate (LOPRESSOR) tablet 50 mg (has no administration in time range)  donepezil (ARICEPT) tablet 23 mg (has no administration in time range)  docusate sodium (  COLACE) capsule 100 mg (has no administration in time range)  psyllium (HYDROCIL/METAMUCIL) 1 packet (has no administration in time range)  tamsulosin (FLOMAX) capsule 0.4 mg (has no administration in time range)  clonazePAM (KLONOPIN) tablet 0.5 mg (has no administration in time range)  potassium chloride SA (KLOR-CON M) CR tablet 20 mEq (has no administration in time range)  benzonatate (TESSALON) capsule 200 mg (has no administration in time range)  apixaban (ELIQUIS) tablet 5 mg (has no administration in time range)  budesonide (PULMICORT) nebulizer solution 0.25 mg (has no administration in time range)  losartan (COZAAR) tablet 50 mg (has no administration in time range)    And  hydrochlorothiazide (HYDRODIURIL) tablet 12.5 mg (has no administration in time range)  cefTRIAXone (ROCEPHIN) 2 g in sodium chloride 0.9 % 100  mL IVPB (0 g Intravenous Stopped 10/30/22 1314)  azithromycin (ZITHROMAX) 500 mg in sodium chloride 0.9 % 250 mL IVPB (0 mg Intravenous Stopped 10/30/22 1430)    Mobility walks Low fall risk   Focused Assessments    R Recommendations: See Admitting Provider Note  Report given to:   Additional Notes:

## 2022-10-30 NOTE — ED Provider Notes (Signed)
[facility] EMERGENCY DEPARTMENT Provider Note   CSN: [id number] Arrival date & time: 10/30/22  7829     History  Chief Complaint  Patient presents with   Shortness of Breath   Rectal Bleeding   Nasal Congestion    Mandrell Vangilder is a 76 y.o. male.  76 yo M with a cc of sob.  Going on for months.  Has been worse since he was diagnosed with COVID about 2 weeks ago.  Since then has had persistent coughing sputum production especially worse when he tries to lay down at night.  He is supposed to be on CPAP but does not tolerate it.  For the past 2 to 3 days of also noticed some bright red blood in his undershorts.  No obvious bloody bowel movements.  He does take Metamucil but they think he is more likely dehydrated.   Shortness of Breath Rectal Bleeding      Home Medications Prior to Admission medications   Medication Sig Start Date End Date Taking? Authorizing Provider  acetaminophen (TYLENOL) 500 MG tablet Take 500 mg by mouth every 6 (six) hours as needed.    [provider]  amLODipine (NORVASC) 10 MG tablet Take 1 tablet (10 mg total) by mouth daily. 11/10/20   Adrian Prows, MD  apixaban (ELIQUIS) 5 MG TABS tablet Take 1 tablet (5 mg total) by mouth 2 (two) times daily. 06/21/22   Adrian Prows, MD  atorvastatin (LIPITOR) 80 MG tablet TAKE 1 TABLET BY MOUTH DAILY 09/05/21   Adrian Prows, MD  busPIRone (BUSPAR) 7.5 MG tablet Take 7.5 mg by mouth as needed.    [provider]  clonazePAM (KLONOPIN) 0.5 MG tablet Take 0.5 mg by mouth daily as needed for anxiety (stress).    [provider]  docusate sodium (COLACE) 100 MG capsule Take 100 mg by mouth 2 (two) times daily as needed (constipation).    [provider]  donepezil (ARICEPT) 23 MG TABS tablet Take 23 mg by mouth in the morning.    [provider]  losartan-hydrochlorothiazide (HYZAAR) 50-12.5 MG tablet Take 1 tablet by mouth daily. 05/23/22   [provider]   metoprolol tartrate (LOPRESSOR) 50 MG tablet TAKE 1 TABLET BY MOUTH TWICE DAILY Patient taking differently: Take 50 mg by mouth 2 (two) times daily. 11/02/21   Adrian Prows, MD  potassium chloride (MICRO-K) 10 MEQ CR capsule Take 10 mEq by mouth in the morning. 04/29/18   [provider]  Psyllium (METAMUCIL FIBER PO) Take 1 Scoop by mouth 2 (two) times daily.     [provider]  tamsulosin (FLOMAX) 0.4 MG CAPS capsule Take 0.4 mg by mouth in the morning. 02/28/19   [provider]      Allergies    Lisinopril    Review of Systems   Review of Systems  Respiratory:  Positive for shortness of breath.   Gastrointestinal:  Positive for hematochezia.    Physical Exam Updated Vital Signs BP (!) 109/58 (BP Location: Right Arm)   Pulse (!) 58   Temp 99 F (37.2 C) (Oral)   Resp (!) 23   Ht '5\' 11"'$  (1.803 m)   Wt (!) 142 kg   SpO2 94%   BMI 43.65 kg/m  Physical Exam Vitals and nursing note reviewed.  Constitutional:      Appearance: He is well-developed.     Comments: BMI 44  HENT:     Head: Normocephalic and atraumatic.  Eyes:  Pupils: Pupils are equal, round, and reactive to light.  Neck:     Vascular: No JVD.  Cardiovascular:     Rate and Rhythm: Normal rate and regular rhythm.     Heart sounds: No murmur heard.    No friction rub. No gallop.  Pulmonary:     Effort: No respiratory distress.     Breath sounds: No wheezing.     Comments: Diminished sounds in all fields Abdominal:     General: There is no distension.     Tenderness: There is no abdominal tenderness. There is no guarding or rebound.  Musculoskeletal:        General: Normal range of motion.     Cervical back: Normal range of motion and neck supple.     Right lower leg: Edema present.     Left lower leg: Edema present.     Comments: 1+ up to the shins  Skin:    Coloration: Skin is not pale.     Findings: No rash.  Neurological:     Mental Status: He is alert and oriented to  person, place, and time.  Psychiatric:        Behavior: Behavior normal.     ED Results / Procedures / Treatments   Labs (all labs ordered are listed, but only abnormal results are displayed) Labs Reviewed  BASIC METABOLIC PANEL - Abnormal; Notable for the following components:      Result Value   Potassium 3.2 (*)    Glucose, Bld 112 (*)    Creatinine, Ser 1.35 (*)    Calcium 8.5 (*)    GFR, Estimated 54 (*)    All other components within normal limits  CBC - Abnormal; Notable for the following components:   Platelets 429 (*)    All other components within normal limits  TROPONIN I (HIGH SENSITIVITY) - Abnormal; Notable for the following components:   Troponin I (High Sensitivity) 43 (*)    All other components within normal limits  CULTURE, BLOOD (ROUTINE X 2)  CULTURE, BLOOD (ROUTINE X 2)  BRAIN NATRIURETIC PEPTIDE  LACTIC ACID, PLASMA  LACTIC ACID, PLASMA  TROPONIN I (HIGH SENSITIVITY)    EKG EKG Interpretation  Date/Time:  Monday October 30 2022 10:21:58 EST Ventricular Rate:  62 PR Interval:  166 QRS Duration: 78 QT Interval:  422 QTC Calculation: 428 R Axis:   45 Text Interpretation: Normal sinus rhythm Nonspecific T wave abnormality Abnormal ECG No significant change since last tracing Confirmed by Deno Etienne [id number]) on 10/30/2022 11:58:46 AM  Radiology DG Chest 2 View  Result Date: 10/30/2022 CLINICAL DATA:  Provided history: Shortness of breath. EXAM: CHEST - 2 VIEW COMPARISON:  Chest radiographs 12/01/2021 and earlier. FINDINGS: Mild cardiomegaly, unchanged. Patchy airspace opacities bilaterally (right greater than left). No evidence of pleural effusion or pneumothorax. No acute bony abnormality identified. Multilevel ventrolateral osteophytes within the thoracic spine. IMPRESSION: Patchy airspace opacities bilaterally (right greater than left), most suggestive multifocal pneumonia. Recommend radiographic follow-up to complete resolution to exclude any  underlying malignancy. Mild cardiomegaly. Electronically Signed   By: Kellie Simmering D.O.   On: 10/30/2022 11:40    Procedures .Critical Care  Performed by: Deno Etienne, DO Authorized by: Deno Etienne, DO   Critical care provider statement:    Critical care time (minutes):  35   Critical care time was exclusive of:  Separately billable procedures and treating other patients   Critical care was time spent personally by me on the following activities:  Development of treatment plan with patient or surrogate, discussions with consultants, evaluation of patient's response to treatment, examination of patient, ordering and review of laboratory studies, ordering and review of radiographic studies, ordering and performing treatments and interventions, pulse oximetry, re-evaluation of patient's condition and review of old charts   Care discussed with: admitting provider       Medications Ordered in ED Medications  cefTRIAXone (ROCEPHIN) 2 g in sodium chloride 0.9 % 100 mL IVPB (has no administration in time range)  azithromycin (ZITHROMAX) 500 mg in sodium chloride 0.9 % 250 mL IVPB (has no administration in time range)    ED Course/ Medical Decision Making/ A&P                           Medical Decision Making  76 yo M with a chief complaints of difficulty breathing.  This has been going on for the past few months but worsening over the past couple weeks.  He was diagnosed with COVID at the onset.  Having worsening difficulty breathing mostly at night.  Was found to be newly hypoxic here with an oxygen saturation in the mid 80s at rest.  Has some mild tachypnea.  Coarse breath sounds in all fields for me.  Does have some pitting edema up to the shins bilaterally.  BNP is sent off.  Chest x-ray concerning for possible multifocal pneumonia.  With new oxygen requirement will start him on antibiotics.  Will discuss with medicine for admission.  The patients results and plan were reviewed and discussed.    Any x-rays performed were independently reviewed by myself.   Differential diagnosis were considered with the presenting HPI.  Medications  cefTRIAXone (ROCEPHIN) 2 g in sodium chloride 0.9 % 100 mL IVPB (has no administration in time range)  azithromycin (ZITHROMAX) 500 mg in sodium chloride 0.9 % 250 mL IVPB (has no administration in time range)    Vitals:   10/30/22 1004 10/30/22 1011 10/30/22 1154  BP:  (!) 109/58   Pulse:  (!) 58 (!) 58  Resp:  19 (!) 23  Temp:  99 F (37.2 C)   TempSrc:  Oral   SpO2:  91% 94%  Weight: (!) 142 kg    Height: '5\' 11"'$  (1.803 m)      Final diagnoses:  Acute on chronic respiratory failure with hypoxia (HCC)  Multifocal pneumonia    Admission/ observation were discussed with the admitting physician, patient and/or family and they are comfortable with the plan.          Final Clinical Impression(s) / ED Diagnoses Final diagnoses:  Acute on chronic respiratory failure with hypoxia [facility])  Multifocal pneumonia    Rx / DC Orders ED Discharge Orders     None         Deno Etienne, DO 10/30/22 1204

## 2022-10-31 LAB — BASIC METABOLIC PANEL
Anion gap: 11 (ref 5–15)
BUN: 17 mg/dL (ref 8–23)
CO2: 27 mmol/L (ref 22–32)
Calcium: 8.6 mg/dL — ABNORMAL LOW (ref 8.9–10.3)
Chloride: 99 mmol/L (ref 98–111)
Creatinine, Ser: 1.22 mg/dL (ref 0.61–1.24)
GFR, Estimated: >60 mL/min (ref >60)
Glucose, Bld: 100 mg/dL — ABNORMAL HIGH (ref 70–99)
Potassium: 3.3 mmol/L — ABNORMAL LOW (ref 3.5–5.1)
Sodium: 137 mmol/L (ref 135–145)

## 2022-10-31 LAB — RESPIRATORY PANEL BY PCR

## 2022-10-31 LAB — CBC
HCT: 38.5 % — ABNORMAL LOW (ref 39.0–52.0)
Hemoglobin: 12.9 g/dL — ABNORMAL LOW (ref 13.0–17.0)
MCH: 31.5 pg (ref 26.0–34.0)
MCHC: 33.5 g/dL (ref 30.0–36.0)
MCV: 93.9 fL (ref 80.0–100.0)
Platelets: 376 10*3/uL (ref 150–400)
RBC: 4.1 MIL/uL — ABNORMAL LOW (ref 4.22–5.81)
RDW: 13.9 % (ref 11.5–15.5)
WBC: 8.6 10*3/uL (ref 4.0–10.5)
nRBC: 0.2 % (ref 0.0–0.2)

## 2022-10-31 LAB — PROCALCITONIN: Procalcitonin: <0.1 ng/mL

## 2022-10-31 MED ORDER — ORAL CARE MOUTH RINSE: 15.0000 mL | OROMUCOSAL | Status: DC | PRN

## 2022-10-31 MED ORDER — ONDANSETRON HCL 4 MG/2ML IJ SOLN: 4.0000 mg | Freq: Four times a day (QID) | INTRAMUSCULAR | Status: DC | PRN

## 2022-10-31 MED ORDER — DM-GUAIFENESIN ER 30-600 MG PO TB12
1.0000 | ORAL_TABLET | Freq: Two times a day (BID) | ORAL | Status: DC
  Administered 2022-10-31 – 2022-11-02 (×5): 1 via ORAL
  Filled 2022-10-31 (×5): qty 1

## 2022-10-31 MED ORDER — HYDRALAZINE HCL 20 MG/ML IJ SOLN: 10.0000 mg | INTRAMUSCULAR | Status: DC | PRN

## 2022-10-31 MED ORDER — POTASSIUM CHLORIDE CRYS ER 20 MEQ PO TBCR
40.0000 meq | EXTENDED_RELEASE_TABLET | Freq: Once | ORAL | Status: AC
  Administered 2022-10-31: 40 meq via ORAL
  Filled 2022-10-31: qty 2

## 2022-10-31 MED ORDER — AZITHROMYCIN 500 MG PO TABS
500.0000 mg | ORAL_TABLET | Freq: Every day | ORAL | Status: DC
  Administered 2022-10-31 – 2022-11-02 (×3): 500 mg via ORAL
  Filled 2022-10-31 (×3): qty 1

## 2022-10-31 MED ORDER — GUAIFENESIN 100 MG/5ML PO LIQD: 5.0000 mL | ORAL | Status: DC | PRN

## 2022-10-31 MED ORDER — IPRATROPIUM-ALBUTEROL 0.5-2.5 (3) MG/3ML IN SOLN
3.0000 mL | Freq: Four times a day (QID) | RESPIRATORY_TRACT | Status: DC
  Administered 2022-10-31: 3 mL via RESPIRATORY_TRACT
  Filled 2022-10-31: qty 3

## 2022-10-31 MED ORDER — TRAZODONE HCL 50 MG PO TABS
50.0000 mg | ORAL_TABLET | Freq: Every evening | ORAL | Status: DC | PRN
  Filled 2022-10-31: qty 1

## 2022-10-31 MED ORDER — SENNOSIDES-DOCUSATE SODIUM 8.6-50 MG PO TABS: 1.0000 | ORAL_TABLET | Freq: Every evening | ORAL | Status: DC | PRN

## 2022-10-31 MED ORDER — IPRATROPIUM-ALBUTEROL 0.5-2.5 (3) MG/3ML IN SOLN: 3.0000 mL | Freq: Four times a day (QID) | RESPIRATORY_TRACT | Status: DC | PRN

## 2022-10-31 MED ORDER — METOPROLOL TARTRATE 5 MG/5ML IV SOLN: 5.0000 mg | INTRAVENOUS | Status: DC | PRN

## 2022-10-31 NOTE — Progress Notes (Signed)
Discussed with Dr. Reesa Chew.  Patient stable and no longer needing continuous cardiac monitoring.  Discontinued.

## 2022-10-31 NOTE — Progress Notes (Signed)
PROGRESS NOTE    Justin Davidson  BJY:[id number] DOB: 1946-01-03 DOA: 10/30/2022 PCP: Georgianne Fick, MD   Brief Narrative:  76 year old with history of dementia, HTN, HLD, OSA not tolerating CPAP, morbid obesity, duodenal adenoma status post transduodenal ampullectomy in 2021, P A-fib on Eliquis, CAD admitted for worsening cough.  Patient was diagnosed with COVID-19 with home care at about 3 weeks ago.  After this patient gradually became more short of breath with cough and congestion therefore brought him to the hospital.  Patient was noted to be hypoxic in the ED saturating 80%, chest x-ray showing signs of multifocal pneumonia.  He was started on Rocephin and azithromycin.   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  BNP, procalcitonin negative.  Strep pneumonia urine antigen is also negative.  Will check for flu, respiratory panel.  CT chest without contrast shows multifocal with dense airspace. - Stop Rocephin, cont Azithroymcin (Day 2/5) - Bronchodilators, I-S/flutter valve.   Rectal bleed; resolved.  -Possible hemorrhoid bleed.  This appears to have subsided, hemoglobin is stable.  Will resume home Eliquis  Hypokalemia - Repletion   HTN -Continue home Norvasc, hydrochlorothiazide, losartan, metoprolol.  IV as needed ordered   OSA -Noncompliant with outpatient CPAP   Paroxysmal atrial fibrillation - Continue home Lopressor, Eliquis.  IV as needed ordered   Hx of duodenal adenoma s/p ampullectomy 2021 -Outpatient follow-up with GI    Dementia -On home Aricept   Morbid obesity -BMI=43, calorie control recommended.   DVT prophylaxis: Eliquis Code Status: Full code Family Communication: Wife is at bedside  Status is: Inpatient Doing slightly better but still overall quite weak.  Maintain  hospital stay at least for 48 hours    Subjective: Seen and examined at bedside.  Still has quite a bit of coughing and exertional shortness of breath.  Tells me at baseline while at rest his breathing has improved since getting breathing treatments in the hospital.  Remains afebrile overnight   Examination:  Constitutional: Not in acute distress, 2 L nasal cannula Respiratory: Bilateral rhonchi, mild Cardiovascular: Normal sinus rhythm, no rubs Abdomen: Nontender nondistended good bowel sounds Musculoskeletal: No edema noted Skin: No rashes seen Neurologic: CN 2-12 grossly intact.  And nonfocal Psychiatric: Normal judgment and insight. Alert and oriented x 3. Normal mood.   Objective: Vitals:   10/30/22 2050 10/30/22 2109 10/31/22 0530 10/31/22 0735  BP: 97/78  97/78 (!) 135/52  Pulse:    66  Resp:      Temp: 98.2 F (36.8 C) 98.2 F (36.8 C) 98 F (36.7 C) 97.8 F (36.6 C)  TempSrc: Oral Oral Oral Oral  SpO2: 97%   93%  Weight:      Height:        Intake/Output Summary (Last 24 hours) at 10/31/2022 0826 Last data filed at 10/30/2022 1430 Gross per 24 hour  Intake 250.04 ml  Output --  Net 250.04 ml   Filed Weights   10/30/22 1004  Weight: (!) 142 kg     Data Reviewed:   CBC: Recent Labs  Lab 10/30/22 1032 10/30/22 1459 10/30/22 2106 10/31/22 0325  WBC 10.3  --   --  8.6  HGB 13.5 12.6* 13.1 12.9*  HCT 40.6 37.0* 39.4 38.5*  MCV 93.8  --   --  93.9  PLT 429*  --   --  376  Basic Metabolic Panel: Recent Labs  Lab 10/30/22 1032 10/30/22 1459 10/31/22 0325  NA 139 140 137  K 3.2* 3.3* 3.3*  CL 102  --  99  CO2 24  --  27  GLUCOSE 112*  --  100*  BUN 17  --  17  CREATININE 1.35*  --  1.22  CALCIUM 8.5*  --  8.6*   GFR: Estimated Creatinine Clearance: 72 mL/min (by C-G formula based on SCr of 1.22 mg/dL). Liver Function Tests: No results for input(s): "AST", "ALT", "ALKPHOS", "BILITOT", "PROT", "ALBUMIN" in the last 168 hours. No  results for input(s): "LIPASE", "AMYLASE" in the last 168 hours. No results for input(s): "AMMONIA" in the last 168 hours. Coagulation Profile: No results for input(s): "INR", "PROTIME" in the last 168 hours. Cardiac Enzymes: No results for input(s): "CKTOTAL", "CKMB", "CKMBINDEX", "TROPONINI" in the last 168 hours. BNP (last 3 results) No results for input(s): "PROBNP" in the last 8760 hours. HbA1C: No results for input(s): "HGBA1C" in the last 72 hours. CBG: No results for input(s): "GLUCAP" in the last 168 hours. Lipid Profile: No results for input(s): "CHOL", "HDL", "LDLCALC", "TRIG", "CHOLHDL", "LDLDIRECT" in the last 72 hours. Thyroid Function Tests: No results for input(s): "TSH", "T4TOTAL", "FREET4", "T3FREE", "THYROIDAB" in the last 72 hours. Anemia Panel: No results for input(s): "VITAMINB12", "FOLATE", "FERRITIN", "TIBC", "IRON", "RETICCTPCT" in the last 72 hours. Sepsis Labs: Recent Labs  Lab 10/30/22 1207 10/30/22 2106 10/31/22 0325  PROCALCITON  --  <0.10 <0.10  LATICACIDVEN 1.8 2.0*  --     Recent Results (from the past 240 hour(s))  Blood culture (routine x 2)     Status: None (Preliminary result)   Collection Time: 10/30/22 12:07 PM   Specimen: BLOOD  Result Value Ref Range Status   Specimen Description BLOOD LEFT ANTECUBITAL  Final   Special Requests   Final    BOTTLES DRAWN AEROBIC AND ANAEROBIC Blood Culture adequate volume   Culture   Final    NO GROWTH < 24 HOURS Performed at [facility], [address], [place], [place] [address]    Report Status PENDING  Incomplete  Expectorated Sputum Assessment w Gram Stain, Rflx to Resp Cult     Status: None   Collection Time: 10/30/22  1:56 PM   Specimen: Expectorated Sputum  Result Value Ref Range Status   Specimen Description EXPECTORATED SPUTUM  Final   Special Requests NONE  Final   Sputum evaluation   Final    Sputum specimen not acceptable for testing.  Please recollect.   RN Marylou Flesher ON  10/30/22 @ 1720 BY DRT Performed at [facility], [address], [place], [place] [address]    Report Status 10/30/2022 FINAL  Final  Blood culture (routine x 2)     Status: None (Preliminary result)   Collection Time: 10/30/22  9:06 PM   Specimen: BLOOD RIGHT HAND  Result Value Ref Range Status   Specimen Description BLOOD RIGHT HAND  Final   Special Requests   Final    BOTTLES DRAWN AEROBIC ONLY Blood Culture results may not be optimal due to an inadequate volume of blood received in culture bottles   Culture   Final    NO GROWTH < 12 HOURS Performed at [facility], [address], [place], [place] [address]    Report Status PENDING  Incomplete         Radiology Studies: CT CHEST WO CONTRAST  Result Date: 10/30/2022 CLINICAL  DATA:  Pneumonia EXAM: CT CHEST WITHOUT CONTRAST TECHNIQUE: Multidetector CT imaging of the chest was performed following the standard protocol without IV contrast. RADIATION DOSE REDUCTION: This exam was performed according to the departmental dose-optimization program which includes automated exposure control, adjustment of the mA and/or kV according to patient size and/or use of iterative reconstruction technique. COMPARISON:  None Available. FINDINGS: Cardiovascular: Coronary artery calcification and aortic atherosclerotic calcification. Mediastinum/Nodes: No axillary or supraclavicular adenopathy. No mediastinal or hilar adenopathy. No pericardial fluid. Esophagus normal. Lungs/Pleura: Multiple foci of dense airspace disease in the lower lobes and upper lobes. Somewhat peripheral distribution. No pleural fluid. No empyema. No abscess. Upper Abdomen: Limited view of the liver, kidneys, pancreas are unremarkable. Normal adrenal glands. Musculoskeletal: No aggressive osseous lesion. IMPRESSION: Multifocal dense airspace disease without effusion or adenopathy. Favor COVID pneumonia. Electronically Signed   By: Genevive Bi M.D.   On: 10/30/2022  14:47   DG Chest 2 View  Result Date: 10/30/2022 CLINICAL DATA:  Provided history: Shortness of breath. EXAM: CHEST - 2 VIEW COMPARISON:  Chest radiographs 12/01/2021 and earlier. FINDINGS: Mild cardiomegaly, unchanged. Patchy airspace opacities bilaterally (right greater than left). No evidence of pleural effusion or pneumothorax. No acute bony abnormality identified. Multilevel ventrolateral osteophytes within the thoracic spine. IMPRESSION: Patchy airspace opacities bilaterally (right greater than left), most suggestive multifocal pneumonia. Recommend radiographic follow-up to complete resolution to exclude any underlying malignancy. Mild cardiomegaly. Electronically Signed   By: Jackey Loge D.O.   On: 10/30/2022 11:40        Scheduled Meds:  amLODipine  10 mg Oral Daily   apixaban  5 mg Oral BID   atorvastatin  80 mg Oral Daily   azithromycin  500 mg Oral Daily   budesonide (PULMICORT) nebulizer solution  0.25 mg Nebulization BID   donepezil  23 mg Oral q AM   guaiFENesin  1,200 mg Oral BID   losartan  50 mg Oral Daily   And   hydrochlorothiazide  12.5 mg Oral Daily   metoprolol tartrate  50 mg Oral BID   potassium chloride  20 mEq Oral Daily   psyllium  1 packet Oral BID   tamsulosin  0.4 mg Oral q AM   Continuous Infusions:  cefTRIAXone (ROCEPHIN)  IV       LOS: 1 day   Time spent= 35 mins    Shantoya Geurts Joline Maxcy, MD [facility]  If 7PM-7AM, please contact night-coverage  10/31/2022, 8:26 AM

## 2022-11-01 LAB — BASIC METABOLIC PANEL
Anion gap: 8 (ref 5–15)
BUN: 15 mg/dL (ref 8–23)
CO2: 25 mmol/L (ref 22–32)
Calcium: 8.7 mg/dL — ABNORMAL LOW (ref 8.9–10.3)
Chloride: 103 mmol/L (ref 98–111)
Creatinine, Ser: 1.22 mg/dL (ref 0.61–1.24)
GFR, Estimated: >60 mL/min (ref >60)
Glucose, Bld: 109 mg/dL — ABNORMAL HIGH (ref 70–99)
Potassium: 3.6 mmol/L (ref 3.5–5.1)
Sodium: 136 mmol/L (ref 135–145)

## 2022-11-01 LAB — CBC
HCT: 40.1 % (ref 39.0–52.0)
Hemoglobin: 12.9 g/dL — ABNORMAL LOW (ref 13.0–17.0)
MCH: 30.4 pg (ref 26.0–34.0)
MCHC: 32.2 g/dL (ref 30.0–36.0)
MCV: 94.6 fL (ref 80.0–100.0)
Platelets: 412 10*3/uL — ABNORMAL HIGH (ref 150–400)
RBC: 4.24 MIL/uL (ref 4.22–5.81)
RDW: 14 % (ref 11.5–15.5)
WBC: 9.3 10*3/uL (ref 4.0–10.5)
nRBC: 0 % (ref 0.0–0.2)

## 2022-11-01 LAB — MAGNESIUM: Magnesium: 2.1 mg/dL (ref 1.7–2.4)

## 2022-11-01 LAB — MYCOPLASMA PNEUMONIAE ANTIBODY, IGM: Mycoplasma pneumo IgM: <770 U/mL (ref 0–769)

## 2022-11-01 LAB — PROCALCITONIN: Procalcitonin: <0.1 ng/mL

## 2022-11-01 MED ORDER — POTASSIUM CHLORIDE CRYS ER 20 MEQ PO TBCR
40.0000 meq | EXTENDED_RELEASE_TABLET | Freq: Once | ORAL | Status: AC
  Administered 2022-11-01: 40 meq via ORAL
  Filled 2022-11-01: qty 2

## 2022-11-01 NOTE — Evaluation (Signed)
Physical Therapy Evaluation Patient Details Name: Justin Davidson MRN: [id number] DOB: 10-29-1946 Today's Date: 11/01/2022  History of Present Illness  76 yo male presents to [facility] on 11/20 with ShOB, cough due to PNA. Pt diagnosed with covid x3 weeks ago. PMH includes dementia, HTN, HLD, OSA not tolerating CPAP, morbid obesity, duodenal adenoma status post transduodenal ampullectomy 2021, PAF on Eliquis, CAD.  Clinical Impression   Pt presents with dyspnea on exertion with accompanying O2 desaturation and min impaired activity tolerance, otherwise is at baseline. Pt ambulated good hallway distance with seated rest break at midpoint, mostly for recovering dyspnea. Pt is mod I to supervision for all mobility. Pt requiring 3LO2 to maintain SpO2 >88%, RN notified. Pt has assist of wife as needed at d/c, PT recommending pt continue working with mobility specialists while acute, but no skilled PT needs at this time.   SATURATION QUALIFICATIONS: (This note is used to comply with regulatory documentation for home oxygen)  Patient Saturations on Room Air at Rest = 95%  Patient Saturations on Room Air while Ambulating = 83%  Patient Saturations on 3 Liters of oxygen while Ambulating = 88%  Please briefly explain why patient needs home oxygen: to maintain SPO2    Recommendations for follow up therapy are one component of a multi-disciplinary discharge planning process, led by the attending physician.  Recommendations may be updated based on patient status, additional functional criteria and insurance authorization.  Follow Up Recommendations No PT follow up      Assistance Recommended at Discharge PRN  Patient can return home with the following  Assistance with cooking/housework    Equipment Recommendations None recommended by PT  Recommendations for Other Services       Functional Status Assessment Patient has not had a recent decline in their functional status     Precautions /  Restrictions Precautions Precautions: Fall;Other (comment) Precaution Comments: monitor O2 Restrictions Weight Bearing Restrictions: No      Mobility  Bed Mobility Overal bed mobility: Needs Assistance Bed Mobility: Supine to Sit, Sit to Supine     Supine to sit: Supervision Sit to supine: Supervision        Transfers Overall transfer level: Needs assistance Equipment used: None Transfers: Sit to/from Stand Sit to Stand: Supervision           General transfer comment: for safety, slow to rise and cues for watching for lines/leads    Ambulation/Gait Ambulation/Gait assistance: Supervision Gait Distance (Feet): 90 Feet (x2 - seated rest break) Assistive device: None Gait Pattern/deviations: Step-through pattern, Decreased stride length, Staggering right, Staggering left Gait velocity: decr     General Gait Details: increased gait speed to an impulsive degree, PT encouraged to slow down for safety. SPO2 drop to 83% on RA after 90 ft gait, placed on 2LO2 progressing to 3LO2 to maintain SPO2 >88%.  Stairs            Wheelchair Mobility    Modified Rankin (Stroke Patients Only)       Balance Overall balance assessment: Mild deficits observed, not formally tested                                           Pertinent Vitals/Pain Pain Assessment Pain Assessment: Faces Faces Pain Scale: Hurts a little bit Pain Location: low back Pain Descriptors / Indicators: Sore Pain Intervention(s): Limited activity within patient's tolerance, Monitored during  session, Repositioned    Home Living Family/patient expects to be discharged to:: Private residence Living Arrangements: Spouse/significant other Available Help at Discharge: Family;Available 24 hours/day Type of Home: House Home Access: Stairs to enter   [employer] of Steps: 2-3   Home Layout: Two level;Bed/bath upstairs;Able to live on main level with bedroom/bathroom Home  Equipment: Rolling Walker (2 wheels) (RW is wife's) Additional Comments: pt reports he typically uses the bedroom upstairs but can stay downstairs if needed. reports handicapped accessible bathroom    Prior Function Prior Level of Function : Independent/Modified Independent             Mobility Comments: no use of AD for mobility ADLs Comments: Able to manage ADLs, enjoys classic cars     Hand Dominance   Dominant Hand: Right    Extremity/Trunk Assessment   Upper Extremity Assessment Upper Extremity Assessment: Defer to OT evaluation    Lower Extremity Assessment Lower Extremity Assessment: Overall WFL for tasks assessed    Cervical / Trunk Assessment Cervical / Trunk Assessment: Other exceptions Cervical / Trunk Exceptions: abdominal obesity  Communication   Communication: Other (comment) (minor garbled speech at times)  Cognition Arousal/Alertness: Awake/alert Behavior During Therapy: WFL for tasks assessed/performed, Impulsive Overall Cognitive Status: History of cognitive impairments - at baseline                                 General Comments: hx of dementia, follows directions though quick and impulsive with movements at times. Daughter reports this is baseline        General Comments      Exercises     Assessment/Plan    PT Assessment Patient does not need any further PT services  PT Problem List         PT Treatment Interventions      PT Goals (Current goals can be found in the Care Plan section)  Acute Rehab PT Goals PT Goal Formulation: With patient Time For Goal Achievement: 11/01/22 Potential to Achieve Goals: Good    Frequency       Co-evaluation               AM-PAC PT "6 Clicks" Mobility  Outcome Measure Help needed turning from your back to your side while in a flat bed without using bedrails?: A Little Help needed moving from lying on your back to sitting on the side of a flat bed without using bedrails?: A  Little Help needed moving to and from a bed to a chair (including a wheelchair)?: A Little Help needed standing up from a chair using your arms (e.g., wheelchair or bedside chair)?: A Little Help needed to walk in hospital room?: A Little Help needed climbing 3-5 steps with a railing? : A Lot 6 Click Score: 17    End of Session Equipment Utilized During Treatment: Oxygen Activity Tolerance: Patient tolerated treatment well Patient left: in bed;with call bell/phone within reach;with family/visitor present Nurse Communication: Mobility status PT Visit Diagnosis: Other abnormalities of gait and mobility (R26.89)    Time: 2130-8657 PT Time Calculation (min) (ACUTE ONLY): 20 min   Charges:   PT Evaluation $PT Eval Low Complexity: 1 Low          Analie Katzman S, PT DPT Acute Rehabilitation Services Pager [phone]  Office [phone]   Truddie Coco 11/01/2022, 3:54 PM

## 2022-11-01 NOTE — Progress Notes (Signed)
11/23 I spoke to the patient's daughter via telephone and explained the IM Letter ad received verbal acknowledgement and confirmation. The IM Letter will forwarded to the address that is on file.

## 2022-11-01 NOTE — Progress Notes (Signed)
PROGRESS NOTE    Justin Davidson  WGN:[id number] DOB: 1946/01/11 DOA: 10/30/2022 PCP: Georgianne Fick, MD   Brief Narrative:  76 year old with history of dementia, HTN, HLD, OSA not tolerating CPAP, morbid obesity, duodenal adenoma status post transduodenal ampullectomy in 2021, P A-fib on Eliquis, CAD admitted for worsening cough.  Patient was diagnosed with COVID-19 with home care at about 3 weeks ago.  After this patient gradually became more short of breath with cough and congestion therefore brought him to the hospital.  Patient was noted to be hypoxic in the ED saturating 80%, chest x-ray showing signs of multifocal pneumonia.  He was started on Rocephin and azithromycin.  Eventually Rocephin was discontinued due to negative procalcitonin.   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  BNP, procalcitonin negative.  Strep pneumonia urine antigen is also negative.  CT chest without contrast showing multifocal dense opacity and airspace.  Suspect post-COVID lung changes.  Will continue azithromycin (d3/5) but no need for Rocephin.  Flu and respiratory panel is also negative. - Bronchodilators, I-S/flutter valve.  Aggressive pulmonary toilet -Low suspicion for PE as patient is already on Eliquis   Rectal bleed; resolved.  -Possible hemorrhoid bleed.  Appears to have subsided, hemoglobin is stable.  On Eliquis  Hypokalemia - Repletion   HTN -Continue home Norvasc, hydrochlorothiazide, losartan, metoprolol.  IV as needed ordered   OSA -Noncompliant with outpatient CPAP   Paroxysmal atrial fibrillation - Continue home Lopressor, Eliquis.  IV as needed ordered   Hx of duodenal adenoma s/p ampullectomy 2021 -Outpatient follow-up with GI    Dementia -On home Aricept   Morbid obesity -BMI=43, calorie control  recommended.  PT/OT   DVT prophylaxis: Eliquis Code Status: Full code Family Communication: Daughter at bedside  Status is: Inpatient.  Still weak on 3 L nasal cannula.  Gets short of breath with minimal ambulation   Subjective: Overall patient feels well better than yesterday but still getting exertional dyspnea and hypoxia requiring 2 L nasal cannula.  Desaturates down to 82% with minimal ambulation and movement.   Examination: Constitutional: Not in acute distress, 3 L nasal cannula Respiratory: Bibasilar rhonchi Cardiovascular: Normal sinus rhythm, no rubs Abdomen: Nontender nondistended good bowel sounds Musculoskeletal: No edema noted Skin: No rashes seen Neurologic: CN 2-12 grossly intact.  And nonfocal Psychiatric: Normal judgment and insight. Alert and oriented x 3. Normal mood.  Objective: Vitals:   10/31/22 0914 10/31/22 1515 10/31/22 2024 10/31/22 2253  BP:  (!) 110/52  (!) 140/69  Pulse:  (!) 52  68  Resp:    20  Temp:  98.4 F (36.9 C)  97.7 F (36.5 C)  TempSrc:  Oral  Oral  SpO2: 94% 96% 96% 93%  Weight:      Height:       No intake or output data in the 24 hours ending 11/01/22 0723  Filed Weights   10/30/22 1004  Weight: (!) 142 kg     Data Reviewed:   CBC: Recent Labs  Lab 10/30/22 1032 10/30/22 1459 10/30/22 2106 10/31/22 0325 11/01/22 0438  WBC 10.3  --   --  8.6 9.3  HGB 13.5 12.6* 13.1 12.9* 12.9*  HCT 40.6 37.0* 39.4 38.5* 40.1  MCV 93.8  --   --  93.9 94.6  PLT 429*  --   --  376 412*  Basic Metabolic Panel: Recent Labs  Lab 10/30/22 1032 10/30/22 1459 10/31/22 0325 11/01/22 0438  NA 139 140 137 136  K 3.2* 3.3* 3.3* 3.6  CL 102  --  99 103  CO2 24  --  27 25  GLUCOSE 112*  --  100* 109*  BUN 17  --  17 15  CREATININE 1.35*  --  1.22 1.22  CALCIUM 8.5*  --  8.6* 8.7*  MG  --   --   --  2.1   GFR: Estimated Creatinine Clearance: 72 mL/min (by C-G formula based on SCr of 1.22 mg/dL). Liver Function Tests: No  results for input(s): "AST", "ALT", "ALKPHOS", "BILITOT", "PROT", "ALBUMIN" in the last 168 hours. No results for input(s): "LIPASE", "AMYLASE" in the last 168 hours. No results for input(s): "AMMONIA" in the last 168 hours. Coagulation Profile: No results for input(s): "INR", "PROTIME" in the last 168 hours. Cardiac Enzymes: No results for input(s): "CKTOTAL", "CKMB", "CKMBINDEX", "TROPONINI" in the last 168 hours. BNP (last 3 results) No results for input(s): "PROBNP" in the last 8760 hours. HbA1C: No results for input(s): "HGBA1C" in the last 72 hours. CBG: No results for input(s): "GLUCAP" in the last 168 hours. Lipid Profile: No results for input(s): "CHOL", "HDL", "LDLCALC", "TRIG", "CHOLHDL", "LDLDIRECT" in the last 72 hours. Thyroid Function Tests: No results for input(s): "TSH", "T4TOTAL", "FREET4", "T3FREE", "THYROIDAB" in the last 72 hours. Anemia Panel: No results for input(s): "VITAMINB12", "FOLATE", "FERRITIN", "TIBC", "IRON", "RETICCTPCT" in the last 72 hours. Sepsis Labs: Recent Labs  Lab 10/30/22 1207 10/30/22 2106 10/31/22 0325  PROCALCITON  --  <0.10 <0.10  LATICACIDVEN 1.8 2.0*  --     Recent Results (from the past 240 hour(s))  Blood culture (routine x 2)     Status: None (Preliminary result)   Collection Time: 10/30/22 12:07 PM   Specimen: BLOOD  Result Value Ref Range Status   Specimen Description BLOOD LEFT ANTECUBITAL  Final   Special Requests   Final    BOTTLES DRAWN AEROBIC AND ANAEROBIC Blood Culture adequate volume   Culture   Final    NO GROWTH < 24 HOURS Performed at [facility], [address] [place], [place] [address]    Report Status PENDING  Incomplete  Expectorated Sputum Assessment w Gram Stain, Rflx to Resp Cult     Status: None   Collection Time: 10/30/22  1:56 PM   Specimen: Expectorated Sputum  Result Value Ref Range Status   Specimen Description EXPECTORATED SPUTUM  Final   Special Requests NONE  Final   Sputum  evaluation   Final    Sputum specimen not acceptable for testing.  Please recollect.   RN Marylou Flesher ON 10/30/22 @ 1720 BY DRT Performed at [facility], [address], [place], [place] [address]    Report Status 10/30/2022 FINAL  Final  Blood culture (routine x 2)     Status: None (Preliminary result)   Collection Time: 10/30/22  9:06 PM   Specimen: BLOOD RIGHT HAND  Result Value Ref Range Status   Specimen Description BLOOD RIGHT HAND  Final   Special Requests   Final    BOTTLES DRAWN AEROBIC ONLY Blood Culture results may not be optimal due to an inadequate volume of blood received in culture bottles   Culture   Final    NO GROWTH < 12 HOURS Performed at [facility], [address] [place], [place] [address]    Report Status  PENDING  Incomplete  Respiratory (~20 pathogens) panel by PCR     Status: None   Collection Time: 10/31/22  8:34 AM   Specimen: Nasopharyngeal Swab; Respiratory  Result Value Ref Range Status   Adenovirus NOT DETECTED NOT DETECTED Final   Coronavirus 229E NOT DETECTED NOT DETECTED Final    Comment: (NOTE) The Coronavirus on the Respiratory Panel, DOES NOT test for the novel  Coronavirus (2019 nCoV)    Coronavirus HKU1 NOT DETECTED NOT DETECTED Final   Coronavirus NL63 NOT DETECTED NOT DETECTED Final   Coronavirus OC43 NOT DETECTED NOT DETECTED Final   Metapneumovirus NOT DETECTED NOT DETECTED Final   Rhinovirus / Enterovirus NOT DETECTED NOT DETECTED Final   Influenza A NOT DETECTED NOT DETECTED Final   Influenza B NOT DETECTED NOT DETECTED Final   Parainfluenza Virus 1 NOT DETECTED NOT DETECTED Final   Parainfluenza Virus 2 NOT DETECTED NOT DETECTED Final   Parainfluenza Virus 3 NOT DETECTED NOT DETECTED Final   Parainfluenza Virus 4 NOT DETECTED NOT DETECTED Final   Respiratory Syncytial Virus NOT DETECTED NOT DETECTED Final   Bordetella pertussis NOT DETECTED NOT DETECTED Final   Bordetella Parapertussis NOT DETECTED NOT DETECTED  Final   Chlamydophila pneumoniae NOT DETECTED NOT DETECTED Final   Mycoplasma pneumoniae NOT DETECTED NOT DETECTED Final    Comment: Performed at [facility], [address] [place], [place] [address]         Radiology Studies: CT CHEST WO CONTRAST  Result Date: 10/30/2022 CLINICAL DATA:  Pneumonia EXAM: CT CHEST WITHOUT CONTRAST TECHNIQUE: Multidetector CT imaging of the chest was performed following the standard protocol without IV contrast. RADIATION DOSE REDUCTION: This exam was performed according to the departmental dose-optimization program which includes automated exposure control, adjustment of the mA and/or kV according to patient size and/or use of iterative reconstruction technique. COMPARISON:  None Available. FINDINGS: Cardiovascular: Coronary artery calcification and aortic atherosclerotic calcification. Mediastinum/Nodes: No axillary or supraclavicular adenopathy. No mediastinal or hilar adenopathy. No pericardial fluid. Esophagus normal. Lungs/Pleura: Multiple foci of dense airspace disease in the lower lobes and upper lobes. Somewhat peripheral distribution. No pleural fluid. No empyema. No abscess. Upper Abdomen: Limited view of the liver, kidneys, pancreas are unremarkable. Normal adrenal glands. Musculoskeletal: No aggressive osseous lesion. IMPRESSION: Multifocal dense airspace disease without effusion or adenopathy. Favor COVID pneumonia. Electronically Signed   By: Genevive Bi M.D.   On: 10/30/2022 14:47   DG Chest 2 View  Result Date: 10/30/2022 CLINICAL DATA:  Provided history: Shortness of breath. EXAM: CHEST - 2 VIEW COMPARISON:  Chest radiographs 12/01/2021 and earlier. FINDINGS: Mild cardiomegaly, unchanged. Patchy airspace opacities bilaterally (right greater than left). No evidence of pleural effusion or pneumothorax. No acute bony abnormality identified. Multilevel ventrolateral osteophytes within the thoracic spine. IMPRESSION: Patchy airspace  opacities bilaterally (right greater than left), most suggestive multifocal pneumonia. Recommend radiographic follow-up to complete resolution to exclude any underlying malignancy. Mild cardiomegaly. Electronically Signed   By: Jackey Loge D.O.   On: 10/30/2022 11:40        Scheduled Meds:  amLODipine  10 mg Oral Daily   apixaban  5 mg Oral BID   atorvastatin  80 mg Oral Daily   azithromycin  500 mg Oral Daily   budesonide (PULMICORT) nebulizer solution  0.25 mg Nebulization BID   dextromethorphan-guaiFENesin  1 tablet Oral BID   donepezil  23 mg Oral q AM   losartan  50 mg Oral Daily   And  hydrochlorothiazide  12.5 mg Oral Daily   metoprolol tartrate  50 mg Oral BID   psyllium  1 packet Oral BID   tamsulosin  0.4 mg Oral q AM   Continuous Infusions:     LOS: 2 days   Time spent= 35 mins    Luisantonio Adinolfi Joline Maxcy, MD [facility]  If 7PM-7AM, please contact night-coverage  11/01/2022, 7:23 AM

## 2022-11-01 NOTE — Care Management Important Message (Signed)
Important Message  Patient Details  Name: Justin Davidson MRN: [id number] Date of Birth: Apr 10, 1946   Medicare Important Message Given:  Other (see comment)     Justin Davidson 11/01/2022, 11:36 AM

## 2022-11-01 NOTE — Progress Notes (Signed)
  Transition of Care [facility]) Screening Note   Patient Details  Name: Ranen Doolin Date of Birth: 05/07/1946   Transition of Care [facility]) CM/SW Contact:    Joanne Chars, LCSW Phone Number: 11/01/2022, 10:46 AM    Transition of Care Department [facility]) has reviewed patient and no TOC needs have been identified at this time. We will continue to monitor patient advancement through interdisciplinary progression rounds. If new patient transition needs arise, please place a TOC consult.

## 2022-11-01 NOTE — Evaluation (Addendum)
Occupational Therapy Evaluation/Discharge Patient Details Name: Justin Davidson MRN: [id number] DOB: [birth year] Today's Date: 11/01/2022   History of Present Illness 76 yo male [facility] with ShOB, cough due to PNA. Pt diagnosed with covid x3 weeks ago. PMH includes dementia, HTN, HLD, OSA not tolerating CPAP, morbid obesity, duodenal adenoma status post transduodenal ampullectomy 2021, PAF on Eliquis, CAD.   Clinical Impression   PTA, pt lives with spouse, typically Independent in daily tasks without need for AD. Pt presents now with deficits in cardiopulmonary tolerance, requiring supplemental O2 to maintain O2 sats. Provided initial min guard for safety during mobility without AD due to pt's quick movements though no LOB noted. Pt able to manage mobility in room and ADLs in bathroom without assistance. Pt reports he has been going to bathroom unassisted during this admission w/o issue. Educated pt and daughter present on energy conservation strategies, continued OOB mobility and spending time up in chair for optimal pulmonary recovery. Pt would benefit from mobility services while admitted though no further skilled OT services needed at this time.  SpO2 93% on 2 L O2 at rest, desats to 89% on 2 L O2 with activity. Denies SOB.      Recommendations for follow up therapy are one component of a multi-disciplinary discharge planning process, led by the attending physician.  Recommendations may be updated based on patient status, additional functional criteria and insurance authorization.   Follow Up Recommendations  No OT follow up     Assistance Recommended at Discharge PRN  Patient can return home with the following      Functional Status Assessment  Patient has had a recent decline in their functional status and demonstrates the ability to make significant improvements in function in a reasonable and predictable amount of time.  Equipment Recommendations  None recommended by  OT    Recommendations for Other Services       Precautions / Restrictions Precautions Precautions: Fall;Other (comment) Precaution Comments: monitor O2 Restrictions Weight Bearing Restrictions: No      Mobility Bed Mobility Overal bed mobility: Modified Independent                  Transfers Overall transfer level: Modified independent Equipment used: None                      Balance Overall balance assessment: No apparent balance deficits (not formally assessed)                                         ADL either performed or assessed with clinical judgement   ADL Overall ADL's : At baseline                                       General ADL Comments: Provided min guard for initial safety in standing/mobility due to impulsivity though no overt LOB noted. Pt able to mobilize without AD in room, toilet self and perform grooming tasks standing at sink. Educated on energy conservation strategies to implement at home (rest breaks, if O2 needed, use of shower chair, etc)     Vision Ability to See in Adequate Light: 0 Adequate Patient Visual Report: No change from baseline Vision Assessment?: No apparent visual deficits     Perception  Praxis      Pertinent Vitals/Pain Pain Assessment Pain Assessment: Faces Faces Pain Scale: Hurts a little bit Pain Location: low back Pain Descriptors / Indicators: Sore Pain Intervention(s): Other (comment) (encouraged repositioning higher in bed)     Hand Dominance Right   Extremity/Trunk Assessment Upper Extremity Assessment Upper Extremity Assessment: Overall WFL for tasks assessed   Lower Extremity Assessment Lower Extremity Assessment: Defer to PT evaluation   Cervical / Trunk Assessment Cervical / Trunk Assessment: Normal   Communication Communication Communication: Other (comment) (minor garbled speech at times)   Cognition Arousal/Alertness: Awake/alert Behavior  During Therapy: WFL for tasks assessed/performed, Impulsive Overall Cognitive Status: History of cognitive impairments - at baseline                                 General Comments: hx of dementia, follows directions though quick and impulsive with movements at times. Daughter reports this is baseline     General Comments  Daughter at bedside    Exercises     Shoulder [place] expects to be discharged to:: Private residence Living Arrangements: Spouse/significant other Available Help at Discharge: Family;Available 24 hours/day Type of Home: House Home Access: Stairs to enter [employer] of Steps: 2-3   Home Layout: Two level;Bed/bath upstairs;Able to live on main level with bedroom/bathroom     Bathroom Shower/Tub: Occupational psychologist: Handicapped height Bathroom Accessibility: Yes   Home Equipment: Conservation officer, nature (2 wheels) (RW is wife's)   Additional Comments: pt reports he typically uses the bedroom upstairs but can stay downstairs if needed. reports handicapped accessible bathroom      Prior Functioning/Environment Prior Level of Function : Independent/Modified Independent             Mobility Comments: no use of AD for mobility ADLs Comments: Able to manage ADLs, enjoys classic cars        OT Problem List: Cardiopulmonary status limiting activity      OT Treatment/Interventions:      OT Goals(Current goals can be found in the care plan section) Acute Rehab OT Goals Patient Stated Goal: have some breakfast OT Goal Formulation: All assessment and education complete, DC therapy  OT Frequency:      Co-evaluation              AM-PAC OT "6 Clicks" Daily Activity     Outcome Measure Help from another person eating meals?: None Help from another person taking care of personal grooming?: None Help from another person toileting, which includes using toliet, bedpan, or urinal?:  None Help from another person bathing (including washing, rinsing, drying)?: A Little Help from another person to put on and taking off regular upper body clothing?: None Help from another person to put on and taking off regular lower body clothing?: None 6 Click Score: 23   End of Session Equipment Utilized During Treatment: Gait belt  Activity Tolerance: Patient tolerated treatment well Patient left: in bed;with call bell/phone within reach;with bed alarm set;with family/visitor present  OT Visit Diagnosis: Other (comment) (decreased cardiopulmonary tolerance)                Time: 6720-9470 OT Time Calculation (min): 28 min Charges:  OT General Charges $OT Visit: 1 Visit OT Evaluation $OT Eval Low Complexity: 1 Low OT Treatments $Self Care/Home Management : 8-22 mins  Malachy Chamber, OTR/L Acute Rehab Services  Office: [facility] 11/01/2022, 8:19 AM

## 2022-11-01 NOTE — Progress Notes (Signed)
Mobility Specialist Progress Note   11/01/22 1633  Mobility  Activity Ambulated with assistance in hallway  Level of Assistance Minimal assist, patient does 75% or more  Assistive Device None  Distance Ambulated (ft) 210 ft  Activity Response Tolerated well  $Mobility charge 1 Mobility   Pre Mobility: 98% SpO2 on 3.5LO2 During Mobility: 87%SpO2 on 3LO2 Post Mobility: 92% SpO2 on 3.5LO2  Received pt in bed on 3.5LO2 having no complaints and agreeable. MinA to get to EOB d/t limited core strength. Once standing, pt deferring AD for ambulation but requiring moderate directional cues and constant cues on gait speed, CGA - MinA. Returned back to the bed w/o fault, call bell in reach and bed alarm on.  Holland Falling Mobility Specialist Acute Rehab Office:  [phone]

## 2022-11-01 NOTE — Plan of Care (Signed)
  Problem: Activity: Goal: Ability to tolerate increased activity will improve Outcome: Progressing   Problem: Nutrition: Goal: Adequate nutrition will be maintained Outcome: Progressing   Problem: Coping: Goal: Level of anxiety will decrease Outcome: Progressing

## 2022-11-01 NOTE — Progress Notes (Signed)
Mobility Specialist Progress Note   11/01/22 1536  Mobility  Activity Ambulated with assistance to bathroom  Level of Assistance Minimal assist, patient does 75% or more  Assistive Device None  Distance Ambulated (ft) 12 ft  Activity Response Tolerated well  $Mobility charge 1 Mobility   Pt requesting assistance to get from bed to BR d/t BM urgency. Required minG throughout d/t slight unsteadiness but no faults or incidents. Pt returned back to bed after a successful BM. Call bell placed in reach and bed alarm on.   Holland Falling Mobility Specialist Acute Rehab Office:  [phone]

## 2022-11-02 DIAGNOSIS — J9621 Acute and chronic respiratory failure with hypoxia: Secondary | ICD-10-CM

## 2022-11-02 LAB — BASIC METABOLIC PANEL
Anion gap: 13 (ref 5–15)
BUN: 18 mg/dL (ref 8–23)
CO2: 27 mmol/L (ref 22–32)
Calcium: 9.1 mg/dL (ref 8.9–10.3)
Chloride: 96 mmol/L — ABNORMAL LOW (ref 98–111)
Creatinine, Ser: 1.43 mg/dL — ABNORMAL HIGH (ref 0.61–1.24)
GFR, Estimated: 50 mL/min — ABNORMAL LOW (ref >60)
Glucose, Bld: 101 mg/dL — ABNORMAL HIGH (ref 70–99)
Potassium: 4.2 mmol/L (ref 3.5–5.1)
Sodium: 136 mmol/L (ref 135–145)

## 2022-11-02 LAB — CBC
HCT: 40.2 % (ref 39.0–52.0)
Hemoglobin: 13.3 g/dL (ref 13.0–17.0)
MCH: 31.4 pg (ref 26.0–34.0)
MCHC: 33.1 g/dL (ref 30.0–36.0)
MCV: 95 fL (ref 80.0–100.0)
Platelets: 401 10*3/uL — ABNORMAL HIGH (ref 150–400)
RBC: 4.23 MIL/uL (ref 4.22–5.81)
RDW: 14.1 % (ref 11.5–15.5)
WBC: 9 10*3/uL (ref 4.0–10.5)
nRBC: 0 % (ref 0.0–0.2)

## 2022-11-02 LAB — LEGIONELLA PNEUMOPHILA SEROGP 1 UR AG: L. pneumophila Serogp 1 Ur Ag: NEGATIVE

## 2022-11-02 LAB — MAGNESIUM: Magnesium: 2.1 mg/dL (ref 1.7–2.4)

## 2022-11-02 MED ORDER — AMOXICILLIN-POT CLAVULANATE 875-125 MG PO TABS: 1.0000 | ORAL_TABLET | Freq: Two times a day (BID) | ORAL | 0 refills | Status: AC

## 2022-11-02 MED ORDER — IPRATROPIUM-ALBUTEROL 0.5-2.5 (3) MG/3ML IN SOLN: 3.0000 mL | Freq: Four times a day (QID) | RESPIRATORY_TRACT | 1 refills | Status: DC | PRN

## 2022-11-02 MED ORDER — GUAIFENESIN 100 MG/5ML PO LIQD: 5.0000 mL | ORAL | 0 refills | Status: DC | PRN

## 2022-11-02 MED ORDER — AZITHROMYCIN 500 MG PO TABS: 500.0000 mg | ORAL_TABLET | Freq: Every day | ORAL | 0 refills | Status: AC

## 2022-11-02 MED ORDER — ALBUTEROL SULFATE HFA 108 (90 BASE) MCG/ACT IN AERS: 2.0000 | INHALATION_SPRAY | Freq: Four times a day (QID) | RESPIRATORY_TRACT | 1 refills | Status: DC | PRN

## 2022-11-02 NOTE — Discharge Instructions (Signed)
Follow with Justin Seashore, MD in 5-7 days  Please get a complete blood count and chemistry panel checked by your Primary MD at your next visit, and again as instructed by your Primary MD. Please get your medications reviewed and adjusted by your Primary MD.  Please request your Primary MD to go over all Hospital Tests and Procedure/Radiological results at the follow up, please get all Hospital records sent to your Prim MD by signing hospital release before you go home.  In some cases, there will be blood work, cultures and biopsy results pending at the time of your discharge. Please request that your primary care M.D. goes through all the records of your hospital data and follows up on these results.  If you had Pneumonia of Lung problems at the Hospital: Please get a 2 view Chest X ray done in 2-4 weeks after hospital discharge or sooner if instructed by your Primary MD.  If you have Congestive Heart Failure: Please call your Cardiologist or Primary MD anytime you have any of the following symptoms:  1) 3 pound weight gain in 24 hours or 5 pounds in 1 week  2) shortness of breath, with or without a dry hacking cough  3) swelling in the hands, feet or stomach  4) if you have to sleep on extra pillows at night in order to breathe  Follow cardiac low salt diet and 1.5 lit/day fluid restriction.  If you have diabetes Accuchecks 4 times/day, Once in AM empty stomach and then before each meal. Log in all results and show them to your primary doctor at your next visit. If any glucose reading is under 80 or above 300 call your primary MD immediately.  If you have Seizure/Convulsions/Epilepsy: Please do not drive, operate heavy machinery, participate in activities at heights or participate in high speed sports until you have seen by Primary MD or a Neurologist and advised to do so again. Per [facility] statutes, patients with seizures are not allowed to drive until they have been  seizure-free for six months.  Use caution when using heavy equipment or power tools. Avoid working on ladders or at heights. Take showers instead of baths. Ensure the water temperature is not too high on the home water heater. Do not go swimming alone. Do not lock yourself in a room alone (i.e. bathroom). When caring for infants or small children, sit down when holding, feeding, or changing them to minimize risk of injury to the child in the event you have a seizure. Maintain good sleep hygiene. Avoid alcohol.   If you had Gastrointestinal Bleeding: Please ask your Primary MD to check a complete blood count within one week of discharge or at your next visit. Your endoscopic/colonoscopic biopsies that are pending at the time of discharge, will also need to followed by your Primary MD.  Get Medicines reviewed and adjusted. Please take all your medications with you for your next visit with your Primary MD  Please request your Primary MD to go over all hospital tests and procedure/radiological results at the follow up, please ask your Primary MD to get all Hospital records sent to his/her office.  If you experience worsening of your admission symptoms, develop shortness of breath, life threatening emergency, suicidal or homicidal thoughts you must seek medical attention immediately by calling 911 or calling your MD immediately  if symptoms less severe.  You must read complete instructions/literature along with all the possible adverse reactions/side effects for all the Medicines you take  and that have been prescribed to you. Take any new Medicines after you have completely understood and accpet all the possible adverse reactions/side effects.   Do not drive or operate heavy machinery when taking Pain medications.   Do not take more than prescribed Pain, Sleep and Anxiety Medications  Special Instructions: If you have smoked or chewed Tobacco  in the last 2 yrs please stop smoking, stop any regular  Alcohol  and or any Recreational drug use.  Wear Seat belts while driving.  Please note You were cared for by a hospitalist during your hospital stay. If you have any questions about your discharge medications or the care you received while you were in the hospital after you are discharged, you can call the unit and asked to speak with the hospitalist on call if the hospitalist that took care of you is not available. Once you are discharged, your primary care physician will handle any further medical issues. Please note that NO REFILLS for any discharge medications will be authorized once you are discharged, as it is imperative that you return to your primary care physician (or establish a relationship with a primary care physician if you do not have one) for your aftercare needs so that they can reassess your need for medications and monitor your lab values.  You can reach the hospitalist office at phone [phone] or fax [phone]   If you do not have a primary care physician, you can call [phone] for a physician referral.  Activity: As tolerated with Full fall precautions use walker/cane & assistance as needed    Diet: low sodium  Disposition Home \

## 2022-11-02 NOTE — TOC Transition Note (Addendum)
Transition of Care [facility]) - CM/SW Discharge Note   Patient Details  Name: Rayn Enderson MRN: [id number] Date of Birth: 12/10/1946  [facility]) CM/SW Contact:  Tom-Johnson, Renea Ee, RN Phone Number: 11/02/2022, 9:46 AM   Clinical Narrative:     Patient is scheduled for discharge today. Nebulizer machine and Home Oxygen order called in to [employer] and Erasmo Downer to deliver bedside. No PT/OT f/u noted. Family to transport at discharge. No further TOC needs noted.    Final next level of care: Home/Self Care Barriers to Discharge: Barriers Resolved   Patient Goals and CMS Choice Patient states their goals for this hospitalization and ongoing recovery are:: To return home CMS Medicare.gov Compare Post Acute Care list provided to:: Patient Choice offered to / list presented to : Patient  Discharge Placement                Patient to be transferred to facility by: Family      Discharge Plan and Services                DME Arranged: Nebulizer machine, Oxygen DME Agency: [employer] Date DME Agency Contacted: 11/02/22 Time DME Agency Contacted: [phone] Representative spoke with at DME Agency: Erasm[facility] Downer HH Arranged: NA [place] Agency: NA        Social Determinants of Health ([place]) Interventions     Readmission Risk Interventions     No data to display

## 2022-11-02 NOTE — Discharge Summary (Signed)
Physician Discharge Summary  Justin Davidson PZW:[id number] DOB: 17-Mar-1946 DOA: 10/30/2022  PCP: Merrilee Seashore, MD  Admit date: 10/30/2022 Discharge date: 11/02/2022  Admitted From: home Disposition:  home  Recommendations for Outpatient Follow-up:  Follow up with PCP in 1-2 weeks  Home Health: none Equipment/Devices: home O2  Discharge Condition: stable CODE STATUS: Full code Diet Orders (From admission, onward)     Start     Ordered   10/30/22 1346  Diet Heart Room service appropriate? Yes; Fluid consistency: Thin  Diet effective now       Question Answer Comment  Room service appropriate? Yes   Fluid consistency: Thin      10/30/22 1346            HPI: Per admitting MD, Justin Davidson is a 76 y.o. male with medical history significant of dementia, HTN, HLD, OSA not tolerating CPAP, morbid obesity, duodenal adenoma status post transduodenal ampullectomy 2021, PAF on Eliquis, CAD, came with worsening of cough and shortness of breath. Patient is demented unable to provide much history, history provided by wife and daughter at bedside.  Family reported that patient has had a chronic cough "long term upper respiration symptoms" including alternative dry and productive cough with whitish phlegm, but no weight loss for night sweats.  3 weeks ago patient started to have worsening of his cough and started to feel shortness of breath with minimal activity, home COVID kit test turned positive, but family decided not to give the patient antibiotic treatment that time.  But gradually, family noticed patient breathing symptoms continue to deteriorated, with more productive cough with whitish phlegm, and started to have night sweats, and cough more happens at night but no wheezing.  And patient unable to sleep last night because of the cough and shortness of breath.  Family also reported patient has no history of aspiration but does have occasional choking/cough after eating certain  foods.  She is on Eliquis and last few days, patient also found bright red blood when wiping, no ABD pain. His GERD has been fairly controlled no heart burns.  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  BNP, procalcitonin negative.  Strep pneumonia urine antigen is also negative.  CT chest without contrast showing multifocal dense opacity and airspace.  Suspect post-COVID lung changes but cannot exclude superimposed bacterial infection.  He was placed on antibiotics, improved significantly, now able to ambulate in the hallway without much difficulties.  He remains hypoxic and needed supplemental oxygen.  He will be transition to oral antibiotics and will be discharged home in stable condition.  Active problems  Rectal bleed - resolved. Possible hemorrhoid bleed.  Hypokalemia -Repletion HTN -Continue home medications on discharge  OSA-Noncompliant with outpatient CPAP Paroxysmal atrial fibrillation-Continue home regimen  Hx of duodenal adenoma s/p ampullectomy 2021-Outpatient follow-up with GI Dementia-On home Aricept Morbid obesity -BMI=43, calorie control recommended.  Sepsis ruled out   Discharge Instructions   Allergies as of 11/02/2022       Reactions   Lisinopril Cough        Medication List     TAKE these medications    acetaminophen 500 MG tablet Commonly known as: TYLENOL Take 500 mg by mouth every 6 (six) hours as needed for mild pain, headache or fever.   albuterol 108 (90 Base) MCG/ACT inhaler Commonly known as:  VENTOLIN HFA Inhale 2 puffs into the lungs every 6 (six) hours as needed for wheezing or shortness of breath.   amLODipine 10 MG tablet Commonly known as: NORVASC Take 1 tablet (10 mg total) by mouth daily.   amoxicillin-clavulanate 875-125 MG tablet Commonly known  as: AUGMENTIN Take 1 tablet by mouth 2 (two) times daily for 4 days.   apixaban 5 MG Tabs tablet Commonly known as: ELIQUIS Take 1 tablet (5 mg total) by mouth 2 (two) times daily.   atorvastatin 80 MG tablet Commonly known as: LIPITOR TAKE 1 TABLET BY MOUTH DAILY   azithromycin 500 MG tablet Commonly known as: Zithromax Take 1 tablet (500 mg total) by mouth daily for 4 days. Take 1 tablet daily for 3 days.   docusate sodium 100 MG capsule Commonly known as: COLACE Take 100 mg by mouth 2 (two) times daily as needed (constipation).   donepezil 23 MG Tabs tablet Commonly known as: ARICEPT Take 23 mg by mouth in the morning.   guaiFENesin 100 MG/5ML liquid Commonly known as: ROBITUSSIN Take 5 mLs by mouth every 4 (four) hours as needed for cough or to loosen phlegm.   ipratropium-albuterol 0.5-2.5 (3) MG/3ML Soln Commonly known as: DUONEB Take 3 mLs by nebulization every 6 (six) hours as needed.   losartan-hydrochlorothiazide 50-12.5 MG tablet Commonly known as: HYZAAR Take 1 tablet by mouth daily.   METAMUCIL FIBER PO Take 1 Scoop by mouth 2 (two) times daily.   metoprolol tartrate 50 MG tablet Commonly known as: LOPRESSOR TAKE 1 TABLET BY MOUTH TWICE DAILY   potassium chloride 10 MEQ CR capsule Commonly known as: MICRO-K Take 10 mEq by mouth in the morning.   tamsulosin 0.4 MG Caps capsule Commonly known as: FLOMAX Take 0.4 mg by mouth in the morning.               Durable Medical Equipment  (From admission, onward)           Start     Ordered   11/02/22 0904  For home use only DME oxygen  Once       Question Answer Comment  Length of Need 6 Months   Mode or (Route) Nasal cannula   Liters per Minute 2   Frequency Continuous (stationary and portable oxygen unit needed)   Oxygen delivery system Gas      11/02/22 0903   11/02/22 0903  For home use only DME Nebulizer machine  Once       Question Answer Comment  Patient needs a nebulizer to treat  with the following condition COVID-19   Length of Need 6 Months      11/02/22 0903             Consultations: none  Procedures/Studies:  CT CHEST WO CONTRAST  Result Date: 10/30/2022 CLINICAL DATA:  Pneumonia EXAM: CT CHEST WITHOUT CONTRAST TECHNIQUE: Multidetector CT imaging of the chest was performed following the standard protocol without IV contrast. RADIATION DOSE REDUCTION: This exam was performed according to the departmental dose-optimization program which includes automated exposure control, adjustment of the mA and/or kV according to patient size and/or use of iterative reconstruction technique. COMPARISON:  None Available. FINDINGS: Cardiovascular: Coronary artery calcification and aortic atherosclerotic calcification. Mediastinum/Nodes: No axillary or supraclavicular adenopathy. No mediastinal or hilar adenopathy. No pericardial fluid. Esophagus normal. Lungs/Pleura: Multiple foci of dense airspace disease in the lower lobes and upper lobes. Somewhat peripheral distribution. No pleural fluid. No empyema. No abscess. Upper Abdomen: Limited view  of the liver, kidneys, pancreas are unremarkable. Normal adrenal glands. Musculoskeletal: No aggressive osseous lesion. IMPRESSION: Multifocal dense airspace disease without effusion or adenopathy. Favor COVID pneumonia. Electronically Signed   By: Suzy Bouchard M.D.   On: 10/30/2022 14:47   DG Chest 2 View  Result Date: 10/30/2022 CLINICAL DATA:  Provided history: Shortness of breath. EXAM: CHEST - 2 VIEW COMPARISON:  Chest radiographs 12/01/2021 and earlier. FINDINGS: Mild cardiomegaly, unchanged. Patchy airspace opacities bilaterally (right greater than left). No evidence of pleural effusion or pneumothorax. No acute bony abnormality identified. Multilevel ventrolateral osteophytes within the thoracic spine. IMPRESSION: Patchy airspace opacities bilaterally (right greater than left), most suggestive multifocal pneumonia. Recommend  radiographic follow-up to complete resolution to exclude any underlying malignancy. Mild cardiomegaly. Electronically Signed   By: Kellie Simmering D.O.   On: 10/30/2022 11:40     Subjective: - no chest pain, shortness of breath, no abdominal pain, nausea or vomiting.   Discharge Exam: BP 139/60 (BP Location: Left Arm)   Pulse 61   Temp 98 F (36.7 C) (Oral)   Resp 18   Ht 5' 11" (1.803 m)   Wt (!) 142 kg   SpO2 96%   BMI 43.65 kg/m   General: Pt is alert, awake, not in acute distress Cardiovascular: RRR, S1/S2 +, no rubs, no gallops Respiratory: CTA bilaterally, no wheezing, no rhonchi Abdominal: Soft, NT, ND, bowel sounds + Extremities: no edema, no cyanosis   The results of significant diagnostics from this hospitalization (including imaging, microbiology, ancillary and laboratory) are listed below for reference.     Microbiology: Recent Results (from the past 240 hour(s))  Blood culture (routine x 2)     Status: None (Preliminary result)   Collection Time: 10/30/22 12:07 PM   Specimen: BLOOD  Result Value Ref Range Status   Specimen Description BLOOD LEFT ANTECUBITAL  Final   Special Requests   Final    BOTTLES DRAWN AEROBIC AND ANAEROBIC Blood Culture adequate volume   Culture   Final    NO GROWTH 2 DAYS Performed at [facility], [address], [place], [place] [address]    Report Status PENDING  Incomplete  Expectorated Sputum Assessment w Gram Stain, Rflx to Resp Cult     Status: None   Collection Time: 10/30/22  1:56 PM   Specimen: Expectorated Sputum  Result Value Ref Range Status   Specimen Description EXPECTORATED SPUTUM  Final   Special Requests NONE  Final   Sputum evaluation   Final    Sputum specimen not acceptable for testing.  Please recollect.   RN Genia Hotter ON 10/30/22 @ 1720 BY DRT Performed at [facility], [place] [address] [place], [place] [address]    Report Status 10/30/2022 FINAL  Final  Blood culture (routine x 2)     Status:  None (Preliminary result)   Collection Time: 10/30/22  9:06 PM   Specimen: BLOOD RIGHT HAND  Result Value Ref Range Status   Specimen Description BLOOD RIGHT HAND  Final   Special Requests   Final    BOTTLES DRAWN AEROBIC ONLY Blood Culture results may not be optimal due to an inadequate volume of blood received in culture bottles   Culture   Final    NO GROWTH 2 DAYS Performed at [facility], [place] [address], [place], [place] [address]    Report Status PENDING  Incomplete  Respiratory (~20 pathogens) panel by PCR     Status: None  Collection Time: 10/31/22  8:34 AM   Specimen: Nasopharyngeal Swab; Respiratory  Result Value Ref Range Status   Adenovirus NOT DETECTED NOT DETECTED Final   Coronavirus 229E NOT DETECTED NOT DETECTED Final    Comment: (NOTE) The Coronavirus on the Respiratory Panel, DOES NOT test for the novel  Coronavirus (2019 nCoV)    Coronavirus HKU1 NOT DETECTED NOT DETECTED Final   Coronavirus NL63 NOT DETECTED NOT DETECTED Final   Coronavirus OC43 NOT DETECTED NOT DETECTED Final   Metapneumovirus NOT DETECTED NOT DETECTED Final   Rhinovirus / Enterovirus NOT DETECTED NOT DETECTED Final   Influenza A NOT DETECTED NOT DETECTED Final   Influenza B NOT DETECTED NOT DETECTED Final   Parainfluenza Virus 1 NOT DETECTED NOT DETECTED Final   Parainfluenza Virus 2 NOT DETECTED NOT DETECTED Final   Parainfluenza Virus 3 NOT DETECTED NOT DETECTED Final   Parainfluenza Virus 4 NOT DETECTED NOT DETECTED Final   Respiratory Syncytial Virus NOT DETECTED NOT DETECTED Final   Bordetella pertussis NOT DETECTED NOT DETECTED Final   Bordetella Parapertussis NOT DETECTED NOT DETECTED Final   Chlamydophila pneumoniae NOT DETECTED NOT DETECTED Final   Mycoplasma pneumoniae NOT DETECTED NOT DETECTED Final    Comment: Performed at [facility], [place]. [address], [place], [place] [address]     Labs: Basic Metabolic Panel: Recent Labs  Lab 10/30/22 1032 10/30/22 1459  10/31/22 0325 11/01/22 0438 11/02/22 0215  NA 139 140 137 136 136  K 3.2* 3.3* 3.3* 3.6 4.2  CL 102  --  99 103 96*  CO2 24  --  _0 GLUCOSE 112*  --  100* 109* 101*  BUN 17  --  _1 CREATININE 1.35*  --  1.22 1.22 1.43*  CALCIUM 8.5*  --  8.6* 8.7* 9.1  MG  --   --   --  2.1 2.1   Liver Function Tests: No results for input(s): "AST", "ALT", "ALKPHOS", "BILITOT", "PROT", "ALBUMIN" in the last 168 hours. CBC: Recent Labs  Lab 10/30/22 1032 10/30/22 1459 10/30/22 2106 10/31/22 0325 11/01/22 0438 11/02/22 0215  WBC 10.3  --   --  8.6 9.3 9.0  HGB 13.5 12.6* 13.1 12.9* 12.9* 13.3  HCT 40.6 37.0* 39.4 38.5* 40.1 40.2  MCV 93.8  --   --  93.9 94.6 95.0  PLT 429*  --   --  376 412* 401*   CBG: No results for input(s): "GLUCAP" in the last 168 hours. Hgb A1c No results for input(s): "HGBA1C" in the last 72 hours. Lipid Profile No results for input(s): "CHOL", "HDL", "LDLCALC", "TRIG", "CHOLHDL", "LDLDIRECT" in the last 72 hours. Thyroid function studies No results for input(s): "TSH", "T4TOTAL", "T3FREE", "THYROIDAB" in the last 72 hours.  Invalid input(s): "FREET3" Urinalysis    Component Value Date/Time   COLORURINE YELLOW 09/06/2017 [place] 09/06/2017 1548   LABSPEC 1.017 09/06/2017 1548   PHURINE 5.0 09/06/2017 1548   GLUCOSEU NEGATIVE 09/06/2017 1548   HGBUR NEGATIVE 09/06/2017 [place] 09/06/2017 1548   KETONESUR NEGATIVE 09/06/2017 1548   PROTEINUR NEGATIVE 09/06/2017 1548   NITRITE NEGATIVE 09/06/2017 1548   LEUKOCYTESUR NEGATIVE 09/06/2017 1548    FURTHER DISCHARGE INSTRUCTIONS:   Get Medicines reviewed and adjusted: Please take all your medications with you for your next visit with your Primary MD   Laboratory/radiological data: Please request your Primary MD to go over all hospital tests and procedure/radiological results at the follow  up, please ask your Primary MD to get all Hospital records sent to  his/her office.   In some cases, they will be blood work, cultures and biopsy results pending at the time of your discharge. Please request that your primary care M.D. goes through all the records of your hospital data and follows up on these results.   Also Note the following: If you experience worsening of your admission symptoms, develop shortness of breath, life threatening emergency, suicidal or homicidal thoughts you must seek medical attention immediately by calling 911 or calling your MD immediately  if symptoms less severe.   You must read complete instructions/literature along with all the possible adverse reactions/side effects for all the Medicines you take and that have been prescribed to you. Take any new Medicines after you have completely understood and accpet all the possible adverse reactions/side effects.    Do not drive when taking Pain medications or sleeping medications (Benzodaizepines)   Do not take more than prescribed Pain, Sleep and Anxiety Medications. It is not advisable to combine anxiety,sleep and pain medications without talking with your primary care practitioner   Special Instructions: If you have smoked or chewed Tobacco  in the last 2 yrs please stop smoking, stop any regular Alcohol  and or any Recreational drug use.   Wear Seat belts while driving.   Please note: You were cared for by a hospitalist during your hospital stay. Once you are discharged, your primary care physician will handle any further medical issues. Please note that NO REFILLS for any discharge medications will be authorized once you are discharged, as it is imperative that you return to your primary care physician (or establish a relationship with a primary care physician if you do not have one) for your post hospital discharge needs so that they can reassess your need for medications and monitor your lab values.  Time coordinating discharge: 40 minutes  SIGNED:  Marzetta Board, MD,  PhD 11/02/2022, 9:42 AM

## 2022-11-02 NOTE — Progress Notes (Signed)
AVS was discharged with the patient and his daughter, Justin Davidson. All questions answered. Printed prescriptions provided. [name] plans to call and schedule a follow up appointment with the PCP when their office opens. DME was delivered to the room. IV removed, belongings returned. Patient was discharged home, his daughter provided transportation.

## 2022-11-04 LAB — CULTURE, BLOOD (ROUTINE X 2)
Culture: NO GROWTH
Culture: NO GROWTH
Special Requests: ADEQUATE

## 2022-11-08 DIAGNOSIS — R0902 Hypoxemia: Secondary | ICD-10-CM | POA: Diagnosis not present

## 2022-11-08 DIAGNOSIS — Z09 Encounter for follow-up examination after completed treatment for conditions other than malignant neoplasm: Secondary | ICD-10-CM | POA: Diagnosis not present

## 2022-11-08 DIAGNOSIS — R197 Diarrhea, unspecified: Secondary | ICD-10-CM | POA: Diagnosis not present

## 2022-11-08 DIAGNOSIS — R059 Cough, unspecified: Secondary | ICD-10-CM | POA: Diagnosis not present

## 2022-11-24 DIAGNOSIS — G4733 Obstructive sleep apnea (adult) (pediatric): Secondary | ICD-10-CM | POA: Diagnosis not present

## 2022-12-02 DIAGNOSIS — G4733 Obstructive sleep apnea (adult) (pediatric): Secondary | ICD-10-CM | POA: Diagnosis not present

## 2022-12-03 DIAGNOSIS — G4733 Obstructive sleep apnea (adult) (pediatric): Secondary | ICD-10-CM | POA: Diagnosis not present

## 2022-12-27 DIAGNOSIS — D49512 Neoplasm of unspecified behavior of left kidney: Secondary | ICD-10-CM | POA: Diagnosis not present

## 2023-01-02 DIAGNOSIS — G4733 Obstructive sleep apnea (adult) (pediatric): Secondary | ICD-10-CM | POA: Diagnosis not present

## 2023-01-03 DIAGNOSIS — G4733 Obstructive sleep apnea (adult) (pediatric): Secondary | ICD-10-CM | POA: Diagnosis not present

## 2023-01-25 DIAGNOSIS — N182 Chronic kidney disease, stage 2 (mild): Secondary | ICD-10-CM | POA: Diagnosis not present

## 2023-01-25 DIAGNOSIS — R7303 Prediabetes: Secondary | ICD-10-CM | POA: Diagnosis not present

## 2023-01-25 DIAGNOSIS — I10 Essential (primary) hypertension: Secondary | ICD-10-CM | POA: Diagnosis not present

## 2023-01-25 DIAGNOSIS — K219 Gastro-esophageal reflux disease without esophagitis: Secondary | ICD-10-CM | POA: Diagnosis not present

## 2023-01-25 DIAGNOSIS — E782 Mixed hyperlipidemia: Secondary | ICD-10-CM | POA: Diagnosis not present

## 2023-02-01 DIAGNOSIS — N182 Chronic kidney disease, stage 2 (mild): Secondary | ICD-10-CM | POA: Diagnosis not present

## 2023-02-01 DIAGNOSIS — K219 Gastro-esophageal reflux disease without esophagitis: Secondary | ICD-10-CM | POA: Diagnosis not present

## 2023-02-01 DIAGNOSIS — R7989 Other specified abnormal findings of blood chemistry: Secondary | ICD-10-CM | POA: Diagnosis not present

## 2023-02-01 DIAGNOSIS — M5430 Sciatica, unspecified side: Secondary | ICD-10-CM | POA: Diagnosis not present

## 2023-02-01 DIAGNOSIS — I10 Essential (primary) hypertension: Secondary | ICD-10-CM | POA: Diagnosis not present

## 2023-02-01 DIAGNOSIS — R7303 Prediabetes: Secondary | ICD-10-CM | POA: Diagnosis not present

## 2023-02-01 DIAGNOSIS — E782 Mixed hyperlipidemia: Secondary | ICD-10-CM | POA: Diagnosis not present

## 2023-02-12 ENCOUNTER — Telehealth: Payer: Self-pay | Admitting: Neurology

## 2023-02-12 NOTE — Telephone Encounter (Signed)
Pt's daughter is asking for a call to discuss a request to change the type CPAP pt has.  Daughter states the cpap with nose plugs is best, the type that has, that goes around his head gives pt night mares and pt is not using his CPAP because of that.  Daughter is asking for a call from RN to discuss this request.

## 2023-02-12 NOTE — Telephone Encounter (Signed)
Called pt daughter and left a message to please call office.

## 2023-02-16 DIAGNOSIS — D49512 Neoplasm of unspecified behavior of left kidney: Secondary | ICD-10-CM | POA: Diagnosis not present

## 2023-02-22 DIAGNOSIS — D49512 Neoplasm of unspecified behavior of left kidney: Secondary | ICD-10-CM | POA: Diagnosis not present

## 2023-02-22 DIAGNOSIS — J479 Bronchiectasis, uncomplicated: Secondary | ICD-10-CM | POA: Diagnosis not present

## 2023-02-22 DIAGNOSIS — K5730 Diverticulosis of large intestine without perforation or abscess without bleeding: Secondary | ICD-10-CM | POA: Diagnosis not present

## 2023-02-22 DIAGNOSIS — I700 Atherosclerosis of aorta: Secondary | ICD-10-CM | POA: Diagnosis not present

## 2023-02-22 DIAGNOSIS — Z85528 Personal history of other malignant neoplasm of kidney: Secondary | ICD-10-CM | POA: Diagnosis not present

## 2023-02-22 DIAGNOSIS — R918 Other nonspecific abnormal finding of lung field: Secondary | ICD-10-CM | POA: Diagnosis not present

## 2023-02-26 ENCOUNTER — Other Ambulatory Visit: Payer: Self-pay | Admitting: Neurology

## 2023-02-26 DIAGNOSIS — G4733 Obstructive sleep apnea (adult) (pediatric): Secondary | ICD-10-CM

## 2023-02-26 DIAGNOSIS — R4189 Other symptoms and signs involving cognitive functions and awareness: Secondary | ICD-10-CM

## 2023-02-26 NOTE — Telephone Encounter (Signed)
Pt daughter called back and stated pt wants to switch CPAP machines. Pt doesn't want the head gear piece. She is requesting a call back to discuss.

## 2023-02-26 NOTE — Telephone Encounter (Signed)
Called the daughter back. She was stating the patient has been having vivid dreams and not tolerating the headgear. He fights with them on wearing it and putting it on. She is asking for the patient to try a different head gear. Advised the patient that she should reach out to the company about getting a apt to complete a mask refit. Advised they shouldn't need an order but I will place one and send it over just in case.

## 2023-02-27 ENCOUNTER — Telehealth: Payer: Self-pay

## 2023-02-27 DIAGNOSIS — G4733 Obstructive sleep apnea (adult) (pediatric): Secondary | ICD-10-CM

## 2023-02-28 ENCOUNTER — Encounter: Payer: Self-pay | Admitting: Podiatry

## 2023-02-28 ENCOUNTER — Ambulatory Visit: Payer: Medicare Other | Admitting: Podiatry

## 2023-02-28 DIAGNOSIS — M79675 Pain in left toe(s): Secondary | ICD-10-CM | POA: Diagnosis not present

## 2023-02-28 DIAGNOSIS — B351 Tinea unguium: Secondary | ICD-10-CM | POA: Diagnosis not present

## 2023-02-28 DIAGNOSIS — M79674 Pain in right toe(s): Secondary | ICD-10-CM | POA: Diagnosis not present

## 2023-03-04 NOTE — Progress Notes (Signed)
  Subjective:  Patient ID: Justin Davidson, male    DOB: 1946-04-19,  MRN: [id number]  Chief Complaint  Patient presents with   Nail Problem    NP- RFC. fungus on toes - patient takes Plavix    77 y.o. male presents with the above complaint. History confirmed with patient. Nails thick elongated causing pain in shoe gear  Objective:  Physical Exam: warm, good capillary refill, no trophic changes or ulcerative lesions, normal DP and PT pulses, and normal sensory exam. Left Foot: dystrophic yellowed discolored nail plates with subungual debris Right Foot: dystrophic yellowed discolored nail plates with subungual debris   Assessment:   1. Pain due to onychomycosis of toenails of both feet      Plan:  Patient was evaluated and treated and all questions answered.  Discussed the etiology and treatment options for the condition in detail with the patient. Educated patient on the topical and oral treatment options for mycotic nails. Recommended debridement of the nails today. Sharp and mechanical debridement performed of all painful and mycotic nails today. Nails debrided in length and thickness using a nail nipper to level of comfort. Discussed treatment options including appropriate shoe gear. Follow up as needed for painful nails.   Return in about 3 months (around 05/31/2023) for painful thick fungal nails.

## 2023-04-11 DIAGNOSIS — K869 Disease of pancreas, unspecified: Secondary | ICD-10-CM | POA: Diagnosis not present

## 2023-04-26 DIAGNOSIS — G4733 Obstructive sleep apnea (adult) (pediatric): Secondary | ICD-10-CM | POA: Diagnosis not present

## 2023-04-26 NOTE — Telephone Encounter (Signed)
Contacted wife back, informed her we pulled report to review. His ramp is low as it can go for . Min pressure 7, max 18. Informed her we will reduce min pressure to 5. Advised to give [place] a call back if that does not help. She verbally understood and was appreciative.

## 2023-04-26 NOTE — Addendum Note (Signed)
Addended by: Judi Cong on: 04/26/2023 03:49 PM   Modules accepted: Orders

## 2023-04-26 NOTE — Telephone Encounter (Signed)
Pt's wife called and stated that the pressure is to high for the pt. Please advise.

## 2023-04-30 ENCOUNTER — Other Ambulatory Visit: Payer: Self-pay | Admitting: Neurology

## 2023-04-30 DIAGNOSIS — R0609 Other forms of dyspnea: Secondary | ICD-10-CM

## 2023-04-30 DIAGNOSIS — I480 Paroxysmal atrial fibrillation: Secondary | ICD-10-CM

## 2023-04-30 DIAGNOSIS — G4733 Obstructive sleep apnea (adult) (pediatric): Secondary | ICD-10-CM

## 2023-04-30 DIAGNOSIS — K8689 Other specified diseases of pancreas: Secondary | ICD-10-CM

## 2023-04-30 NOTE — Telephone Encounter (Signed)
New, Vickki Muff, RN; Milon Dikes; Dohmeier, Porfirio Mylar, MD; 1 other Received, Thank you!       Previous Messages    ----- Message ----- From: Judi Cong, RN Sent: 04/26/2023   3:50 PM EDT To: Melvyn Novas, MD; Henderson Newcomer; * Subject: CPAP                                          Pt states pressure too high. Please see where minimum pressure was changed in airview and place updated order on file

## 2023-04-30 NOTE — Progress Notes (Signed)
The patient informed the office that he felt his CPAP pressures are too high they are currently starting at 7 cm water pressure with a maximum auto titration pressure of 18 cmH2O and 3 cm expiratory relief.  I will reduce the beginning pressure to 5 cm water and keep the other 2 settings the same.  Should central apnea be emerging I will change this patient probably to a BiPAP machine.

## 2023-05-31 ENCOUNTER — Ambulatory Visit: Payer: Medicare Other | Admitting: Podiatry

## 2023-06-28 DIAGNOSIS — N182 Chronic kidney disease, stage 2 (mild): Secondary | ICD-10-CM | POA: Diagnosis not present

## 2023-06-28 DIAGNOSIS — I10 Essential (primary) hypertension: Secondary | ICD-10-CM | POA: Diagnosis not present

## 2023-06-28 DIAGNOSIS — E782 Mixed hyperlipidemia: Secondary | ICD-10-CM | POA: Diagnosis not present

## 2023-06-28 DIAGNOSIS — R7303 Prediabetes: Secondary | ICD-10-CM | POA: Diagnosis not present

## 2023-07-05 DIAGNOSIS — E782 Mixed hyperlipidemia: Secondary | ICD-10-CM | POA: Diagnosis not present

## 2023-07-05 DIAGNOSIS — I10 Essential (primary) hypertension: Secondary | ICD-10-CM | POA: Diagnosis not present

## 2023-07-05 DIAGNOSIS — N182 Chronic kidney disease, stage 2 (mild): Secondary | ICD-10-CM | POA: Diagnosis not present

## 2023-07-05 DIAGNOSIS — R7989 Other specified abnormal findings of blood chemistry: Secondary | ICD-10-CM | POA: Diagnosis not present

## 2023-07-05 DIAGNOSIS — M5430 Sciatica, unspecified side: Secondary | ICD-10-CM | POA: Diagnosis not present

## 2023-07-05 DIAGNOSIS — K219 Gastro-esophageal reflux disease without esophagitis: Secondary | ICD-10-CM | POA: Diagnosis not present

## 2023-07-05 DIAGNOSIS — Z23 Encounter for immunization: Secondary | ICD-10-CM | POA: Diagnosis not present

## 2023-07-05 DIAGNOSIS — R7303 Prediabetes: Secondary | ICD-10-CM | POA: Diagnosis not present

## 2023-07-12 ENCOUNTER — Ambulatory Visit: Payer: Medicare Other | Admitting: Adult Health

## 2023-07-16 ENCOUNTER — Encounter: Payer: Self-pay | Admitting: Adult Health

## 2023-07-16 ENCOUNTER — Ambulatory Visit: Payer: Medicare Other | Admitting: Adult Health

## 2023-07-16 ENCOUNTER — Telehealth: Payer: Self-pay | Admitting: Adult Health

## 2023-07-16 VITALS — BP 118/65 | HR 54 | Ht 71.0 in | Wt 331.4 lb

## 2023-07-16 DIAGNOSIS — R4189 Other symptoms and signs involving cognitive functions and awareness: Secondary | ICD-10-CM | POA: Diagnosis not present

## 2023-07-16 DIAGNOSIS — G4733 Obstructive sleep apnea (adult) (pediatric): Secondary | ICD-10-CM | POA: Diagnosis not present

## 2023-07-16 MED ORDER — DONEPEZIL HCL 10 MG PO TABS: 10.0000 mg | ORAL_TABLET | Freq: Every day | ORAL | 11 refills | Status: DC

## 2023-07-16 NOTE — Telephone Encounter (Signed)
Referral for neuropsychology sent through [facility] to [facility] to see Dr. John Rodenbough. Phone: [phone]. 

## 2023-07-16 NOTE — Progress Notes (Signed)
PATIENT: Justin Davidson DOB: Apr 11, 1946  REASON FOR VISIT: follow up HISTORY FROM: patient PRIMARY NEUROLOGIST: Dr. Vickey Huger  Chief Complaint  Patient presents with   Follow-up    Pt in 5 with daughter  Pt here for CPAP f/u  Pt states having increased dreams Pt states dreams are vivid Pt states memory same since last visit      HISTORY OF PRESENT ILLNESS: Today 07/16/23:  Justin Davidson is a 77 y.o. male with a history of OSA on CPAP and memory disturbance . Returns today for follow-up. Not using CPAP because it causes vivid dreams and doesn't like wearing the head piece. Daughter is with him today- she does notice some memory disturbance.  She is concerned about him making financial decisions in the future.  She states that he does get agitated easily as he has no patients.  Continues to live at home with his wife.  Able to complete most ADLs independently.  His download is below         REVIEW OF SYSTEMS: Out of a complete 14 system review of symptoms, the patient complains only of the following symptoms, and all other reviewed systems are negative.   ESS 15  ALLERGIES: Allergies  Allergen Reactions   Lisinopril Cough    HOME MEDICATIONS: Outpatient Medications Prior to Visit  Medication Sig Dispense Refill   acetaminophen (TYLENOL) 500 MG tablet Take 500 mg by mouth every 6 (six) hours as needed for mild pain, headache or fever.     albuterol (VENTOLIN HFA) 108 (90 Base) MCG/ACT inhaler Inhale 2 puffs into the lungs every 6 (six) hours as needed for wheezing or shortness of breath. 8 g 1   amLODipine (NORVASC) 10 MG tablet Take 1 tablet (10 mg total) by mouth daily. 90 tablet 3   apixaban (ELIQUIS) 5 MG TABS tablet Take 1 tablet (5 mg total) by mouth 2 (two) times daily. 60 tablet 2   aspirin EC 81 MG tablet Take by mouth.     atorvastatin (LIPITOR) 80 MG tablet TAKE 1 TABLET BY MOUTH DAILY (Patient taking differently: Take 80 mg by mouth daily.) 90 tablet 3    clonazePAM (KLONOPIN) 1 MG tablet Take by mouth.     clopidogrel (PLAVIX) 75 MG tablet Take by mouth.     docusate sodium (COLACE) 100 MG capsule Take 100 mg by mouth 2 (two) times daily as needed (constipation).     donepezil (ARICEPT) 23 MG TABS tablet Take 23 mg by mouth in the morning.     fluticasone (FLONASE) 50 MCG/ACT nasal spray 1 spray Once Daily.     guaiFENesin (ROBITUSSIN) 100 MG/5ML liquid Take 5 mLs by mouth every 4 (four) hours as needed for cough or to loosen phlegm. 120 mL 0   ipratropium (ATROVENT) 0.06 % nasal spray 2 sprays 2 (two) times a day.     ipratropium-albuterol (DUONEB) 0.5-2.5 (3) MG/3ML SOLN Take 3 mLs by nebulization every 6 (six) hours as needed. 360 mL 1   losartan-hydrochlorothiazide (HYZAAR) 50-12.5 MG tablet Take 1 tablet by mouth daily.     meclizine (ANTIVERT) 25 MG tablet      metoprolol tartrate (LOPRESSOR) 50 MG tablet TAKE 1 TABLET BY MOUTH TWICE DAILY (Patient taking differently: Take 50 mg by mouth 2 (two) times daily.) 180 tablet 3   potassium chloride (MICRO-K) 10 MEQ CR capsule Take 10 mEq by mouth in the morning.  5   Psyllium (METAMUCIL FIBER PO) Take 1 Scoop by mouth 2 (  two) times daily.      tamsulosin (FLOMAX) 0.4 MG CAPS capsule Take 0.4 mg by mouth in the morning.     No facility-administered medications prior to visit.    PAST MEDICAL HISTORY: Past Medical History:  Diagnosis Date   Anxiety    Colon cancer (HCC)    Colon polyps    Coronary artery disease    Dementia (HCC)    Dizziness and giddiness    GERD (gastroesophageal reflux disease)    Hypercholesteremia    Hyperglycemia    Hypertension    OSA (obstructive sleep apnea)    CPAP    Pre-diabetes    Prediabetes    diet controlled    PAST SURGICAL HISTORY: Past Surgical History:  Procedure Laterality Date   ADRENALECTOMY Left 12/07/2020   Procedure: LEFT ADRENALECTOMY;  Surgeon: Jannifer Hick, MD;  Location: [facility] OR;  Service: Urology;  Laterality: Left;   BIOPSY   09/13/2020   Procedure: BIOPSY;  Surgeon: Meridee Score Netty Starring., MD;  Location: [facility] ENDOSCOPY;  Service: Gastroenterology;;   cardaic stents     CARDIAC CATHETERIZATION  10/28/2010   CHOLECYSTECTOMY N/A 12/07/2020   Procedure: CHOLECYSTECTOMY;  Surgeon: Fritzi Mandes, MD;  Location: [facility] OR;  Service: General;  Laterality: N/A;   COLONOSCOPY     ESOPHAGOGASTRODUODENOSCOPY (EGD) WITH PROPOFOL N/A 04/19/2020   Procedure: ESOPHAGOGASTRODUODENOSCOPY (EGD) WITH PROPOFOL;  Surgeon: Vida Rigger, MD;  Location: [facility] ENDOSCOPY;  Service: Endoscopy;  Laterality: N/A;   ESOPHAGOGASTRODUODENOSCOPY (EGD) WITH PROPOFOL N/A 09/13/2020   Procedure: ESOPHAGOGASTRODUODENOSCOPY (EGD) WITH PROPOFOL;  Surgeon: Meridee Score Netty Starring., MD;  Location: [facility] ENDOSCOPY;  Service: Gastroenterology;  Laterality: N/A;   ESOPHAGOGASTRODUODENOSCOPY (EGD) WITH PROPOFOL N/A 05/31/2021   Procedure: ESOPHAGOGASTRODUODENOSCOPY (EGD) WITH PROPOFOL;  Surgeon: Vida Rigger, MD;  Location: [facility] ENDOSCOPY;  Service: Endoscopy;  Laterality: N/A;  please have side viewing scope available   EUS  09/13/2020   Procedure: UPPER ENDOSCOPIC ULTRASOUND (EUS) RADIAL;  Surgeon: Lemar Lofty., MD;  Location: [facility] ENDOSCOPY;  Service: Gastroenterology;;   HOT HEMOSTASIS N/A 04/19/2020   Procedure: HOT HEMOSTASIS (ARGON PLASMA COAGULATION/BICAP);  Surgeon: Vida Rigger, MD;  Location: [facility] ENDOSCOPY;  Service: Endoscopy;  Laterality: N/A;   NEPHRECTOMY Left 12/07/2020   Procedure: OPEN LEFT RADICAL NEPHRECTOMY;  Surgeon: Jannifer Hick, MD;  Location: [facility] OR;  Service: Urology;  Laterality: Left;   PARTIAL COLECTOMY  1996   colon cancer   POLYPECTOMY  05/31/2021   Procedure: POLYPECTOMY;  Surgeon: Vida Rigger, MD;  Location: [facility] ENDOSCOPY;  Service: Endoscopy;;   WHIPPLE PROCEDURE N/A 12/07/2020   Procedure: OPEN TRANSDUODENAL AMPULLECTOMY;  Surgeon: Fritzi Mandes, MD;  Location: [facility] OR;  Service: General;  Laterality: N/A;    FAMILY HISTORY: Family  History  Problem Relation Age of Onset   Stroke Mother    Diabetes Mother    Hypertension Father    Hyperlipidemia Father    Seizures Father    Diabetes Father    Hypertension Sister    Hyperlipidemia Sister    Heart disease Paternal Grandfather    Diabetes Paternal Grandfather    GER disease Daughter    Colon cancer Neg Hx    Esophageal cancer Neg Hx    Inflammatory bowel disease Neg Hx    Liver disease Neg Hx    Pancreatic cancer Neg Hx    Rectal cancer Neg Hx    Stomach cancer Neg Hx    Sleep apnea Neg Hx    Alzheimer's disease Neg Hx  Dementia Neg Hx     SOCIAL HISTORY: Social History   Socioeconomic History   Marital status: Married    Spouse name: [name]   Number of children: 3   Years of education: Not on file   Highest education level: 10th grade  Occupational History   Occupation: retired  Tobacco Use   Smoking status: Never   Smokeless tobacco: Never  Vaping Use   Vaping status: Never Used  Substance and Sexual Activity   Alcohol use: Yes    Comment: occasional   Drug use: Not Currently   Sexual activity: Not on file  Other Topics Concern   Not on file  Social History Narrative   Lives with wife    Right handed   Caffeine: 1 mug of coffee a day   Social Determinants of Corporate investment banker Strain: Not on file  Food Insecurity: Not on file  Transportation Needs: Not on file  Physical Activity: Not on file  Stress: Not on file  Social Connections: Not on file  Intimate Partner Violence: Not on file      PHYSICAL EXAM  Vitals:   07/16/23 1123  BP: 118/65  Pulse: (!) 54  Weight: (!) 331 lb 6.4 oz (150.3 kg)  Height: 5\' 11"  (1.803 m)   Body mass index is 46.22 kg/m.     07/16/2023   11:30 AM 07/13/2022    2:22 PM  MMSE - Mini Mental State Exam  Orientation to time 3 5  Orientation to Place 4 5  Registration 3 3  Attention/ Calculation 1 1  Recall 2 1  Language- name 2 objects 2 2  Language- repeat 1 1  Language-  follow 3 step command 3 3  Language- read & follow direction 1 1  Write a sentence 1 1  Copy design 0 0  Total score 21 23     Generalized: Well developed, in no acute distress  Chest: Lungs clear to auscultation bilaterally  Neurological examination  Mentation: Alert oriented to time, place, history taking. Follows all commands speech and language fluent Cranial nerve II-XII: Facial symmetry noted Sensory: Sensory testing is intact to soft touch on all 4 extremities. No evidence of extinction is noted.  Gait and station: Gait is normal.    DIAGNOSTIC DATA (LABS, IMAGING, TESTING) - I reviewed patient records, labs, notes, testing and imaging myself where available.  Lab Results  Component Value Date   WBC 9.0 11/02/2022   HGB 13.3 11/02/2022   HCT 40.2 11/02/2022   MCV 95.0 11/02/2022   PLT 401 (H) 11/02/2022      Component Value Date/Time   NA 136 11/02/2022 0215   NA 142 07/06/2021 1349   K 4.2 11/02/2022 0215   CL 96 (L) 11/02/2022 0215   CO2 27 11/02/2022 0215   GLUCOSE 101 (H) 11/02/2022 0215   BUN 18 11/02/2022 0215   BUN 17 07/06/2021 1349   CREATININE 1.43 (H) 11/02/2022 0215   CALCIUM 9.1 11/02/2022 0215   PROT 5.6 (L) 12/11/2020 0216   ALBUMIN 2.4 (L) 12/11/2020 0216   AST 31 12/11/2020 0216   ALT 32 12/11/2020 0216   ALKPHOS 60 12/11/2020 0216   BILITOT 1.2 12/11/2020 0216   GFRNONAA 50 (L) 11/02/2022 0215   GFRAA >60 09/13/2020 1350   No results found for: "CHOL", "HDL", "LDLCALC", "LDLDIRECT", "TRIG", "CHOLHDL" Lab Results  Component Value Date   HGBA1C 5.5 12/08/2020   No results found for: "VITAMINB12" No results found for: "TSH"  ASSESSMENT AND PLAN 77 y.o. year old male  has a past medical history of Anxiety, Colon cancer (HCC), Colon polyps, Coronary artery disease, Dementia (HCC), Dizziness and giddiness, GERD (gastroesophageal reflux disease), Hypercholesteremia, Hyperglycemia, Hypertension, OSA (obstructive sleep apnea),  Pre-diabetes, and Prediabetes. here with:  OSA on CPAP Memory disturbance  - Noncompliant with CPAP therapy - Encourage patient to retry CPAP discussed potential mask refitting however he deferred. - Will decrease Aricept to 10 mg daily to see if this decreases vivid dreams -Will send to neuropsychology for memory evaluation.  Daughter is concerned about his competency in making financial decisions. -Follow-up in 6 months or sooner if needed    Butch Penny, MSN, NP-C 07/16/2023, 11:35 AM [facility] [address] [place], [place] [address] [phone]

## 2023-07-16 NOTE — Patient Instructions (Addendum)
Restart CPAP nightly and greater than 4 hours each night Decrease Aricept 10 mg tablet daily in the AM.  Will retest memory in 6 months Order sent for neuropsychological testing for memory testing If your symptoms worsen or you develop new symptoms please let us know.

## 2023-07-23 ENCOUNTER — Encounter: Payer: Self-pay | Admitting: Cardiology

## 2023-07-23 ENCOUNTER — Ambulatory Visit: Payer: Medicare Other | Admitting: Cardiology

## 2023-07-23 VITALS — BP 127/73 | HR 71 | Resp 16 | Ht 71.0 in | Wt 330.8 lb

## 2023-07-23 DIAGNOSIS — I2510 Atherosclerotic heart disease of native coronary artery without angina pectoris: Secondary | ICD-10-CM | POA: Diagnosis not present

## 2023-07-23 DIAGNOSIS — I10 Essential (primary) hypertension: Secondary | ICD-10-CM | POA: Diagnosis not present

## 2023-07-23 DIAGNOSIS — E7800 Pure hypercholesterolemia, unspecified: Secondary | ICD-10-CM | POA: Diagnosis not present

## 2023-07-23 DIAGNOSIS — I6523 Occlusion and stenosis of bilateral carotid arteries: Secondary | ICD-10-CM | POA: Diagnosis not present

## 2023-07-23 MED ORDER — ROSUVASTATIN CALCIUM 40 MG PO TABS: 40.0000 mg | ORAL_TABLET | Freq: Every day | ORAL | 2 refills | Status: DC

## 2023-07-23 NOTE — Progress Notes (Unsigned)
Primary Physician/Referring:  Georgianne Fick, MD  Patient ID: Justin Davidson, male    DOB: [birth year], 77 y.o.   MRN: [id number]  Chief Complaint  Patient presents with   Coronary artery disease involving native coronary artery of   Follow-up   Hypertension   HPI:    Justin Davidson  is a 77 y.o. AAM with coronary artery disease status post left circumflex and ramus intermedius PCI in 2011, residual moderate disease in mid LAD, remote history of colon cancer status post colon resection and chemotherapy, benign periampullary adenoma SP transduodenal ampullectomy in 2021 and also left nephrectomy at the same time for primary renal cell carcinoma, hypertension, hyperlipidemia, hyperglycemia, severe objective sleep apnea not on CPAP, asymptomatic mild bilateral carotid artery stenosis.  This annual visit for follow-up. Presently doing well and except for chronic dyspnea, no other symptoms.  Justin Davidson has not used any sublingual nitroglycerin and has not had any recurrence of chest pain.  Denies leg edema, PND or orthopnea.  Has been using CPAP on and off.  Past Medical History:  Diagnosis Date   Anxiety    Colon cancer (HCC)    Colon polyps    Coronary artery disease    Dementia (HCC)    Dizziness and giddiness    GERD (gastroesophageal reflux disease)    Hypercholesteremia    Hyperglycemia    Hypertension    OSA (obstructive sleep apnea)    CPAP    Pre-diabetes    Prediabetes    diet controlled    Social History   Tobacco Use   Smoking status: Never   Smokeless tobacco: Never  Substance Use Topics   Alcohol use: Yes    Comment: occasional  Marital Status: Married    ROS  Review of Systems  Cardiovascular:  Positive for dyspnea on exertion (stable). Negative for leg swelling and syncope.  Respiratory:  Positive for snoring (severe sleep apnea, not using CPAP).    Objective      07/23/2023   11:11 AM 07/16/2023   11:23 AM 11/02/2022    9:04 AM  Vitals with BMI  Height 5'  11" 5\' 11"    Weight 330 lbs 13 oz 331 lbs 6 oz   BMI 46.16 46.24   Systolic 127 118 962  Diastolic 73 65 60  Pulse 71 54       Physical Exam Constitutional:      Appearance: Justin Davidson is well-developed.     Comments: Morbidly obese  Neck:     Vascular: No JVD.     Comments: Short neck and difficult to evaluate JVP Cardiovascular:     Rate and Rhythm: Normal rate and regular rhythm.     Pulses:          Carotid pulses are  on the right side with bruit and  on the left side with bruit.      Dorsalis pedis pulses are 2+ on the right side and 2+ on the left side.       Posterior tibial pulses are 2+ on the right side and 2+ on the left side.     Heart sounds: Heart sounds are distant. Murmur heard.     Midsystolic murmur is present with a grade of 2/6 at the upper right sternal border radiating to the neck.     No gallop.     Comments: Femoral and popliteal pulse difficult to feel due to patient's body habitus.  Pulmonary:     Effort: Pulmonary effort is normal.  Breath sounds: Normal breath sounds.  Abdominal:     General: Bowel sounds are normal.     Palpations: Abdomen is soft.     Comments: Obese. Pannus present  Musculoskeletal:     Right lower leg: No edema.     Left lower leg: No edema.    Laboratory examination:   External labs:  Cholesterol, total 152.000 m 06/28/2023 HDL 38.000 mg 06/28/2023 LDL 86.000 mg 06/28/2023 Triglycerides 164.000 m 06/28/2023  A1C 6.300 % 05/19/2022 TSH 4.500 08/30/2021  Hemoglobin 13.300 g/d 11/02/2022 Platelets 401.000 K/ 11/02/2022  Creatinine, Serum 1.200 mg/ 02/16/2023 Potassium 4.200 mm 11/02/2022 ALT (SGPT) 62.000 IU/ 12/01/2021  Labs 05/20/2022:  A1c 6.3%.  Total cholesterol 145, triglycerides 189, HDL 35, LDL 72.  121, creatinine 1.46, EGFR 53 mL, potassium 3.8.  LFTs normal.  Radiology:  No results found. Cardiac Studies:   Coronary angiogram 10/28/10:  3.5x23, Promus mid CX and 2.5x23 Promus mid Ramus intermediate (DES).  Residual 50% -60% Mid LAD.  Lexiscan (Walking with mod Bruce)Tetrofosmin Stress Test  10/25/2020: Nondiagnostic ECG stress. There is diaphragmatic attenuation noted in the inferior wall. There is a fixed defect in the inferior region from base to the apex without reversibility suggestive of a large scar.  No stress lung uptake. TID is normal. No stress lung uptake.  Overall LV systolic function is normal with regional wall motion abnormality with akinesis in the inferior wall. Stress LV EF: 53%.  No significant change from 04/12/2018. Intermediate risk study.   Echocardiogram 11/11/2020: Left ventricle cavity is normal in size and wall thickness. Normal global wall motion. Normal LV systolic function with EF 55%. Doppler evidence of grade I (impaired) diastolic dysfunction, normal LAP. Structurally normal trileaflet aortic valve.  Mild (Grade I) aortic regurgitation. No evidence of pulmonary hypertension. Unlike previous study in 2019, pulmonary hypertension not seen on this study.  Carotid artery duplex 06/30/2021: Duplex suggests stenosis in the right internal carotid artery (16-49%). Duplex suggests stenosis in the right external carotid artery (<50%). Duplex suggests stenosis in the left internal carotid artery (16-49%). Duplex suggests stenosis in the left external carotid artery (<50%). Antegrade right vertebral artery flow. Left vertebral artery flow is not visualized. Follow up in one year is appropriate if clinically indicated.  Echocardiogram 06/29/2022: Normal LV systolic function with visual EF 60-65%. Left ventricle cavity is normal in size. Mild left ventricular hypertrophy. Normal global wall motion. Normal diastolic filling pattern, normal LAP.  Mild (Grade I) aortic regurgitation. Mild tricuspid regurgitation. No evidence of pulmonary hypertension. The aortic root is normal. 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 Compared to 11/11/2020 Grade 1 DD is now normal,   mildly dilated proximal ascending is new otherwise no significant change.   EKG:  EKG 07/23/2023: Normal sinus rhythm with rate of 56 bpm, normal axis, no evidence of ischemia, normal EKG.  Compared to 06/21/2022, no significant change.  Medications and allergies   Allergies  Allergen Reactions   Lisinopril Cough    Current Outpatient Medications:    acetaminophen (TYLENOL) 500 MG tablet, Take 500 mg by mouth every 6 (six) hours as needed for mild pain, headache or fever., Disp: , Rfl:    amLODipine (NORVASC) 10 MG tablet, Take 1 tablet (10 mg total) by mouth daily., Disp: 90 tablet, Rfl: 3   aspirin EC 81 MG tablet, Take by mouth., Disp: , Rfl:    donepezil (ARICEPT) 10 MG tablet, Take 1 tablet (10 mg total) by mouth daily. In the morning, Disp: 30 tablet, Rfl:  11   ipratropium-albuterol (DUONEB) 0.5-2.5 (3) MG/3ML SOLN, Take 3 mLs by nebulization every 6 (six) hours as needed., Disp: 360 mL, Rfl: 1   losartan-hydrochlorothiazide (HYZAAR) 50-12.5 MG tablet, Take 1 tablet by mouth daily., Disp: , Rfl:    metoprolol tartrate (LOPRESSOR) 50 MG tablet, TAKE 1 TABLET BY MOUTH TWICE DAILY (Patient taking differently: Take 50 mg by mouth 2 (two) times daily.), Disp: 180 tablet, Rfl: 3   potassium chloride (MICRO-K) 10 MEQ CR capsule, Take 10 mEq by mouth in the morning., Disp: , Rfl: 5   Psyllium (METAMUCIL FIBER PO), Take 1 Scoop by mouth 2 (two) times daily. , Disp: , Rfl:    rosuvastatin (CRESTOR) 40 MG tablet, Take 1 tablet (40 mg total) by mouth daily., Disp: 30 tablet, Rfl: 2   tamsulosin (FLOMAX) 0.4 MG CAPS capsule, Take 0.4 mg by mouth in the morning., Disp: , Rfl:    clonazePAM (KLONOPIN) 1 MG tablet, Take by mouth., Disp: , Rfl:    docusate sodium (COLACE) 100 MG capsule, Take 100 mg by mouth 2 (two) times daily as needed (constipation)., Disp: , Rfl:    meclizine (ANTIVERT) 25 MG tablet, , Disp: , Rfl:    Assessment     ICD-10-CM   1. Coronary artery disease involving native  coronary artery of native heart without angina pectoris  I25.10 EKG 12-Lead    rosuvastatin (CRESTOR) 40 MG tablet    2. Essential hypertension  I10     3. Hypercholesteremia  E78.00 rosuvastatin (CRESTOR) 40 MG tablet    Lipid Panel With LDL/HDL Ratio    4. Asymptomatic bilateral carotid artery stenosis  I65.23 PCV CAROTID DUPLEX (BILATERAL)      Medications Discontinued During This Encounter  Medication Reason   albuterol (VENTOLIN HFA) 108 (90 Base) MCG/ACT inhaler    ipratropium (ATROVENT) 0.06 % nasal spray    fluticasone (FLONASE) 50 MCG/ACT nasal spray    guaiFENesin (ROBITUSSIN) 100 MG/5ML liquid    clopidogrel (PLAVIX) 75 MG tablet    apixaban (ELIQUIS) 5 MG TABS tablet    atorvastatin (LIPITOR) 80 MG tablet Change in therapy     Meds ordered this encounter  Medications   rosuvastatin (CRESTOR) 40 MG tablet    Sig: Take 1 tablet (40 mg total) by mouth daily.    Dispense:  30 tablet    Refill:  2    Discontinue atorvastatin 80 mg daily   Recommendations:    Justin Davidson is a 77 y.o. AAM with coronary artery disease status post left circumflex and ramus intermedius PCI in 2011, residual moderate disease in mid LAD, remote history of colon cancer status post colon resection and chemotherapy, benign periampullary adenoma SP transduodenal ampullectomy in 2021 and also left nephrectomy at the same time for primary renal cell carcinoma, hypertension, hyperlipidemia, hyperglycemia, severe objective sleep apnea not on regular CPAP, asymptomatic mild bilateral carotid artery stenosis presents here for ANNUAL visit.  1. Coronary artery disease involving native coronary artery of native heart without angina pectoris Patient is presently doing well and has not had any recurrence of angina pectoris.  Chronic dyspnea has remained stable, unfortunately has gained about 15 pounds in weight over the last 1 year.  His activity level has decreased over time. - EKG 12-Lead - rosuvastatin  (CRESTOR) 40 MG tablet; Take 1 tablet (40 mg total) by mouth daily.  Dispense: 30 tablet; Refill: 2  2. Essential hypertension Blood pressure is under excellent control.  Justin Davidson is presently on amlodipine and  losartan with excellent control of hypertension.  3. Hypercholesteremia His lipids were well-controlled previously, suspect with weight gain his LDL is now >70.  Goal LDL <70, will discontinue atorvastatin 80 mg and switch him to rosuvastatin 40 mg daily and recheck lipids in 6 to 8 weeks.  - rosuvastatin (CRESTOR) 40 MG tablet; Take 1 tablet (40 mg total) by mouth daily.  Dispense: 30 tablet; Refill: 2 - Lipid Panel With LDL/HDL Ratio  4. Asymptomatic bilateral carotid artery stenosis Justin Davidson needs carotid artery surveillance duplex.  This will be scheduled.  Otherwise patient remained stable from cardiac standpoint, encouraged him to use CPAP on a regular basis, I will see him back in a year or sooner if problems. - PCV CAROTID DUPLEX (BILATERAL); Future    Yates Decamp, MD, [facility] 07/23/2023, 11:58 AM Office: [phone] Pager: [phone]

## 2023-07-23 NOTE — Patient Instructions (Signed)
Please get lab work done in 6 to 8 weeks at [facility] to check your cholesterol.

## 2023-07-25 DIAGNOSIS — G4733 Obstructive sleep apnea (adult) (pediatric): Secondary | ICD-10-CM | POA: Diagnosis not present

## 2023-08-09 ENCOUNTER — Ambulatory Visit: Payer: Medicare Other

## 2023-08-09 DIAGNOSIS — I6523 Occlusion and stenosis of bilateral carotid arteries: Secondary | ICD-10-CM | POA: Diagnosis not present

## 2023-08-14 NOTE — Progress Notes (Signed)
Carotid artery duplex 08/09/2023: Duplex suggests stenosis in the right internal carotid artery (minimal). Duplex suggests stenosis in the left internal carotid artery (minimal). Mild heterogeneous plaque noted in bilateral carotid arteries. Antegrade right vertebral artery flow. Antegrade left vertebral artery flow.  Let patient know that there is no blockage in the carotid arteries

## 2023-08-15 NOTE — Progress Notes (Signed)
Called patient, NA, LMAM

## 2023-08-17 NOTE — Progress Notes (Signed)
Called pt no answer left a vm

## 2023-08-21 ENCOUNTER — Encounter: Payer: Self-pay | Admitting: Psychology

## 2023-08-28 NOTE — Progress Notes (Signed)
Called and spoke with patient regarding his CAD results.

## 2023-08-30 ENCOUNTER — Other Ambulatory Visit: Payer: Self-pay | Admitting: Internal Medicine

## 2023-08-30 DIAGNOSIS — K8689 Other specified diseases of pancreas: Secondary | ICD-10-CM

## 2023-09-28 ENCOUNTER — Other Ambulatory Visit: Payer: Self-pay | Admitting: Cardiology

## 2023-09-28 DIAGNOSIS — E7800 Pure hypercholesterolemia, unspecified: Secondary | ICD-10-CM

## 2023-09-28 DIAGNOSIS — I2510 Atherosclerotic heart disease of native coronary artery without angina pectoris: Secondary | ICD-10-CM

## 2023-10-17 DIAGNOSIS — Z0000 Encounter for general adult medical examination without abnormal findings: Secondary | ICD-10-CM | POA: Diagnosis not present

## 2023-10-17 DIAGNOSIS — R7303 Prediabetes: Secondary | ICD-10-CM | POA: Diagnosis not present

## 2023-10-17 DIAGNOSIS — E782 Mixed hyperlipidemia: Secondary | ICD-10-CM | POA: Diagnosis not present

## 2023-10-17 DIAGNOSIS — N182 Chronic kidney disease, stage 2 (mild): Secondary | ICD-10-CM | POA: Diagnosis not present

## 2023-10-17 DIAGNOSIS — Z23 Encounter for immunization: Secondary | ICD-10-CM | POA: Diagnosis not present

## 2023-10-20 ENCOUNTER — Ambulatory Visit
Admission: RE | Admit: 2023-10-20 | Discharge: 2023-10-20 | Disposition: A | Discharge status: Home / Self Care | Payer: Medicare Other | Source: Ambulatory Visit | Attending: Internal Medicine | Admitting: Internal Medicine

## 2023-10-20 DIAGNOSIS — K862 Cyst of pancreas: Secondary | ICD-10-CM | POA: Diagnosis not present

## 2023-10-20 DIAGNOSIS — K8689 Other specified diseases of pancreas: Secondary | ICD-10-CM

## 2023-10-20 MED ORDER — GADOPICLENOL 0.5 MMOL/ML IV SOLN
10.0000 mL | Freq: Once | INTRAVENOUS | Status: AC | PRN
  Administered 2023-10-20: 10 mL via INTRAVENOUS

## 2023-10-23 DIAGNOSIS — N182 Chronic kidney disease, stage 2 (mild): Secondary | ICD-10-CM | POA: Diagnosis not present

## 2023-10-23 DIAGNOSIS — Z0000 Encounter for general adult medical examination without abnormal findings: Secondary | ICD-10-CM | POA: Diagnosis not present

## 2023-10-23 DIAGNOSIS — K219 Gastro-esophageal reflux disease without esophagitis: Secondary | ICD-10-CM | POA: Diagnosis not present

## 2023-10-23 DIAGNOSIS — I10 Essential (primary) hypertension: Secondary | ICD-10-CM | POA: Diagnosis not present

## 2023-10-23 DIAGNOSIS — E1165 Type 2 diabetes mellitus with hyperglycemia: Secondary | ICD-10-CM | POA: Diagnosis not present

## 2023-10-23 DIAGNOSIS — R7989 Other specified abnormal findings of blood chemistry: Secondary | ICD-10-CM | POA: Diagnosis not present

## 2023-10-23 DIAGNOSIS — E1121 Type 2 diabetes mellitus with diabetic nephropathy: Secondary | ICD-10-CM | POA: Diagnosis not present

## 2023-10-23 DIAGNOSIS — E782 Mixed hyperlipidemia: Secondary | ICD-10-CM | POA: Diagnosis not present

## 2023-10-31 DIAGNOSIS — E1165 Type 2 diabetes mellitus with hyperglycemia: Secondary | ICD-10-CM | POA: Diagnosis not present

## 2023-10-31 DIAGNOSIS — N182 Chronic kidney disease, stage 2 (mild): Secondary | ICD-10-CM | POA: Diagnosis not present

## 2023-10-31 DIAGNOSIS — E782 Mixed hyperlipidemia: Secondary | ICD-10-CM | POA: Diagnosis not present

## 2023-11-23 ENCOUNTER — Encounter: Payer: Self-pay | Admitting: Podiatry

## 2023-11-23 ENCOUNTER — Ambulatory Visit: Payer: Medicare Other | Admitting: Podiatry

## 2023-11-23 DIAGNOSIS — M79674 Pain in right toe(s): Secondary | ICD-10-CM | POA: Diagnosis not present

## 2023-11-23 DIAGNOSIS — M79675 Pain in left toe(s): Secondary | ICD-10-CM | POA: Diagnosis not present

## 2023-11-23 DIAGNOSIS — B351 Tinea unguium: Secondary | ICD-10-CM

## 2023-11-26 NOTE — Progress Notes (Signed)
Subjective:   Patient ID: Justin Davidson, male   DOB: 77 y.o.   MRN: [id number]   HPI Patient presents with elongated nails 1-5 both feet that have become thick again are painful and he cannot take care of   ROS      Objective:  Physical Exam  Neurovascular status intact thick yellow brittle nailbeds 1-5 both feet painful     Assessment:  Chronic mycotic nail infection with pain 1-5 both feet     Plan:  Reviewed and debrided nailbeds 1-5 both feet Neutra genic bleeding reappoint routine care

## 2023-11-30 DIAGNOSIS — I2510 Atherosclerotic heart disease of native coronary artery without angina pectoris: Secondary | ICD-10-CM | POA: Diagnosis not present

## 2023-11-30 DIAGNOSIS — E782 Mixed hyperlipidemia: Secondary | ICD-10-CM | POA: Diagnosis not present

## 2023-11-30 DIAGNOSIS — E1165 Type 2 diabetes mellitus with hyperglycemia: Secondary | ICD-10-CM | POA: Diagnosis not present

## 2023-11-30 DIAGNOSIS — K219 Gastro-esophageal reflux disease without esophagitis: Secondary | ICD-10-CM | POA: Diagnosis not present

## 2023-11-30 DIAGNOSIS — N182 Chronic kidney disease, stage 2 (mild): Secondary | ICD-10-CM | POA: Diagnosis not present

## 2023-12-19 DIAGNOSIS — D49512 Neoplasm of unspecified behavior of left kidney: Secondary | ICD-10-CM | POA: Diagnosis not present

## 2023-12-24 DIAGNOSIS — C642 Malignant neoplasm of left kidney, except renal pelvis: Secondary | ICD-10-CM | POA: Diagnosis not present

## 2023-12-24 DIAGNOSIS — D49512 Neoplasm of unspecified behavior of left kidney: Secondary | ICD-10-CM | POA: Diagnosis not present

## 2023-12-24 DIAGNOSIS — I700 Atherosclerosis of aorta: Secondary | ICD-10-CM | POA: Diagnosis not present

## 2023-12-24 DIAGNOSIS — Z905 Acquired absence of kidney: Secondary | ICD-10-CM | POA: Diagnosis not present

## 2024-01-03 ENCOUNTER — Other Ambulatory Visit: Payer: Self-pay

## 2024-01-03 ENCOUNTER — Emergency Department (HOSPITAL_BASED_OUTPATIENT_CLINIC_OR_DEPARTMENT_OTHER): Payer: Medicare Other

## 2024-01-03 ENCOUNTER — Emergency Department (HOSPITAL_BASED_OUTPATIENT_CLINIC_OR_DEPARTMENT_OTHER)
Admission: EM | Admit: 2024-01-03 | Discharge: 2024-01-03 | Disposition: A | Discharge status: Home / Self Care | Payer: Medicare Other | Attending: Emergency Medicine | Admitting: Emergency Medicine

## 2024-01-03 ENCOUNTER — Other Ambulatory Visit (HOSPITAL_BASED_OUTPATIENT_CLINIC_OR_DEPARTMENT_OTHER): Payer: Self-pay

## 2024-01-03 ENCOUNTER — Encounter (HOSPITAL_BASED_OUTPATIENT_CLINIC_OR_DEPARTMENT_OTHER): Payer: Self-pay

## 2024-01-03 DIAGNOSIS — R1031 Right lower quadrant pain: Secondary | ICD-10-CM | POA: Insufficient documentation

## 2024-01-03 DIAGNOSIS — Z7982 Long term (current) use of aspirin: Secondary | ICD-10-CM | POA: Insufficient documentation

## 2024-01-03 DIAGNOSIS — Z7984 Long term (current) use of oral hypoglycemic drugs: Secondary | ICD-10-CM | POA: Diagnosis not present

## 2024-01-03 DIAGNOSIS — F0390 Unspecified dementia without behavioral disturbance: Secondary | ICD-10-CM | POA: Diagnosis not present

## 2024-01-03 DIAGNOSIS — J449 Chronic obstructive pulmonary disease, unspecified: Secondary | ICD-10-CM | POA: Diagnosis not present

## 2024-01-03 DIAGNOSIS — R109 Unspecified abdominal pain: Secondary | ICD-10-CM

## 2024-01-03 DIAGNOSIS — Z79899 Other long term (current) drug therapy: Secondary | ICD-10-CM | POA: Diagnosis not present

## 2024-01-03 DIAGNOSIS — Z85528 Personal history of other malignant neoplasm of kidney: Secondary | ICD-10-CM | POA: Insufficient documentation

## 2024-01-03 LAB — URINALYSIS, ROUTINE W REFLEX MICROSCOPIC
Bacteria, UA: NONE SEEN
Bilirubin Urine: NEGATIVE
Glucose, UA: NEGATIVE mg/dL
Hgb urine dipstick: NEGATIVE
Ketones, ur: NEGATIVE mg/dL
Leukocytes,Ua: NEGATIVE
Nitrite: NEGATIVE
Protein, ur: NEGATIVE mg/dL
Specific Gravity, Urine: 1.025 (ref 1.005–1.030)
pH: 5 (ref 5.0–8.0)

## 2024-01-03 LAB — COMPREHENSIVE METABOLIC PANEL
ALT: 24 U/L (ref 0–44)
AST: 18 U/L (ref 15–41)
Albumin: 3.6 g/dL (ref 3.5–5.0)
Alkaline Phosphatase: 59 U/L (ref 38–126)
Anion gap: 10 (ref 5–15)
BUN: 17 mg/dL (ref 8–23)
CO2: 27 mmol/L (ref 22–32)
Calcium: 9.3 mg/dL (ref 8.9–10.3)
Chloride: 105 mmol/L (ref 98–111)
Creatinine, Ser: 1.26 mg/dL — ABNORMAL HIGH (ref 0.61–1.24)
GFR, Estimated: 58 mL/min — ABNORMAL LOW (ref >60)
Glucose, Bld: 103 mg/dL — ABNORMAL HIGH (ref 70–99)
Potassium: 3.6 mmol/L (ref 3.5–5.1)
Sodium: 142 mmol/L (ref 135–145)
Total Bilirubin: 0.7 mg/dL (ref 0.0–1.2)
Total Protein: 6.1 g/dL — ABNORMAL LOW (ref 6.5–8.1)

## 2024-01-03 LAB — CBC WITH DIFFERENTIAL/PLATELET
Abs Immature Granulocytes: 0.03 10*3/uL (ref 0.00–0.07)
Basophils Absolute: 0 10*3/uL (ref 0.0–0.1)
Basophils Relative: 1 %
Eosinophils Absolute: 0.3 10*3/uL (ref 0.0–0.5)
Eosinophils Relative: 4 %
HCT: 43.7 % (ref 39.0–52.0)
Hemoglobin: 14.3 g/dL (ref 13.0–17.0)
Immature Granulocytes: 0 %
Lymphocytes Relative: 22 %
Lymphs Abs: 1.9 10*3/uL (ref 0.7–4.0)
MCH: 30.7 pg (ref 26.0–34.0)
MCHC: 32.7 g/dL (ref 30.0–36.0)
MCV: 93.8 fL (ref 80.0–100.0)
Monocytes Absolute: 0.8 10*3/uL (ref 0.1–1.0)
Monocytes Relative: 9 %
Neutro Abs: 5.6 10*3/uL (ref 1.7–7.7)
Neutrophils Relative %: 64 %
Platelets: 190 10*3/uL (ref 150–400)
RBC: 4.66 MIL/uL (ref 4.22–5.81)
RDW: 13.6 % (ref 11.5–15.5)
WBC: 8.6 10*3/uL (ref 4.0–10.5)
nRBC: 0 % (ref 0.0–0.2)

## 2024-01-03 LAB — LIPASE, BLOOD: Lipase: <10 U/L — ABNORMAL LOW (ref 11–51)

## 2024-01-03 MED ORDER — LIDOCAINE 5 % EX PTCH
1.0000 | MEDICATED_PATCH | CUTANEOUS | 0 refills | Status: AC
  Filled 2024-01-03: qty 10, 10d supply, fill #0

## 2024-01-03 MED ORDER — IOHEXOL 300 MG/ML  SOLN
100.0000 mL | Freq: Once | INTRAMUSCULAR | Status: AC | PRN
  Administered 2024-01-03: 100 mL via INTRAVENOUS

## 2024-01-03 NOTE — ED Provider Notes (Signed)
[facility] AT [facility] Provider Note   CSN: [id number] Arrival date & time: 01/03/24  0720     History  Chief Complaint  Patient presents with   Flank Pain    Justin Davidson is a 78 y.o. male.   Flank Pain     Patient has a history of obesity duodenal adenoma, dementia, COPD, kidney cancer, pneumonia who presents to the ED with complaints of flank and abdominal pain.  Patient reports having some intermittent episodes of pain sharp in his right flank and abdomen area for years.  However in the last 2 days he has had sharp pain that increasing in severity.  Patient states it feels like an electric shock that goes from the right flank area towards towards the lower abdomen on the right side.  He is not having any trouble urinating.  No fevers.  No vomiting.  No difficulty breathing. Patient has had prior surgery including partial right hemicolectomy, cholecystectomy, and a nephrectomy and adrenalectomy performed on the left side. Home Medications Prior to Admission medications   Medication Sig Start Date End Date Taking? Authorizing Provider  lidocaine (LIDODERM) 5 % Place 1 patch onto the skin daily. Remove & Discard patch within 12 hours or as directed by MD 01/03/24  Yes Linwood Dibbles, MD  acetaminophen (TYLENOL) 500 MG tablet Take 500 mg by mouth every 6 (six) hours as needed for mild pain, headache or fever.    [provider]  amLODipine (NORVASC) 10 MG tablet Take 1 tablet (10 mg total) by mouth daily. 11/10/20   Yates Decamp, MD  aspirin EC 81 MG tablet Take by mouth. 10/04/17   [provider]  clonazePAM Scarlette Calico) 1 MG tablet Take by mouth. 08/05/19   [provider]  docusate sodium (COLACE) 100 MG capsule Take 100 mg by mouth 2 (two) times daily as needed (constipation).    [provider]  donepezil (ARICEPT) 10 MG tablet Take 1 tablet (10 mg total) by mouth daily. In the morning 07/16/23   Butch Penny, NP   ipratropium-albuterol (DUONEB) 0.5-2.5 (3) MG/3ML SOLN Take 3 mLs by nebulization every 6 (six) hours as needed. 11/02/22   Leatha Gilding, MD  losartan-hydrochlorothiazide (HYZAAR) 50-12.5 MG tablet Take 1 tablet by mouth daily. 05/23/22   [provider]  meclizine (ANTIVERT) 25 MG tablet  09/10/17   [provider]  metFORMIN (GLUCOPHAGE-XR) 500 MG 24 hr tablet Take 500 mg by mouth. 10/23/23   [provider]  metoprolol tartrate (LOPRESSOR) 50 MG tablet TAKE 1 TABLET BY MOUTH TWICE DAILY Patient taking differently: Take 50 mg by mouth 2 (two) times daily. 11/02/21   Yates Decamp, MD  potassium chloride (MICRO-K) 10 MEQ CR capsule Take 10 mEq by mouth in the morning. 04/29/18   [provider]  Psyllium (METAMUCIL FIBER PO) Take 1 Scoop by mouth 2 (two) times daily.     [provider]  rosuvastatin (CRESTOR) 40 MG tablet TAKE 1 TABLET BY MOUTH ONCE DAILY 10/01/23   Yates Decamp, MD  tamsulosin (FLOMAX) 0.4 MG CAPS capsule Take 0.4 mg by mouth in the morning. 02/28/19   [provider]      Allergies    Lisinopril    Review of Systems   Review of Systems  Genitourinary:  Positive for flank pain.    Physical Exam Updated Vital Signs BP 128/62   Pulse 70   Temp 97.6 F (36.4 C) (Oral)   Resp 20  Ht 1.803 m (5\' 11" )   Wt (!) 140.6 kg   SpO2 98%   BMI 43.24 kg/m  Physical Exam Vitals and nursing note reviewed.  Constitutional:      Appearance: He is well-developed. He is not diaphoretic.  HENT:     Head: Normocephalic and atraumatic.     Right Ear: External ear normal.     Left Ear: External ear normal.  Eyes:     General: No scleral icterus.       Right eye: No discharge.        Left eye: No discharge.     Conjunctiva/sclera: Conjunctivae normal.  Neck:     Trachea: No tracheal deviation.  Cardiovascular:     Rate and Rhythm: Normal rate and regular rhythm.  Pulmonary:     Effort: Pulmonary effort is normal. No  respiratory distress.     Breath sounds: Normal breath sounds. No stridor. No wheezing or rales.  Abdominal:     General: Bowel sounds are normal. There is no distension.     Palpations: Abdomen is soft.     Tenderness: There is no abdominal tenderness. There is no guarding or rebound.  Musculoskeletal:        General: No tenderness or deformity.     Cervical back: Neck supple.  Skin:    General: Skin is warm and dry.     Findings: No rash.  Neurological:     General: No focal deficit present.     Mental Status: He is alert.     Cranial Nerves: No cranial nerve deficit, dysarthria or facial asymmetry.     Sensory: No sensory deficit.     Motor: No abnormal muscle tone or seizure activity.     Coordination: Coordination normal.  Psychiatric:        Mood and Affect: Mood normal.     ED Results / Procedures / Treatments   Labs (all labs ordered are listed, but only abnormal results are displayed) Labs Reviewed  COMPREHENSIVE METABOLIC PANEL - Abnormal; Notable for the following components:      Result Value   Glucose, Bld 103 (*)    Creatinine, Ser 1.26 (*)    Total Protein 6.1 (*)    GFR, Estimated 58 (*)    All other components within normal limits  LIPASE, BLOOD - Abnormal; Notable for the following components:   Lipase <10 (*)    All other components within normal limits  CBC WITH DIFFERENTIAL/PLATELET  URINALYSIS, ROUTINE W REFLEX MICROSCOPIC    EKG None  Radiology CT ABDOMEN PELVIS W CONTRAST Result Date: 01/03/2024 CLINICAL DATA:  Abdominal pain, acute, nonlocalized right flank and lower right abd pain. History of renal carcinoma. * Tracking Code: BO * EXAM: CT ABDOMEN AND PELVIS WITH CONTRAST TECHNIQUE: Multidetector CT imaging of the abdomen and pelvis was performed using the standard protocol following bolus administration of intravenous contrast. RADIATION DOSE REDUCTION: This exam was performed according to the departmental dose-optimization program which  includes automated exposure control, adjustment of the mA and/or kV according to patient size and/or use of iterative reconstruction technique. CONTRAST:  OMNIPAQUE IOHEXOL 300 MG/ML  SOLN COMPARISON:  MRI abdomen from 10/20/2023. FINDINGS: Lower chest: Redemonstration of atelectatic changes/scarring and associated traction bronchiectasis in the visualized bilateral lower lobes. The lung bases are otherwise clear. No consolidation, lung mass or pleural effusion. The heart is normal in size. No pericardial effusion. There are coronary artery atherosclerotic calcifications, in keeping with coronary artery disease. Hepatobiliary: The  liver is normal in size. Non-cirrhotic configuration. No suspicious mass. Redemonstration of a well-circumscribed 2.7 x 3.7 cm hypoattenuating lesion in the subcapsular left hepatic lobe, segment 4A. Please refer to recent prior MRI for additional details. There is mild prominence of central intrahepatic and extrahepatic bile duct, most likely due to post cholecystectomy status. Gallbladder is surgically absent. Pancreas: There is a well-circumscribed cystic 1.5 x 1.6 x 1.8 cm oval structure in the pancreatic tail region, incompletely characterized on the current examination. The lesion appears essentially unchanged since the recent MRI. Recommend follow-up MRI abdomen as per pancreatic mass protocol in 1 year to document stability. Otherwise unremarkable pancreas. No pancreatic ductal dilatation or surrounding inflammatory changes. Spleen: Within normal limits. No focal lesion. Adrenals/Urinary Tract: History of left radical nephrectomy. Unremarkable right adrenal gland. No suspicious mass in the right kidney. No right hydroureteronephrosis or nephroureterolithiasis. Note is made of fat stranding and indistinct wall of right renal pelvis which is grossly similar to prior studies. Correlate clinically and with urinalysis to exclude superimposed urinary tract infection. Partially  distended urinary bladder. There is a 1.0 x 1.2 cm diverticulum arising from the left superior bladder wall. Otherwise unremarkable urinary bladder. Stomach/Bowel: Patient is status post right hemicolectomy. Ileocolonic anastomosis noted in the left mid abdomen. No disproportionate dilation of the small or large bowel loops. No evidence of abnormal bowel wall thickening or inflammatory changes. There are multiple diverticula mainly in the left hemi colon, without imaging signs of diverticulitis. Vascular/Lymphatic: No ascites or pneumoperitoneum. No abdominal or pelvic lymphadenopathy, by size criteria. No aneurysmal dilation of the major abdominal arteries. There are moderate peripheral atherosclerotic vascular calcifications of the aorta and its major branches. Reproductive: Enlarged prostate. Symmetric seminal vesicles. Other: Lower abdominal midline anterior surgical scar noted. The soft tissues and abdominal wall are otherwise unremarkable. Musculoskeletal: No suspicious osseous lesions. There are mild multilevel degenerative changes in the visualized spine. IMPRESSION: 1. Mild fat stranding and indistinct wall of right renal pelvis without hydronephrosis, grossly similar to the prior study. Findings are most likely sequela of previous episodes of urinary tract infection. However, correlate clinically and with urinalysis to exclude superimposed UTI. 2. Otherwise no acute inflammatory process identified within the abdomen or pelvis. 3. No metastatic carcinoma identified within the abdomen or pelvis. 4. There is a well-circumscribed 1.5 x 1.6 x 1.8 cm hypoattenuating structure in the pancreatic tail region. Recommend follow-up MRI abdomen as per pancreatic mass protocol in 1 year to document stability. 5. Multiple other nonacute observations, as described above. Aortic Atherosclerosis (ICD10-I70.0). Electronically Signed   By: Jules Schick M.D.   On: 01/03/2024 09:14    Procedures Procedures     Medications Ordered in ED Medications  iohexol (OMNIPAQUE) 300 MG/ML solution 100 mL (100 mLs Intravenous Contrast Given 01/03/24 1610)    ED Course/ Medical Decision Making/ A&P Clinical Course as of 01/03/24 0939  Thu Jan 03, 2024  0920 CBC normal.  Metabolic panel without significant abnormalities.  Lipase normal.  Urinalysis normal [JK]  0920 CT scan shows mild fat stranding in the right renal pelvis similar to prior studies.  Urinalysis today does not suggest infection.  No acute signs of infection or mass noted.  Pancreatic tail lesion noted.  Follow-up MRI recommended [JK]    Clinical Course User Index [JK] Linwood Dibbles, MD  Medical Decision Making Differential diagnosis includes but not limited to renal mass, ureterolithiasis, appendicitis, musculoskeletal pain  Problems Addressed: Flank pain: acute illness or injury  Amount and/or Complexity of Data Reviewed Labs: ordered. Radiology: ordered.  Risk Prescription drug management.   Patient's ED workup is reassuring.  There are no findings to suggest hepatitis or pancreatitis.  Urinalysis does not suggest infection or ureterolithiasis.  Patient CT scan does not show any acute abnormalities.  A pancreatic tail lesion is once again noted.  Patient did have a recent MRI evaluating that in November of last year.  No clear etiology for his pain.  I suspect this could be musculoskeletal in nature with the complaints of being a sharp electric type pain that is rating from his flank to his abdomen.  No suspicious osseous lesions noted.  Will try a course of Lidoderm patches for pain discomfort.  Recommend follow-up with PCP  Evaluation and diagnostic testing in the emergency department does not suggest an emergent condition requiring admission or immediate intervention beyond what has been performed at this time.  The patient is safe for discharge and has been instructed to return immediately for  worsening symptoms, change in symptoms or any other concerns.        Final Clinical Impression(s) / ED Diagnoses Final diagnoses:  Flank pain    Rx / DC Orders ED Discharge Orders          Ordered    lidocaine (LIDODERM) 5 %  Every 24 hours        01/03/24 0938              Linwood Dibbles, MD 01/03/24 [phone]

## 2024-01-03 NOTE — ED Notes (Signed)
Patient transported to CT 

## 2024-01-03 NOTE — Discharge Instructions (Addendum)
The CT scan did not show any acute abnormalities   There is an incidental nodule noted in the pancreas.  This was seen on your prior MRI.  A follow up MRI is recommended in 1 year to make sure it is stable and not growing.    Try the lidocaine patches for pain.   Follow up with your primary care doctor to be rechecked.

## 2024-01-03 NOTE — ED Triage Notes (Signed)
Pt POV from home c/o increasing right sided thoracic back pain radiating down around to the right lower abd x 2 days Denies urinary s/s. 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

## 2024-01-14 ENCOUNTER — Telehealth: Payer: Self-pay | Admitting: *Deleted

## 2024-01-14 NOTE — Progress Notes (Signed)
Transition Care Management Follow-up Telephone Call Date of discharge and from where: [facility]  01/03/2024 How have you been since you were released from the hospital? Still has some pain but he is following up with his dr Any questions or concerns? No  Items Reviewed: Did the pt receive and understand the discharge instructions provided? Yes  Medications obtained and verified? Yes  Other? No  Any new allergies since your discharge? No  Dietary orders reviewed? No Do you have support at home? Yes      Follow up appointments reviewed:  PCP Hospital f/u appt confirmed? Yes   Are transportation arrangements needed? No  If their condition worsens, is the pt aware to call PCP or go to the Emergency Dept.? Yes Was the patient provided with contact information for the PCP's office or ED? Yes Was to pt encouraged to call back with questions or concerns? Yes

## 2024-01-15 DIAGNOSIS — Z85528 Personal history of other malignant neoplasm of kidney: Secondary | ICD-10-CM | POA: Diagnosis not present

## 2024-01-15 DIAGNOSIS — D49512 Neoplasm of unspecified behavior of left kidney: Secondary | ICD-10-CM | POA: Diagnosis not present

## 2024-01-30 ENCOUNTER — Ambulatory Visit: Payer: Medicare Other | Admitting: Adult Health

## 2024-02-08 DIAGNOSIS — K219 Gastro-esophageal reflux disease without esophagitis: Secondary | ICD-10-CM | POA: Diagnosis not present

## 2024-02-08 DIAGNOSIS — I10 Essential (primary) hypertension: Secondary | ICD-10-CM | POA: Diagnosis not present

## 2024-02-08 DIAGNOSIS — G4733 Obstructive sleep apnea (adult) (pediatric): Secondary | ICD-10-CM | POA: Diagnosis not present

## 2024-02-08 DIAGNOSIS — N182 Chronic kidney disease, stage 2 (mild): Secondary | ICD-10-CM | POA: Diagnosis not present

## 2024-02-08 DIAGNOSIS — E782 Mixed hyperlipidemia: Secondary | ICD-10-CM | POA: Diagnosis not present

## 2024-02-08 DIAGNOSIS — E1165 Type 2 diabetes mellitus with hyperglycemia: Secondary | ICD-10-CM | POA: Diagnosis not present

## 2024-02-08 DIAGNOSIS — I2510 Atherosclerotic heart disease of native coronary artery without angina pectoris: Secondary | ICD-10-CM | POA: Diagnosis not present

## 2024-02-08 DIAGNOSIS — M150 Primary generalized (osteo)arthritis: Secondary | ICD-10-CM | POA: Diagnosis not present

## 2024-03-11 ENCOUNTER — Ambulatory Visit: Payer: Medicare Other | Admitting: Adult Health

## 2024-03-11 ENCOUNTER — Encounter: Payer: Self-pay | Admitting: Adult Health

## 2024-03-11 VITALS — BP 137/70 | HR 58 | Ht 71.0 in | Wt 314.4 lb

## 2024-03-11 DIAGNOSIS — G4733 Obstructive sleep apnea (adult) (pediatric): Secondary | ICD-10-CM

## 2024-03-11 DIAGNOSIS — R4189 Other symptoms and signs involving cognitive functions and awareness: Secondary | ICD-10-CM | POA: Diagnosis not present

## 2024-03-11 NOTE — Progress Notes (Signed)
 PATIENT: Justin Davidson DOB: [birth year]  REASON FOR VISIT: follow up HISTORY FROM: patient PRIMARY NEUROLOGIST: Dr. Vickey Huger  Chief Complaint  Patient presents with   Follow-up    Rm 5, wife.  Not using cpap has a lot dreaming, throws off sometimes.  ESS 10.      HISTORY OF PRESENT ILLNESS: Today 03/11/24:  Justin Davidson is a 78 y.o. male with a history of OSA on CPAP and memory disturbance. Returns today for follow-up.  Reports that he has not been using his CPAP.  He states that he typically snatches it off during the night so he has not been putting it on.  Continues to have vivid dreams.  In regards to his memory he feels that it is better. Wife feels that its stable.  Reports that he is able to complete all ADLs independently.  Never has done household chores. Wife now does the finances but he participates.  Wife reports that he is typically able to drive without getting lost but she does feel that his driving skills is not as good as they used to be.  She has to remind him to stay in the right lane and to not take turns to sharp. Will be seeing Dr. Kieth Brightly in July for memory testing.      07/16/23: Justin Davidson is a 78 y.o. male with a history of OSA on CPAP and memory disturbance . Returns today for follow-up. Not using CPAP because it causes vivid dreams and doesn't like wearing the head piece. Daughter is with him today- she does notice some memory disturbance.  She is concerned about him making financial decisions in the future.  She states that he does get agitated easily as he has no patients.  Continues to live at home with his wife.  Able to complete most ADLs independently.  His download is below         REVIEW OF SYSTEMS: Out of a complete 14 system review of symptoms, the patient complains only of the following symptoms, and all other reviewed systems are negative.   ESS 15  ALLERGIES: Allergies  Allergen Reactions   Lisinopril Cough    HOME  MEDICATIONS: Outpatient Medications Prior to Visit  Medication Sig Dispense Refill   acetaminophen (TYLENOL) 500 MG tablet Take 500 mg by mouth every 6 (six) hours as needed for mild pain, headache or fever.     amLODipine (NORVASC) 10 MG tablet Take 1 tablet (10 mg total) by mouth daily. 90 tablet 3   aspirin EC 81 MG tablet Take by mouth.     docusate sodium (COLACE) 100 MG capsule Take 100 mg by mouth 2 (two) times daily as needed (constipation).     donepezil (ARICEPT) 10 MG tablet Take 1 tablet (10 mg total) by mouth daily. In the morning 30 tablet 11   lidocaine (LIDODERM) 5 % Place 1 patch onto the skin daily. Remove & Discard patch within 12 hours or as directed by MD 10 patch 0   losartan-hydrochlorothiazide (HYZAAR) 50-12.5 MG tablet Take 1 tablet by mouth daily.     metFORMIN (GLUCOPHAGE-XR) 500 MG 24 hr tablet Take 500 mg by mouth.     metoprolol tartrate (LOPRESSOR) 50 MG tablet TAKE 1 TABLET BY MOUTH TWICE DAILY (Patient taking differently: Take 50 mg by mouth 2 (two) times daily.) 180 tablet 3   potassium chloride (MICRO-K) 10 MEQ CR capsule Take 10 mEq by mouth in the morning.  5   Psyllium (METAMUCIL FIBER  PO) Take 1 Scoop by mouth 2 (two) times daily.      rosuvastatin (CRESTOR) 40 MG tablet TAKE 1 TABLET BY MOUTH ONCE DAILY 90 tablet 2   tamsulosin (FLOMAX) 0.4 MG CAPS capsule Take 0.4 mg by mouth in the morning.     clonazePAM (KLONOPIN) 1 MG tablet Take by mouth.     ipratropium-albuterol (DUONEB) 0.5-2.5 (3) MG/3ML SOLN Take 3 mLs by nebulization every 6 (six) hours as needed. 360 mL 1   meclizine (ANTIVERT) 25 MG tablet      No facility-administered medications prior to visit.    PAST MEDICAL HISTORY: Past Medical History:  Diagnosis Date   Anxiety    Colon cancer (HCC)    Colon polyps    Coronary artery disease    Dementia (HCC)    Dizziness and giddiness    GERD (gastroesophageal reflux disease)    Hypercholesteremia    Hyperglycemia    Hypertension    OSA  (obstructive sleep apnea)    CPAP    Pre-diabetes    Prediabetes    diet controlled    PAST SURGICAL HISTORY: Past Surgical History:  Procedure Laterality Date   ADRENALECTOMY Left 12/07/2020   Procedure: LEFT ADRENALECTOMY;  Surgeon: Jannifer Hick, MD;  Location: [facility] OR;  Service: Urology;  Laterality: Left;   BIOPSY  09/13/2020   Procedure: BIOPSY;  Surgeon: Meridee Score Netty Starring., MD;  Location: [facility] ENDOSCOPY;  Service: Gastroenterology;;   cardaic stents     CARDIAC CATHETERIZATION  10/28/2010   CHOLECYSTECTOMY N/A 12/07/2020   Procedure: CHOLECYSTECTOMY;  Surgeon: Fritzi Mandes, MD;  Location: [facility] OR;  Service: General;  Laterality: N/A;   COLONOSCOPY     ESOPHAGOGASTRODUODENOSCOPY (EGD) WITH PROPOFOL N/A 04/19/2020   Procedure: ESOPHAGOGASTRODUODENOSCOPY (EGD) WITH PROPOFOL;  Surgeon: Vida Rigger, MD;  Location: [facility] ENDOSCOPY;  Service: Endoscopy;  Laterality: N/A;   ESOPHAGOGASTRODUODENOSCOPY (EGD) WITH PROPOFOL N/A 09/13/2020   Procedure: ESOPHAGOGASTRODUODENOSCOPY (EGD) WITH PROPOFOL;  Surgeon: Meridee Score Netty Starring., MD;  Location: [facility] ENDOSCOPY;  Service: Gastroenterology;  Laterality: N/A;   ESOPHAGOGASTRODUODENOSCOPY (EGD) WITH PROPOFOL N/A 05/31/2021   Procedure: ESOPHAGOGASTRODUODENOSCOPY (EGD) WITH PROPOFOL;  Surgeon: Vida Rigger, MD;  Location: [facility] ENDOSCOPY;  Service: Endoscopy;  Laterality: N/A;  please have side viewing scope available   EUS  09/13/2020   Procedure: UPPER ENDOSCOPIC ULTRASOUND (EUS) RADIAL;  Surgeon: Lemar Lofty., MD;  Location: [facility] ENDOSCOPY;  Service: Gastroenterology;;   HOT HEMOSTASIS N/A 04/19/2020   Procedure: HOT HEMOSTASIS (ARGON PLASMA COAGULATION/BICAP);  Surgeon: Vida Rigger, MD;  Location: [facility] ENDOSCOPY;  Service: Endoscopy;  Laterality: N/A;   NEPHRECTOMY Left 12/07/2020   Procedure: OPEN LEFT RADICAL NEPHRECTOMY;  Surgeon: Jannifer Hick, MD;  Location: [facility] OR;  Service: Urology;  Laterality: Left;   PARTIAL COLECTOMY  1996   colon  cancer   POLYPECTOMY  05/31/2021   Procedure: POLYPECTOMY;  Surgeon: Vida Rigger, MD;  Location: [facility] ENDOSCOPY;  Service: Endoscopy;;   WHIPPLE PROCEDURE N/A 12/07/2020   Procedure: OPEN TRANSDUODENAL AMPULLECTOMY;  Surgeon: Fritzi Mandes, MD;  Location: [facility] OR;  Service: General;  Laterality: N/A;    FAMILY HISTORY: Family History  Problem Relation Age of Onset   Stroke Mother    Diabetes Mother    Hypertension Father    Hyperlipidemia Father    Seizures Father    Diabetes Father    Hypertension Sister    Hyperlipidemia Sister    Heart disease Paternal Grandfather    Diabetes Paternal Grandfather    GER  disease Daughter    Colon cancer Neg Hx    Esophageal cancer Neg Hx    Inflammatory bowel disease Neg Hx    Liver disease Neg Hx    Pancreatic cancer Neg Hx    Rectal cancer Neg Hx    Stomach cancer Neg Hx    Sleep apnea Neg Hx    Alzheimer's disease Neg Hx    Dementia Neg Hx     SOCIAL HISTORY: Social History   Socioeconomic History   Marital status: Married    Spouse name: [name]   Number of children: 3   Years of education: Not on file   Highest education level: 10th grade  Occupational History   Occupation: retired  Tobacco Use   Smoking status: Never   Smokeless tobacco: Never  Vaping Use   Vaping status: Never Used  Substance and Sexual Activity   Alcohol use: Yes    Comment: occasional   Drug use: Not Currently   Sexual activity: Not on file  Other Topics Concern   Not on file  Social History Narrative   Lives with wife    Right handed   Caffeine: 1 mug of coffee a day   Social Drivers of Corporate investment banker Strain: Not on file  Food Insecurity: Not on file  Transportation Needs: Not on file  Physical Activity: Not on file  Stress: Not on file  Social Connections: Not on file  Intimate Partner Violence: Not on file      PHYSICAL EXAM  Vitals:   03/11/24 0926  BP: 137/70  Pulse: (!) 58  Weight: (!) 314 lb 6.4 oz (142.6  kg)  Height: 5\' 11"  (1.803 m)   Body mass index is 43.85 kg/m.     03/11/2024    9:22 AM 07/16/2023   11:30 AM 07/13/2022    2:22 PM  MMSE - Mini Mental State Exam  Orientation to time 4 3 5   Orientation to Place 4 4 5   Registration 3 3 3   Attention/ Calculation 1 1 1   Recall 3 2 1   Language- name 2 objects 2 2 2   Language- repeat 1 1 1   Language- follow 3 step command 3 3 3   Language- read & follow direction 1 1 1   Write a sentence 1 1 1   Copy design 0 0 0  Total score 23 21 23      Generalized: Well developed, in no acute distress  Chest: Lungs clear to auscultation bilaterally  Neurological examination  Mentation: Alert oriented to time, place, history taking. Follows all commands speech and language fluent Cranial nerve II-XII: Facial symmetry noted Sensory: Sensory testing is intact to soft touch on all 4 extremities. No evidence of extinction is noted.  Gait and station: Gait is normal.    DIAGNOSTIC DATA (LABS, IMAGING, TESTING) - I reviewed patient records, labs, notes, testing and imaging myself where available.  Lab Results  Component Value Date   WBC 8.6 01/03/2024   HGB 14.3 01/03/2024   HCT 43.7 01/03/2024   MCV 93.8 01/03/2024   PLT 190 01/03/2024      Component Value Date/Time   NA 142 01/03/2024 0752   NA 142 07/06/2021 1349   K 3.6 01/03/2024 0752   CL 105 01/03/2024 0752   CO2 27 01/03/2024 0752   GLUCOSE 103 (H) 01/03/2024 0752   BUN 17 01/03/2024 0752   BUN 17 07/06/2021 1349   CREATININE 1.26 (H) 01/03/2024 0752   CALCIUM 9.3 01/03/2024 0752  PROT 6.1 (L) 01/03/2024 0752   ALBUMIN 3.6 01/03/2024 0752   AST 18 01/03/2024 0752   ALT 24 01/03/2024 0752   ALKPHOS 59 01/03/2024 0752   BILITOT 0.7 01/03/2024 0752   GFRNONAA 58 (L) 01/03/2024 0752   GFRAA >60 09/13/2020 1350    Lab Results  Component Value Date   HGBA1C 5.5 12/08/2020      ASSESSMENT AND PLAN 78 y.o. year old male  has a past medical history of Anxiety, Colon  cancer (HCC), Colon polyps, Coronary artery disease, Dementia (HCC), Dizziness and giddiness, GERD (gastroesophageal reflux disease), Hypercholesteremia, Hyperglycemia, Hypertension, OSA (obstructive sleep apnea), Pre-diabetes, and Prediabetes. here with:  OSA on CPAP Memory disturbance  - Noncompliant with CPAP therapy - Encourage patient to retry CPAP  - Continue Aricept to 10 mg daily (in the AM) - Keep appointment with Dr. Kieth Brightly for memory testing - Follow-up in 6 months with Dr. Vickey Huger or sooner if needed    Butch Penny, MSN, NP-C 03/11/2024, 9:32 AM [facility] [address] [place], [place] [address] [phone]

## 2024-03-11 NOTE — Patient Instructions (Signed)
 Restart CPAP therapy. Use nightly  Continue Aricept for memory  Keep appt in July with Dr. Kieth Brightly for memory testing

## 2024-05-26 ENCOUNTER — Other Ambulatory Visit: Payer: Self-pay | Admitting: Adult Health

## 2024-06-02 DIAGNOSIS — K219 Gastro-esophageal reflux disease without esophagitis: Secondary | ICD-10-CM | POA: Diagnosis not present

## 2024-06-02 DIAGNOSIS — N182 Chronic kidney disease, stage 2 (mild): Secondary | ICD-10-CM | POA: Diagnosis not present

## 2024-06-02 DIAGNOSIS — E1165 Type 2 diabetes mellitus with hyperglycemia: Secondary | ICD-10-CM | POA: Diagnosis not present

## 2024-06-02 DIAGNOSIS — E782 Mixed hyperlipidemia: Secondary | ICD-10-CM | POA: Diagnosis not present

## 2024-06-02 DIAGNOSIS — I2510 Atherosclerotic heart disease of native coronary artery without angina pectoris: Secondary | ICD-10-CM | POA: Diagnosis not present

## 2024-06-09 DIAGNOSIS — E782 Mixed hyperlipidemia: Secondary | ICD-10-CM | POA: Diagnosis not present

## 2024-06-09 DIAGNOSIS — M150 Primary generalized (osteo)arthritis: Secondary | ICD-10-CM | POA: Diagnosis not present

## 2024-06-09 DIAGNOSIS — I10 Essential (primary) hypertension: Secondary | ICD-10-CM | POA: Diagnosis not present

## 2024-06-09 DIAGNOSIS — I2510 Atherosclerotic heart disease of native coronary artery without angina pectoris: Secondary | ICD-10-CM | POA: Diagnosis not present

## 2024-06-09 DIAGNOSIS — K219 Gastro-esophageal reflux disease without esophagitis: Secondary | ICD-10-CM | POA: Diagnosis not present

## 2024-06-09 DIAGNOSIS — G4733 Obstructive sleep apnea (adult) (pediatric): Secondary | ICD-10-CM | POA: Diagnosis not present

## 2024-06-09 DIAGNOSIS — N182 Chronic kidney disease, stage 2 (mild): Secondary | ICD-10-CM | POA: Diagnosis not present

## 2024-06-09 DIAGNOSIS — E1165 Type 2 diabetes mellitus with hyperglycemia: Secondary | ICD-10-CM | POA: Diagnosis not present

## 2024-06-25 ENCOUNTER — Other Ambulatory Visit: Payer: Self-pay | Admitting: Cardiology

## 2024-06-25 DIAGNOSIS — I2510 Atherosclerotic heart disease of native coronary artery without angina pectoris: Secondary | ICD-10-CM

## 2024-06-25 DIAGNOSIS — E7800 Pure hypercholesterolemia, unspecified: Secondary | ICD-10-CM

## 2024-07-10 ENCOUNTER — Encounter: Payer: Medicare Other | Admitting: Psychology

## 2024-07-15 ENCOUNTER — Encounter: Attending: Psychology | Admitting: Psychology

## 2024-07-15 ENCOUNTER — Encounter: Payer: Self-pay | Admitting: Psychology

## 2024-07-15 DIAGNOSIS — R4189 Other symptoms and signs involving cognitive functions and awareness: Secondary | ICD-10-CM | POA: Diagnosis present

## 2024-07-15 NOTE — Progress Notes (Signed)
 NEUROPSYCHOLOGICAL EVALUATION [place]  Physical Medicine and Rehabilitation     Patient: Justin Davidson  MRN: [id number] DOB: [birth year]  Age: 78 y.o. Sex: male  Race/Ethnicity: Black or African American  Years of Education: 9 Handedness: Right  Collateral Information Source: Daughter [name], Spouse ([name])  Referring Provider:  Sherryl Bouchard, NP; Dohmeier, Dedra, MD  Provider/Clinical Neuropsychologist: Evalene DOROTHA Riff, PsyD  Date of Service: 07/15/2024 Start Time: 3 PM End Time: 5 PM  Location of Service:  [facility] Physical Medicine & Rehabilitation Department . [facility] [address] [place]. [address] [place], [place] [address] Phone: [phone]  Billing Code/Service:            96116/96121  Individuals Present: Patient was seen accompanied by his daughter and spouse with his permission and met, in-person, with the provider. 1 hour and 15 minutes spent in face-to-face clinical interview and remaining 45 minutes was spent in record review, documentation, and testing protocol construction.    PATIENT CONSENT AND CONFIDENTIALITY The patient's understanding of the reason for referral was intact. Discussed limits of confidentiality including, but not limited to, posting of final evaluation report in the patient's electronic medical record for both the patient and for the referring provider and appropriate medical professionals. Patient was given the opportunity to have their questions answered. The neuropsychological evaluation process was discussed with the patient and they consented to proceed with the evaluation.  Consent for Evaluation and Treatment: Signed: Yes Explanation of Privacy Policies: Signed: Yes Discussion of Confidentiality Limits: Yes  REASON FOR REFERRAL:The patient was referred for neuropsychological evaluation by his providers at neurology due to concerns for cognitive decline involving memory loss,  increased agitation, unusually vivid dreams, and family concerns for financial decision making. Per 07/16/2023 visit notes from the referring provider, patient has a history of OSA on CPAP although the patient was reported to have been noncompliant with CPAP use. Patient medical history, per records, also includes Anxiety, Colon cancer (partial colectomy 2021), Colon polyps, Coronary artery disease, Dementia, Dizziness and giddiness, GERD, Hypercholesteremia, Hyperglycemia, Hypertension, OSA, Pre-diabetes, and Prediabetes. Medical records show the patient has been on donepezil  since at least 2018. Date of dementia diagnosis could not be determined based on available electronic records, with first mention indicating a known dementia within a 2020 ED visit note. Documentation may have occurred prior to electronic record integration.   The patient was accompanied to the interview for the current evaluation by his wife and his daughter later joined as well, both with his permission.  The patient acknowledged noticing some changes, having a touch of dementia, maybe about five years ago. His daughter and spouse expressed relatively more concern for cognitive changes and for indications of decline over time. Family expressed some concerns for the patient's behavior and worried he may be taken advantage of financially by strangers. The patient denied having major concerns about this, and became frustrated when discussing the topic.   HISTORY OF PRESENTING CONCERNS: Per interview, Cognitive Symptom Onset & Course: Per records, indications of cognitive change likely date back to at least 2018 per notes of prescription of memory medication. Mention of dementia as a known condition was found in records from 2020. Family reported noticing gradual and progressive cognitive changes, and possibility of more noticeable difficulties in the last 3 or so years.   Current Cognitive Complaints:  Memory: Family and patient  reported relatively minor difficulties with remembering recent events and conversations. He recognizes that he misplaces things more  frequently.  His wife noted that he sometimes repeats himself without realizing it.  It is not clear whether or not attendance to medical appointments is based on forgetting or him not wanting to go.  Patient has been receiving some assistance with medications over the last couple years and will occasionally forget them.  Forgetfulness in daily activities is noted (leaving the water running). No difficulties with navigation while driving.  Processing Speed: Family and the patient noticed slowed processing. Attention & Concentration: The patient noticed changes in the attention and concentration.  Family reported noticing changes in attention and concentration as well. Language: Minor difficulties with word finding/communication at night.  No difficulties understanding others.  The patient reported that he feels mental arithmetic is slightly harder for him now. Visual-Spatial: Difficulties with visual-spatial abilities are unclear. Some concerns regarding driving, but it is unclear if these are visual-spatial versus behavioral. His daughter indicated his motor skills are fine with respect to driving as is his sense of direction, but attention and possible impulsivity are concerns.  Executive Functioning: Per family report, the patient is more irritable and impatient, and can escalate when things don't go his way. One example that was provided related to behavior within a DMV. Driving behavior and behavior in general was described as more aggressive. Changes in driving behavior could reflect impulsivity or inattention, based on descriptions. Pursuit of previously rewarding activities seems to have increased, and fixation on those activities is notable. Craves sweets. Fixation on finances/making money. Social behavior is complex. He has historically be gregarious, and this may  not be a change. Descriptions indicate some increased demanding behavior in the home, but at the same time he shows some sensitivity to others and per family he still cares about people. He is constantly thinking about other people's needs. Family have some concerns about judgment. They have some concerns about leaving him home for extended durations. Activity levels are reduced overall.   Motor/Sensory Complaints:   Sensory changes: No changes in sense of smell or taste.  Some possible hearing difficulties.  Likely needs glasses. Balance/coordination/vertigo difficulties: Some possible changes in balance.  Slightly more experiences of dizziness with rapid changes in posture.  No frequent falls. Other motor difficulties: No tremors.  No marked coordination difficulties.  Significant persistent psychomotor activity, primarily in hands, fidgeting, picking.    Emotional and Behavioral Functioning:  Depression: More easily discouraged.  Sometimes feeling down and blue.  Reduced activity levels.  Low energy.  No significant prior history of depression. Anxiety: Some rumination and concerns primarily about others. Other: Denied experiences of hallucinations.  No indications of mania.  No suicidal ideation or homicidal ideation.  Some increased irritability. Used to exercise more. Now primarily drives around. Generally decreased activity levels. No notable psychiatric history or treatment.  Sleep: Stopped using CPAP because of belief that was leading to vivid dreaming. Vivid dreaming has persisted despite cessation of CPAP use. Some talking during sleep that has been a change for him. No additional acting out of dreams. Variable sleep durations. Denies problems with onset or maintenance.  Appetite: Increased Caffeine: ~ cup per day.  Alcohol Use: a little, suspected by family to be related to boredom. This is reportedly a change.  Tobacco Use: None Recreational Substance Use: None   Level of  Functional Independence: The patient is reportedly intact with basic activities of daily living.  Finances: Assisted   Shopping / Meal Preparation: Tends not to do on own.  Household Maintenance / Chores: Tends not  do to on own.    Tracking Appointments / Future Obligations: Assisted. Unclear if motivation is factor.  Medication Management: Assisted. Minor difficulties.    Driving: Drives. Some concerns for safety.     Medical History/Record Review: Per records and patient report, History of traumatic brain injury/concussion: Denied History of stroke: Denied History of heart attack: Denied History of cancer/chemotherapy: Partial colectomy and chemotherapy in 1996. History of seizure activity: Denied. Symptoms of chronic pain: Some lower back pain but denied significant difficulties. Experience of frequent headaches/migraines: None.  Past Medical History:  Diagnosis Date   Anxiety    Colon cancer (HCC)    Colon polyps    Coronary artery disease    Dementia (HCC)    Dizziness and giddiness    GERD (gastroesophageal reflux disease)    Hypercholesteremia    Hyperglycemia    Hypertension    OSA (obstructive sleep apnea)    CPAP    Pre-diabetes    Prediabetes    diet controlled   Patient Active Problem List   Diagnosis Date Noted   Multifocal pneumonia 10/30/2022   PNA (pneumonia) 10/30/2022   Impairment of cognitive function 07/13/2022   Severe obstructive sleep apnea-hypopnea syndrome 07/13/2022   Chronic intermittent hypoxia with obstructive sleep apnea 07/13/2022   History of kidney cancer excluding renal pelvis 07/13/2022   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   Abnormal findings on esophagogastroduodenoscopy (EGD) 07/19/2020   Antiplatelet or antithrombotic long-term use 07/19/2020   Pharyngoesophageal dysphagia 08/12/2019   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 No susceptibility artifact to suggest hemorrhage. The ventricles and sulci are normal for patient's age. Patchy supratentorial white matter FLAIR T2 hyperintensities compatible with mild chronic small vessel ischemic disease, normal for age. No suspicious parenchymal signal, mass or mass effect. No abnormal extra-axial fluid collections. IMPRESSION: Negative noncontrast MRI of the head for age.  Family Neurologic/Medical Hx:  Family History  Problem Relation Age of Onset   Stroke Mother    Diabetes Mother    Hypertension Father    Hyperlipidemia Father    Seizures Father    Diabetes Father    Hypertension Sister    Hyperlipidemia Sister    Heart disease Paternal Grandfather    Diabetes Paternal Grandfather    GER disease Daughter    Colon cancer Neg Hx    Esophageal cancer Neg Hx    Inflammatory bowel disease Neg Hx    Liver disease Neg Hx    Pancreatic cancer Neg Hx    Rectal cancer Neg Hx    Stomach cancer Neg Hx    Sleep apnea Neg Hx    Alzheimer's disease Neg Hx    Dementia Neg Hx    Medications:  acetaminophen  (TYLENOL ) 500 MG tablet amLODipine  (NORVASC ) 10 MG tablet aspirin  EC 81 MG tablet docusate sodium  (COLACE) 100 MG capsule donepezil  (ARICEPT ) 10 MG tablet  lidocaine  (LIDODERM ) 5 % losartan -hydrochlorothiazide  (HYZAAR) 50-12.5 MG tablet metFORMIN (GLUCOPHAGE-XR) 500 MG 24 hr tablet metoprolol  tartrate (LOPRESSOR ) 50 MG tablet potassium chloride  (MICRO-K ) 10 MEQ CR capsule Psyllium (METAMUCIL FIBER PO) rosuvastatin  (CRESTOR ) 40 MG tablet tamsulosin  (FLOMAX )  0.4 MG CAPS capsu  Academic/Vocational History: 9 years of education  completed. The patient indicated he may have been held back one grade, but offered that he may have been started a year early. He reported some slight reading difficulty, but no major learning difficulties. [place]. 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   Psychosocial: Marital Status: Married 60 years. Children/Grandchildren: 3 adult children and one grandchild.  Living Situation: Lives alone with wife.  Daily Activities/Hobbies: Limited.   Mental Status/Behavioral Observations 07/15/2024: The patient was seen on an outpatient basis in the [facility] PM&R office for the clinical interview accompanied by his spouse and later by his daughter. Sensorium/Arousal: The patient was alert.  Hearing and vision were adequate for the purpose of the interview although some slight hearing difficulty may be present. Orientation: Patient was oriented to the purpose of the evaluation. Appearance: Dress and hygiene were appropriate for the setting. Behavior: Some indications of difficulties with attention and concentration were seen behaviorally.  Tangentiality was notable, as was irritability.  Speech/Language:Conversational speech was prosodic, fluent, and (mechanically) articulate. Difficulties with receptive language were unclear due to attention difficulties, but may be present.  Motor: Ambulated independently. Atypical / notable fidgeting with upper limbs/hands.  Social Comportment: Not markedly inappropriate or disinhibited, but easily irritable.  Mood: Irritable.  Affect: Congruent  Thought Process/Content: No evidence of psychosis, but highly tangential.  Ability to Participate in Interview: Variable. Some difficulties reliably answering the posed question, but variably so.  Insight: Mixed.    SUMMARY / CLINICAL IMPRESSIONS The patient was referred for neuropsychological evaluation by his providers at neurology due to concerns for cognitive decline  involving memory loss, increased agitation, unusually vivid dreams, and family concerns for financial decision making. Per 07/16/2023 visit notes from the referring provider, patient has a history of OSA on CPAP although the patient was reported to have been noncompliant with CPAP use. Patient medical history, per records, also includes Anxiety, Colon cancer (partial colectomy 2021), Colon polyps, Coronary artery disease, Dementia, Dizziness and giddiness, GERD, Hypercholesteremia, Hyperglycemia, Hypertension, OSA, Pre-diabetes, and Prediabetes. Medical records show the patient has been on donepezil  since at least 2018. Date of dementia diagnosis could not be determined based on available electronic records, with first mention indicating a known dementia within a 2020 ED visit note. Documentation may have occurred prior to electronic record integration.   The patient was accompanied to the interview for the current evaluation by his wife and his daughter later joined as well, both with his permission.  The patient acknowledged noticing some changes, having a touch of dementia, maybe about five years ago. His daughter and spouse expressed relatively more concern for cognitive changes and for indications of decline over time. Family expressed some concerns for the patient's behavior and worried he may be taken advantage of financially by strangers. The patient denied having major concerns about this, and became frustrated when discussing the topic. Patient and family report of cognitive changes involve minor changes in memory, slowed processing, significant changes in attention/concentration, and significant changes in behavior and indications of executive related deficits. Notable motor changes involve fidgeting with hands that was persistent and beyond the type of motor behavior secondary to mood/anxiety. Vivid dreaming is noted. The patient is able to engage in many activities, but is supported in instrumental  activities of daily living. He has stopped wearing his CPAP, although rationale is unclear. 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 The patient's presentation and clinical history are concerning for a neurological/neurodegenerative disease process. Formal cognitive testing will be beneficial in assessing level of cognitive functioning and aid in differential diagnosis/treatment planning.   DISPOSITION / PLAN  The patient has been set up for a formal neuropsychological assessment to objectively assess her cognitive functioning across domains to establish the patient's cognitive profile. This data, in conjunction with information obtained via clinical interview and medical record review, will help clarify likely etiology and guide treatment recommendations. Once data collection and interpretation have been completed, the findings / diagnosis and recommendations will be reviewed and discussed with the patient during a feedback appointment with the neuropsychologist. Based on the collaborative dialogue with the patient during the feedback, recommendations may be adjusted / tailored as needed. A formal report will be produced and provided to the patient and the referring provider.   Diagnosis: Cognitive changes.     This report was generated using voice recognition software. While this document has been carefully reviewed, transcription errors may be present. I apologize in advance for any inconvenience. Please contact me if further clarification is needed.             Evalene DOROTHA Riff, PsyD             Neuropsychologist

## 2024-07-22 ENCOUNTER — Ambulatory Visit: Payer: Medicare Other | Admitting: Cardiology

## 2024-08-18 ENCOUNTER — Encounter: Attending: Psychology

## 2024-08-18 DIAGNOSIS — F0390 Unspecified dementia without behavioral disturbance: Secondary | ICD-10-CM | POA: Insufficient documentation

## 2024-08-18 DIAGNOSIS — R4189 Other symptoms and signs involving cognitive functions and awareness: Secondary | ICD-10-CM | POA: Diagnosis present

## 2024-08-18 NOTE — Progress Notes (Signed)
 Mental Status/Behavioral Observations (08/18/2024):  Orientation: The patient was fully oriented to self, place, and time. Sensory/Arousal:  Vision was adequate for testing. Speech volume was increased throughout the assessment to accommodate difficulties with hearing. The patient was alert. Appearance: Dress and hygiene were appropriate for the setting.   Speech/Language: In conversation, the patient's speech was comprehensible, but highly tangential. The patient displayed no indications of word finding difficulties and no paraphasic errors were observed.   Motor: The patient ambulated independently and without issue. Fidgeting, smacking lip-like behavior.   Social Comportment: Social behavior was largely appropriate to the setting. Mood/Affect: Mood was largely neutral. Affect was consistent with mood.  Attention/Concentration: The patient had moderate difficulties with staying on task, and occasionally went on lengthy tangents unrelated to the test itself. Attention was not easily redirectible. Thought Process/Content: The patient's thought process was tangential, and perseverative at times. There were no indications of hallucination/paranoia or clear psychosis.  Ability to Participate in Testing / Additional Observations: The patient had significant difficulties with understanding and following task instructions, resulting in adjustments to the test battery. He required frequent repetition of instructions despite adjusted speech volume. The patient reported some feelings of frustration surrounding having to complete testing. The patient appeared restless at times and would frequently fidget with his hands or make smacking noises with his mouth. Neuropsychology Note Yussuf Sawyers completed 135 minutes of neuropsychological testing with technician, Josue Ned, BA, under the supervision of Evalene Riff, PsyD., Clinical Neuropsychologist. The patient did not appear overtly distressed by the  testing session, per behavioral observation or via self-report to the technician. Rest breaks were offered.   Clinical Decision Making: In considering the patient's current level of functioning, level of presumed impairment, nature of symptoms, emotional and behavioral responses during clinical interview, level of literacy, and observed level of motivation/effort, a battery of tests was selected by Dr. Riff during initial consultation on 07/15/2024. This was communicated to the technician. Communication between the neuropsychologist and technician was ongoing throughout the testing session and changes were made as deemed necessary based on patient performance on testing, technician observations and additional pertinent factors such as those listed above.  Tests Administered: Brief Diagnostic Aphasia Examination, Third Edition (BDAE); select subtest Brief Praxis Assessment Clock Drawing Test Grooved Pegboard Test Hopkins Verbal Learning Test - Revised (HVLT-R) Repeatable Battery for the Assessment of Neuropsychological Status Update (RBANS); Form A Sequential Commands Trail Making Test (TMT; Part A & B) Wechsler Adult Intelligence Scale-Fourth Edition (WAIS-IV), select subtest Wechsler Test of Adult Reading (WTAR) Geriatric Depression Scale-Short Form (GDS-SF) Geriatric Anxiety Inventory (GAI)   Results: Note: This summary of test scores accompanies the interpretive report and should not be interpreted by unqualified individuals or in isolation without reference to the report. Test scores are relative to age, gender, and educational history as available and appropriate. Measurement properties of test scores: IQ, Index, and Standard Scores (SS): Mean = 100; Standard Deviation = 15; Scaled Scores (ss): Mean = 10; Standard Deviation = 3; Z scores (Z): Mean = 0; Standard Deviation = 1; T scores (T); Mean = 50; Standard Deviation = 10   Intellectual/Premorbid Functioning Estimate   Norm Score  Percentile  Range  Wechsler Test of Adult Reading  SS = 74 4 %ile Below Average   ATTENTION AND WORKING MEMORY    Norm Score Percentile  Range  WAIS-IV          Digit Span  ss =   %ile    DSF  ss =  8 25 %ile Average   Span:    6      PROCESSING SPEED    Norm Score Percentile  Range  RBANS          Coding  ss = 1 0.1 %ile Exceptionally Low   PSYCHOMOTION    Norm Score Percentile  Range  Grooved Pegboard - Dominant  t = 33 5 %ile Below Average   # of drops    0     Grooved Pegboard - Nondominant  t = 32 4 %ile Below Average   # of drops    0      LANGUAGE    Norm Score Percentile  Range  RBANS           Picture Naming     3rd-9th  %ile Low to Below Average    Semantic Fluency  ss = 3 1 %ile Exceptionally Low   EXECUTIVE FUNCTIONING    Norm Score Percentile  Range  Trails A  t =   %ile Discontinued (time limit reached)  Trails B  t =   %ile Discontinued (time limit reached)   MEMORY    Norm Score Percentile  Range  HVLT          Total Recall  t = <20 <0.1 %ile Exceptionally Low    Delayed Recall  t = 23 0.3 %ile Exceptionally Low   %Retention  t = 21.0 0.1 %ile Exceptionally Low   Recognition Discriminability  t = <20 <0.1 %ile Exceptionally Low  RBANS          Story Memory  ss = 3 1 %ile Exceptionally Low   Story Recall  ss = 3 1 %ile Exceptionally Low   Figure Recall  ss = 4 2 %ile Below Average   VISUAL-SPATIAL    Norm Score Percentile  Range  Clock                   RBANS Visuospatial Index          RBANS Figure Copy  ss = 9 37 %ile Average   RBANS Line Orientation      3rd-9th   %ile Low to Below Average   PERSONALITY AND BEHAVIORAL FUNCTIONING      Score/Interpretation  GDS-SF Raw       4  GDS-SF Severity       Minimal.  GAI Raw       1  GAI Severity       Minimal.    Feedback to Patient: Nikia Mangino will return on 08/28/2024 for an interactive feedback session with Dr. Hayden at which time his test performances, clinical impressions and treatment  recommendations will be reviewed in detail. The patient understands he can contact our office should he require our assistance before this time.  135 minutes spent face-to-face with patient administering standardized tests, 30 minutes spent scoring Radiographer, therapeutic). [CPT [id number], [id number]]  Full report to follow.

## 2024-08-21 ENCOUNTER — Encounter: Admitting: Psychology

## 2024-08-21 DIAGNOSIS — F0390 Unspecified dementia without behavioral disturbance: Secondary | ICD-10-CM

## 2024-08-21 DIAGNOSIS — R4189 Other symptoms and signs involving cognitive functions and awareness: Secondary | ICD-10-CM | POA: Diagnosis not present

## 2024-08-28 ENCOUNTER — Encounter: Admitting: Psychology

## 2024-08-29 ENCOUNTER — Encounter: Admitting: Psychology

## 2024-08-29 DIAGNOSIS — F0390 Unspecified dementia without behavioral disturbance: Secondary | ICD-10-CM

## 2024-08-29 DIAGNOSIS — R4189 Other symptoms and signs involving cognitive functions and awareness: Secondary | ICD-10-CM | POA: Diagnosis not present

## 2024-09-14 NOTE — Progress Notes (Signed)
 NEUROPSYCHOLOGICAL EVALUATION [place]. [facility]  Physical Medicine and Rehabilitation     Patient: Justin Davidson  MRN: [id number] DOB: 08/22/1946  Age: 78 y.o. Sex: male  Race/Ethnicity: Black or African American  Years of Education: 9 Handedness: Right  Collateral Information Source: Daughter [name], Spouse ([name])  Referring Provider:  Sherryl Bouchard, NP; Dohmeier, Dedra, MD  Provider/Clinical Neuropsychologist: Evalene DOROTHA Riff, PsyD  Date of Service: 08/21/24 Start Time: 9 AM End Time: 10 AM  Location of Service:  [facility] Physical Medicine & Rehabilitation Department [place]. [facility] [address], [place]. [address] [place], [place] [address] Phone: [phone]  Billing Code/Service: [phone]  Individuals Present: Evalene Riff, PsyD 1 hour was spent on interpretation of patient data, interpretation of standardized test results and clinical data, clinical decision making, initial treatment planning/recommendations, and report writing. The report will be amended as needed based on any additional information collected during interactive feedback session.   REASON FOR REFERRAL:The patient was referred for neuropsychological evaluation by his providers at neurology due to concerns for cognitive decline involving memory loss, increased agitation, unusually vivid dreams, and family concerns for financial decision making. Per 07/16/2023 visit notes from the referring provider, patient has a history of OSA on CPAP although the patient was reported to have been noncompliant with CPAP use. Patient medical history, per records, also includes Anxiety, Colon cancer (partial colectomy 2021), Colon polyps, Coronary artery disease, Dementia, Dizziness and giddiness, GERD, Hypercholesteremia, Hyperglycemia, Hypertension, OSA, Pre-diabetes, and Prediabetes. Medical records show the patient has been on donepezil  since at least 2018. Date of dementia diagnosis  could not be determined based on available electronic records, with first mention indicating a known dementia within a 2020 ED visit note. Documentation may have occurred prior to electronic record integration.   The patient was accompanied to the interview for the current evaluation by his wife and his daughter later joined as well, both with his permission.  The patient acknowledged noticing some changes, having a touch of dementia, maybe about five years ago. His daughter and spouse expressed relatively more concern for cognitive changes and for indications of decline over time. Family expressed some concerns for the patient's behavior and worried he may be taken advantage of financially by strangers. The patient denied having major concerns about this, and became frustrated when discussing the topic.   HISTORY OF PRESENTING CONCERNS: Per interview, Cognitive Symptom Onset & Course: Per records, indications of cognitive change likely date back to at least 2018 per notes of prescription of memory medication. Mention of dementia as a known condition was found in records from 2020. Family reported noticing gradual and progressive cognitive changes, and possibility of more noticeable difficulties in the last 3 or so years.   Current Cognitive Complaints:  Memory: Family and patient reported relatively minor difficulties with remembering recent events and conversations. He recognizes that he misplaces things more frequently.  His wife noted that he sometimes repeats himself without realizing it.  It is not clear whether or not attendance to medical appointments is based on forgetting or him not wanting to go.  Patient has been receiving some assistance with medications over the last couple years and will occasionally forget them.  Forgetfulness in daily activities is noted (leaving the water running). No difficulties with navigation while driving.  Processing Speed: Family and the patient noticed  slowed processing. Attention & Concentration: The patient noticed changes in the attention and concentration.  Family reported noticing changes in attention and concentration  as well. Language: Minor difficulties with word finding/communication at night.  No difficulties understanding others.  The patient reported that he feels mental arithmetic is slightly harder for him now. Visual-Spatial: Difficulties with visual-spatial abilities are unclear. Some concerns regarding driving, but it is unclear if these are visual-spatial versus behavioral. His daughter indicated his motor skills are fine with respect to driving as is his sense of direction, but attention and possible impulsivity are concerns.  Executive Functioning: Per family report, the patient is more irritable and impatient, and can escalate when things don't go his way. One example that was provided related to behavior within a DMV. Driving behavior and behavior in general was described as more aggressive. Changes in driving behavior could reflect impulsivity or inattention, based on descriptions. Pursuit of previously rewarding activities seems to have increased, and fixation on those activities is notable. Craves sweets. Fixation on finances/making money. Social behavior is complex. He has historically be gregarious, and this may not be a change. Descriptions indicate some increased demanding behavior in the home, but at the same time he shows some sensitivity to others and per family he still cares about people. He is constantly thinking about other people's needs. Family have some concerns about judgment. They have some concerns about leaving him home for extended durations. Activity levels are reduced overall.   Motor/Sensory Complaints:   Sensory changes: No changes in sense of smell or taste.  Some possible hearing difficulties.  Likely needs glasses. Balance/coordination/vertigo difficulties: Some possible changes in balance.  Slightly  more experiences of dizziness with rapid changes in posture.  No frequent falls. Other motor difficulties: No tremors.  No marked coordination difficulties.  Significant persistent psychomotor activity, primarily in hands, fidgeting, picking.    Emotional and Behavioral Functioning:  Depression: More easily discouraged.  Sometimes feeling down and blue.  Reduced activity levels.  Low energy.  No significant prior history of depression. Anxiety: Some rumination and concerns primarily about others. Other: Denied experiences of hallucinations.  No indications of mania.  No suicidal ideation or homicidal ideation.  Some increased irritability. Used to exercise more. Now primarily drives around. Generally decreased activity levels. No notable psychiatric history or treatment.  Sleep: Stopped using CPAP because of belief that was leading to vivid dreaming. Vivid dreaming has persisted despite cessation of CPAP use. Some talking during sleep that has been a change for him. No additional acting out of dreams. Variable sleep durations. Denies problems with onset or maintenance.  Appetite: Increased Caffeine: ~ cup per day.  Alcohol Use: a little, suspected by family to be related to boredom. This is reportedly a change.  Tobacco Use: None Recreational Substance Use: None   Level of Functional Independence: The patient is reportedly intact with basic activities of daily living.  Finances: Assisted   Shopping / Meal Preparation: Tends not to do on own.  Household Maintenance / Chores: Tends not do to on own.    Tracking Appointments / Future Obligations: Assisted. Unclear if motivation is factor.  Medication Management: Assisted. Minor difficulties.    Driving: Drives. Some concerns for safety.     Medical History/Record Review: Per records and patient report, History of traumatic brain injury/concussion: Denied History of stroke: Denied History of heart attack: Denied History of  cancer/chemotherapy: Partial colectomy and chemotherapy in 1996. History of seizure activity: Denied. Symptoms of chronic pain: Some lower back pain but denied significant difficulties. Experience of frequent headaches/migraines: None.  Past Medical History:  Diagnosis Date   Anxiety  Colon cancer (HCC)    Colon polyps    Coronary artery disease    Dementia (HCC)    Dizziness and giddiness    GERD (gastroesophageal reflux disease)    Hypercholesteremia    Hyperglycemia    Hypertension    OSA (obstructive sleep apnea)    CPAP    Pre-diabetes    Prediabetes    diet controlled   Patient Active Problem List   Diagnosis Date Noted   Multifocal pneumonia 10/30/2022   PNA (pneumonia) 10/30/2022   Impairment of cognitive function 07/13/2022   Severe obstructive sleep apnea-hypopnea syndrome 07/13/2022   Chronic intermittent hypoxia with obstructive sleep apnea 07/13/2022   History of kidney cancer excluding renal pelvis 07/13/2022   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   Abnormal findings on esophagogastroduodenoscopy (EGD) 07/19/2020   Antiplatelet or antithrombotic long-term use 07/19/2020   Pharyngoesophageal dysphagia 08/12/2019   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 No susceptibility artifact to suggest hemorrhage. The ventricles and sulci are normal for patient's age. Patchy supratentorial white matter FLAIR T2 hyperintensities compatible with mild chronic small vessel ischemic disease, normal for age. No suspicious parenchymal signal, mass or mass  effect. No abnormal extra-axial fluid collections. IMPRESSION: Negative noncontrast MRI of the head for age.  Family Neurologic/Medical Hx:  Family History  Problem Relation Age of Onset   Stroke Mother    Diabetes Mother    Hypertension Father    Hyperlipidemia Father    Seizures Father    Diabetes Father    Hypertension Sister    Hyperlipidemia Sister    Heart disease Paternal Grandfather    Diabetes Paternal Grandfather    GER disease Daughter    Colon cancer Neg Hx    Esophageal cancer Neg Hx    Inflammatory bowel disease Neg Hx    Liver disease Neg Hx    Pancreatic cancer Neg Hx    Rectal cancer Neg Hx    Stomach cancer Neg Hx    Sleep apnea Neg Hx    Alzheimer's disease Neg Hx    Dementia Neg Hx    Medications:  acetaminophen  (TYLENOL ) 500 MG tablet amLODipine  (NORVASC ) 10 MG tablet aspirin  EC 81 MG tablet docusate sodium  (COLACE) 100 MG capsule donepezil  (ARICEPT ) 10 MG tablet  lidocaine  (LIDODERM ) 5 % losartan -hydrochlorothiazide  (HYZAAR) 50-12.5 MG tablet metFORMIN (GLUCOPHAGE-XR) 500 MG 24 hr tablet metoprolol  tartrate (LOPRESSOR ) 50 MG tablet potassium chloride  (MICRO-K ) 10 MEQ CR capsule Psyllium (METAMUCIL FIBER PO) rosuvastatin  (CRESTOR ) 40 MG tablet tamsulosin  (FLOMAX ) 0.4 MG CAPS capsu  Academic/Vocational History: 9 years of education completed. The patient indicated he may have been held back one grade, but offered that he may have been started a year early. He reported some slight reading difficulty, but no major learning difficulties. [place]. 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   Psychosocial: Marital Status: Married 60 years. Children/Grandchildren: 3 adult children and one grandchild.  Living Situation: Lives alone with wife.  Daily Activities/Hobbies: Limited.   Mental Status/Behavioral Observations 07/15/2024: The patient was seen on an  outpatient basis in the [facility] PM&R office for  the clinical interview accompanied by his spouse and later by his daughter. Sensorium/Arousal: The patient was alert.  Hearing and vision were adequate for the purpose of the interview although some slight hearing difficulty may be present. Orientation: Patient was oriented to the purpose of the evaluation. Appearance: Dress and hygiene were appropriate for the setting. Behavior: Some indications of difficulties with attention and concentration were seen behaviorally.  Tangentiality was notable, as was irritability.  Speech/Language:Conversational speech was prosodic, fluent, and (mechanically) articulate. Difficulties with receptive language were unclear due to attention difficulties, but may be present.  Motor: Ambulated independently. Atypical / notable fidgeting with upper limbs/hands.  Social Comportment: Not markedly inappropriate or disinhibited, but easily irritable.  Mood: Irritable.  Affect: Congruent  Thought Process/Content: No evidence of psychosis, but highly tangential.  Ability to Participate in Interview: Variable. Some difficulties reliably answering the posed question, but variably so.  Insight: Mixed.    NEUROPSYCHODIAGNOSTIC FINDINGS:  Mental Status/Behavioral Observations (08/18/2024):  Orientation: The patient was fully oriented to self, place, and time. Sensory/Arousal:  Vision was adequate for testing. Speech volume was increased throughout the assessment to accommodate difficulties with hearing. The patient was alert. Appearance: Dress and hygiene were appropriate for the setting.   Speech/Language: In conversation, the patient's speech was comprehensible, but highly tangential. The patient displayed no indications of word finding difficulties and no paraphasic errors were observed.   Motor: The patient ambulated independently and without issue. Fidgeting, smacking lip-like behavior.   Social Comportment: Social behavior was largely appropriate to the  setting. Mood/Affect: Mood was largely neutral. Affect was consistent with mood.  Attention/Concentration: The patient had moderate difficulties with staying on task, and occasionally went on lengthy tangents unrelated to the test itself. Attention was not easily redirectible. Thought Process/Content: The patient's thought process was tangential, and perseverative at times. There were no indications of hallucination/paranoia or clear psychosis.  Ability to Participate in Testing / Additional Observations: The patient had significant difficulties with understanding and following task instructions, resulting in adjustments to the test battery. He required frequent repetition of instructions despite adjusted speech volume. The patient reported some feelings of frustration surrounding having to complete testing. The patient appeared restless at times and would frequently fidget with his hands or make smacking noises with his mouth. Tests Administered: Brief Diagnostic Aphasia Examination, Third Edition (BDAE); select subtest Brief Praxis Assessment Clock Drawing Test Grooved Pegboard Test Hopkins Verbal Learning Test - Revised (HVLT-R) Repeatable Battery for the Assessment of Neuropsychological Status Update (RBANS); Form A Sequential Commands Trail Making Test (TMT; Part A & B) Wechsler Adult Intelligence Scale-Fourth Edition (WAIS-IV), select subtest Wechsler Test of Adult Reading (WTAR) Geriatric Depression Scale-Short Form (GDS-SF) Geriatric Anxiety Inventory (GAI)  Results: Test scores are relative to age, gender, and educational history as available and appropriate. Measurement properties of test scores: IQ, Index, and Standard Scores (SS): Mean = 100; Standard Deviation = 15; Scaled Scores (ss): Mean = 10; Standard Deviation = 3; Z scores (Z): Mean = 0; Standard Deviation = 1; T scores (T); Mean = 50; Standard Deviation = 10   Intellectual/Premorbid Functioning Estimate   Norm Score  Percentile  Range  Wechsler Test of Adult Reading  SS = 74 4 %ile Below Average   ATTENTION AND WORKING MEMORY    Norm Score Percentile  Range  WAIS-IV          Digit Span  DSF  ss = 8 25 %ile Average   Span:    6      DSB       (Instruction comp. difficulty)   DSS       (Instruction comp. difficulty)   PROCESSING SPEED    Norm Score Percentile  Range  RBANS          Coding  ss = 1 0.1 %ile Exceptionally Low   PSYCHOMOTION    Norm Score Percentile  Range  Grooved Pegboard - Dominant  t = 33 5 %ile Below Average   # of drops    0     Grooved Pegboard - Nondominant  t = 32 4 %ile Below Average   # of drops    0      LANGUAGE    Norm Score Percentile  Range  RBANS           Picture Naming     3rd-9th  %ile Low to Below Average    Semantic Fluency  ss = 3 1 %ile Exceptionally Low  Sequential Commands - Informal comprehension of basic commands - Patient was able to follow two and three step commands, but difficulty with four step commands.  BDAE - Sentence Repetition - Score 6 of 10. Impaired. Errors did not vary based on sentence length or complexity.   Motor Praxis - Score 4 of 12. Impaired.  EXECUTIVE FUNCTIONING    Norm Score Percentile  Range  Trails A       Discontinued (time limit reached)  Trails B         Discontinued (time limit reached)   MEMORY    Norm Score Percentile  Range  HVLT          Total Recall  t = <20 <0.1 %ile Exceptionally Low    Delayed Recall  t = 23 0.3 %ile Exceptionally Low   %Retention  t = 21.0 0.1 %ile Exceptionally Low   Recognition Discriminability  t = <20 <0.1 %ile Exceptionally Low  RBANS          Story Memory  ss = 3 1 %ile Exceptionally Low   Story Recall  ss = 3 1 %ile Exceptionally Low   Figure Recall  ss = 4 2 %ile Below Average   VISUAL-SPATIAL    Norm Score Percentile  Range  Clock       (Incorrect time)  RBANS Visuospatial Index          RBANS Figure Copy  ss = 9 37 %ile Average   RBANS Line Orientation      3rd-9th    %ile Low to Below Average   PERSONALITY AND BEHAVIORAL FUNCTIONING      Score/Interpretation  GDS-SF Raw       4  GDS-SF Severity       Minimal.  GAI Raw       1  GAI Severity       Minimal.    SUMMARY / CLINICAL IMPRESSIONS The patient was referred for neuropsychological evaluation by his providers at neurology due to concerns for cognitive decline involving memory loss, increased agitation, unusually vivid dreams, and family concerns for financial decision making. Per 07/16/2023 visit notes from the referring provider, patient has a history of OSA on CPAP although the patient was reported to have been noncompliant with CPAP use. Patient medical history, per records, also includes Anxiety, Colon cancer (partial colectomy 2021), Colon polyps, Coronary artery disease, Dementia, Dizziness and giddiness, GERD, Hypercholesteremia,  Hyperglycemia, Hypertension, OSA, Pre-diabetes, and Prediabetes. Medical records show the patient has been on donepezil  since at least 2018. Date of dementia diagnosis could not be determined based on available electronic records, with first mention indicating a known dementia within a 2020 ED visit note. Documentation may have occurred prior to electronic record integration.   The patient was accompanied to the interview for the current evaluation by his wife and his daughter later joined as well, both with his permission.  The patient acknowledged noticing some changes, having a touch of dementia, maybe about five years ago. His daughter and spouse expressed relatively more concern for cognitive changes and for indications of decline over time. Family expressed some concerns for the patient's behavior and worried he may be taken advantage of financially by strangers. The patient denied having major concerns about this, and became frustrated when discussing the topic. Patient and family report of cognitive changes involve minor changes in memory, slowed processing, significant  changes in attention/concentration, and significant changes in behavior and indications of executive related deficits. Notable motor changes involve fidgeting with hands that was persistent and beyond the type of motor behavior secondary to mood/anxiety. Vivid dreaming is noted. The patient is able to engage in many activities, but is supported in instrumental activities of daily living. He has stopped wearing his CPAP, although rationale is unclear. 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 The patient's presentation and clinical history are concerning for a neurological/neurodegenerative disease process.   The patient's performance on cognitive testing showed indications of broad-based cognitive impairment with scores well below the norm by age and below estimated premorbid abilities derived from the patient's vocational history. The patient had notable difficulty following task instructions that could not be accounted for by hearing difficulty. He also showed difficulties with behavioral inhibition, perseveration, and distractibility. Sentence repetition, motor praxis, and paraphasic errors were noted. Social comportment itself was not markedly abnormal aside from difficulties with adherence to instructions and limited responses to attempts at redirection. Motor activity was atypical with fidgeting with hands and lip-smacking behavior that appeared outside of the patient's awareness. Tests in which the patient was unable to demonstrate adequate understanding of instructions, or unable to adhere to instructions reliably, were discontinued. The administration / engagement difficulties and behavior themselves likely reflect problems with language comprehension and executive dysfunction. On measures the patient was able to engage with reliably, he showed significant deficits in processing speed, sequencing, and memory. His visual-perception performances were  variable, doing well with visual construction and showing milder deficits in basic perception. His basic span of auditory-verbal attention was technically average, but at the lower limit of the score range. Speeded motor dexterity was score in the below average range. The patient's memory performances showed impairments across metrics, but some signs of maintenance of memory over time.   Results of the evaluation are concerning for a evidence of cognitive decline and a course suspicious for neurodegenerative pathology. Based on significant indications of impairment on testing, behavioral observations, symptom reports, and functional changes, a diagnosis of Major Neurocognitive Disorder is appropriate. I am concerned changes are likely progressive. Etiology is not entirely clear, but based on available data from this evaluation and from medical records, there is concern for behavioral variant of Frontotemporal dementia and / or cerebrovascular dementia. The patient's cognitive profile shows slight indications of memory preservation over time, and family reports of memory difficulties are consistent with this, which is more consistent with FTD /  cerebrovascular etiology than Alzheimer's pathology. Additional imaging may be beneficial in differential diagnosis if desired. At this point, difficulties are suspicious for FTD or cerebrovascular disease, or combination of the two.  Diagnosis: Major Neurocognitive Disorder, unspecified.   Recommendations:  Follow-up with referring provider as planned.  The patient is likely to continue to require continued assistance in independent functioning. There is also significant risk additional functional decline, such that greater levels of support may be needed to maintain optimal quality of life and safety. Planning for current and future needs is therefore encouraged. Doing so now will ensure the patient is able to voice his wishes regarding his future care.  Collaborative systems in activities of daily living in which safety is a concern, as discussed during feedback, are recommended as a means of encouraging activity but addressing safety difficulties which can emerge.  6. Cognitive testing does not directly measure driving ability. Some areas of difficulty seen on testing (processing speed, attention) are things needed for driving safely. If the patient or family have concerns of driving safety, I recommend a formal driving evaluation. Below are some places in the community that this can be done. -[facility] in [place]: [phone], -[facility]: [phone], [facility]: [phone], [facility]: [phone] or [phone] A hearing test may be beneficial, although it is possible that comprehension difficulties are not due to hearing loss. That said, untreated hearing loss can increase risk of cognitive decline and also make it more difficult to engage with others consistently. [facility] [phone]) and many other providers in the community can conduct a hearing test if desired. As per [facility] website, no referral is needed for services.  Continued engagement with medical health providers for ongoing management of chronic medical conditions is strongly recommended. Management of cardiovascular health is of particular importance given close relationship between cardiovascular health (heart health) and cerebrovascular health (brain health). This can reduce increase risk of cerebrovascular conditions such as stroke, and worsening of existing chronic cerebrovascular changes. Resuming CPAP is strongly recommended in this regard.  The MIND diet, a modified mediterranean diet, has been found to be beneficial for reducing risk of cognitive decline and may be beneficial for promoting brain health. An internet search for MIND diet will provide many resources with additional information if  interested.  Engaging in cognitively stimulating activities is beneficial for cognitive health and reducing risk of increased cognitive decline. Cognitively stimulating activities are essentially anything that requires your active participation and some level of concentration. It can take many forms. Social interaction is particularly stimulating, for example. Reading, puzzles, and even day to day problem solving are also some examples. Find what works for you! Novelty/variety is helpful and ideally the tasks are things you enjoy. The Alzheimer's disease organization has two chapter in [place]  and provides answers to many questions for patients and families on their website for any diagnosis of Major Neurocognitive Disorder/dementia. They also provide links to caregiver support as well as invite members to local events. If interested, please visit: [email or web address]-garcia.biz/. The Association for Frontotemporal Degeneration (RelicTreasures.se) is another source of information which may be beneficial.  Please feel free to reach out with any additional questions or concerns upon receipt of this report.             Evalene DOROTHA Riff, PsyD             Neuropsychologist This report was generated using voice recognition software. While this document has been carefully reviewed,  transcription errors may be present. I apologize in advance for any inconvenience. Please contact me if further clarification is needed.

## 2024-09-14 NOTE — Progress Notes (Signed)
   NEUROPSYCHOLOGICAL EVALUATION [place]. [facility]  Physical Medicine and Rehabilitation     Patient: Justin Davidson  MRN: [id number] DOB: [birth year]   Service Provider/Clinical Neuropsychologist: Evalene DOROTHA Riff, PsyD  Date of Service: 08/28/24 Start Time: 3 PM End Time: 4 PM  Location of Service:  [facility] Physical Medicine & Rehabilitation Department [place]. [facility] [address], [place] [place], [place] [address] Phone: [phone]   Billing Code/Service: [id number]    Individuals present: Patient, family, Provider (Evalene DOROTHA Riff, PsyD)  Provider conducted the 60-minute interactive feedback appointment in-person with the patient, wife, and daughter.  The provider reviewed and discussed the results of neuropsychological evaluation. Follow-up interviewing was conducted as needed to refine interpretation of findings as needed. Review of results included overall findings, diagnosis, and treatment planning/recommendations that were derived from integration of patient data, interpretation of standardized rest results and clinical data, and clinical decision making, which are documented in the patient's electronic medical record with the full report (date listed below). A copy of the full report will also be mailed to the patient.   The patient expressed understanding of the information reviewed. The patient was provided opportunity to ask questions which were then answered by the provider. The provider worked collaboratively to tailor treatment recommendations to the patient when possible. The patient was informed they could reach out to the provider should additional questions related to the evaluation arise.    The final neuropsychological evaluation report, documented in the patient's chart (DATE 08/21/24), was amended to reflect any additional information obtained during the feedback appointment including treatment planning collaboration.    This  report was generated using voice recognition software. While this document has been carefully reviewed, transcription errors may be present. I apologize in advance for any inconvenience. Please contact me if further clarification is needed.             Evalene DOROTHA Riff, PsyD             Neuropsychologist

## 2024-09-15 ENCOUNTER — Encounter: Payer: Self-pay | Admitting: Neurology

## 2024-09-15 ENCOUNTER — Ambulatory Visit: Admitting: Neurology

## 2024-09-15 VITALS — BP 138/69 | HR 58 | Ht 71.0 in | Wt 320.2 lb

## 2024-09-15 DIAGNOSIS — F02B18 Dementia in other diseases classified elsewhere, moderate, with other behavioral disturbance: Secondary | ICD-10-CM | POA: Diagnosis not present

## 2024-09-15 DIAGNOSIS — R4189 Other symptoms and signs involving cognitive functions and awareness: Secondary | ICD-10-CM | POA: Diagnosis not present

## 2024-09-15 MED ORDER — MEMANTINE HCL 10 MG PO TABS: 10.0000 mg | ORAL_TABLET | Freq: Two times a day (BID) | ORAL | 11 refills | Status: AC

## 2024-09-15 NOTE — Progress Notes (Addendum)
 Provider:  Dedra Gores, MD  Primary Care Physician:  Verdia Lombard, MD [address] [place] [address]     Referring Provider: Verdia Lombard, Md [address] [place],  [place] [address]          Chief Complaint according to patient   Patient presents with:          No memory test.       HISTORY OF PRESENT ILLNESS:  Justin Davidson is a 78 y.o. male patient who is here for revisit 09/15/2024 for    The patient is no longer  followed for OSA : Stopped using CPAP because of belief that was leading to vivid dreaming. Vivid dreaming has persisted despite cessation of CPAP use. Some talking during sleep that has been a change for him. No additional acting out of dreams. Variable sleep durations. Denies problems with onset or maintenance.   Chief concern according to patient :  Cognitive decline:  fell asleep in this visits. He has been on 10 mg donepezil  since 2018.  He was referred  by Dr Ladona, had ben on 23 mg aricept  at that time.  No dx with severe cognitive impairment, see Dr Sheria not from 08-21-2024.    ADL:  Dressing,  Eating ,  Showers without assistance,  Toileting.  No incontinence.   No longer banking, not able to use email, at risk for scams.  He should no longer be driving, he is not safe to drive.   He is agitated  He refuses to accept the result of Dr Sheria testing.  He just got his license renewed (!) .    03/11/24: MM Justin Davidson is a 78 y.o. male with a history of OSA on CPAP and memory disturbance. Returns today for follow-up.  Reports that he has not been using his CPAP.  He states that he typically snatches it off during the night so he has not been putting it on.  Continues to have vivid dreams.   In regards to his memory he feels that it is better. Wife feels that its stable.  Reports that he is able to complete all ADLs independently.  Never has done household chores. Wife now does the finances  but he participates.  Wife reports that he is typically able to drive without getting lost but she does feel that his driving skills is not as good as they used to be.  She has to remind him to stay in the right lane and to not take turns to sharp. Will be seeing Dr. Corina in July for memory testing.     REASON FOR REFERRAL:The patient was referred for neuropsychological evaluation by his providers at neurology due to concerns for cognitive decline involving memory loss, increased agitation, unusually vivid dreams, and family concerns for financial decision making. Per 07/16/2023 visit notes from the referring provider, patient has a history of OSA on CPAP although the patient was reported to have been noncompliant with CPAP use. Patient medical history, per records, also includes Anxiety, Colon cancer (partial colectomy 2021), Colon polyps, Coronary artery disease, Dementia, Dizziness and giddiness, GERD, Hypercholesteremia, Hyperglycemia, Hypertension, OSA, Pre-diabetes, and Prediabetes. Medical records show the patient has been on donepezil  since at least 2018. Date of dementia diagnosis could not be determined based on available electronic records, with first mention indicating a known dementia within a 2020 ED visit note. Documentation may have occurred prior to electronic record integration.  2019 , My consult with Mr Straka.  [name] is a 78 y.o. male patient , seen here  in a referral from Dr. Ladona for an evaluation of possible sleep apnea.  Chief complaint according to patient's  wife  he is short of breath just leaving his car, or climbing stairs. The patient denies being short of breath while walking without incline. His wife stated he is not able to just walk from the parking lot into this office without SOB.  He has a lot of mucous , coughing, and his nose in running. He snores loudly and wakes with a dry mouth . Sleep habits are as follows: he still works part time- comes  home at 7 Pm and has supper, eats and goes to [employer], watches TV and is asleep while doing so. He snores loudly, and after 2 hours goes to the marital bedroom - and lets the TV run.  He sleeps on his side, the left mostly. His wife reports he is mostly supine, sleeps on multiple pillows, has orthopnea! He wakes and needs to urinate 2-3 times each night.  Rises at  7 AM , spontanuously.       Review of Systems: Out of a complete 14 system review, the patient complains of only the following symptoms, and all other reviewed systems are negative.:   SLEEPINESS ?  How likely are you to doze in the following situations: 0 = not likely, 1 = slight chance, 2 = moderate chance, 3 = high chance  Sitting and Reading? Watching Television? Sitting inactive in a public place (theater or meeting)? Lying down in the afternoon when circumstances permit? Sitting and talking to someone? Sitting quietly after lunch without alcohol? In a car, while stopped for a few minutes in traffic? As a passenger in a car for an hour without a break?  Total = 18/ 24   Very agitated.   FSS 53/ 63    Social History   Socioeconomic History   Marital status: Married    Spouse name: [name]   Number of children: 3   Years of education: Not on file   Highest education level: 10th grade  Occupational History   Occupation: retired  Tobacco Use   Smoking status: Never   Smokeless tobacco: Never  Vaping Use   Vaping status: Never Used  Substance and Sexual Activity   Alcohol use: Yes    Comment: occasional   Drug use: Not Currently   Sexual activity: Not on file  Other Topics Concern   Not on file  Social History Narrative   Lives with wife    Right handed   Caffeine: 1 mug of coffee a day   Social Drivers of Corporate investment banker Strain: Not on file  Food Insecurity: Not on file  Transportation Needs: Not on file  Physical Activity: Not on file  Stress: Not on file  Social Connections:  Not on file    Family History  Problem Relation Age of Onset   Stroke Mother    Diabetes Mother    Hypertension Father    Hyperlipidemia Father    Seizures Father    Diabetes Father    Hypertension Sister    Hyperlipidemia Sister    Heart disease Paternal Grandfather    Diabetes Paternal Grandfather    GER disease Daughter    Colon cancer Neg Hx    Esophageal cancer Neg Hx    Inflammatory bowel disease Neg Hx  Liver disease Neg Hx    Pancreatic cancer Neg Hx    Rectal cancer Neg Hx    Stomach cancer Neg Hx    Sleep apnea Neg Hx    Alzheimer's disease Neg Hx    Dementia Neg Hx     Past Medical History:  Diagnosis Date   Anxiety    Colon cancer (HCC)    Colon polyps    Coronary artery disease    Dementia (HCC)    Dizziness and giddiness    GERD (gastroesophageal reflux disease)    Hypercholesteremia    Hyperglycemia    Hypertension    OSA (obstructive sleep apnea)    CPAP    Pre-diabetes    Prediabetes    diet controlled    Past Surgical History:  Procedure Laterality Date   ADRENALECTOMY Left 12/07/2020   Procedure: LEFT ADRENALECTOMY;  Surgeon: Selma Donnice SAUNDERS, MD;  Location: [facility] OR;  Service: Urology;  Laterality: Left;   BIOPSY  09/13/2020   Procedure: BIOPSY;  Surgeon: Wilhelmenia Aloha Raddle., MD;  Location: [facility] ENDOSCOPY;  Service: Gastroenterology;;   cardaic stents     CARDIAC CATHETERIZATION  10/28/2010   CHOLECYSTECTOMY N/A 12/07/2020   Procedure: CHOLECYSTECTOMY;  Surgeon: Dasie Leonor CROME, MD;  Location: [facility] OR;  Service: General;  Laterality: N/A;   COLONOSCOPY     ESOPHAGOGASTRODUODENOSCOPY (EGD) WITH PROPOFOL  N/A 04/19/2020   Procedure: ESOPHAGOGASTRODUODENOSCOPY (EGD) WITH PROPOFOL ;  Surgeon: Rosalie Kitchens, MD;  Location: [facility] ENDOSCOPY;  Service: Endoscopy;  Laterality: N/A;   ESOPHAGOGASTRODUODENOSCOPY (EGD) WITH PROPOFOL  N/A 09/13/2020   Procedure: ESOPHAGOGASTRODUODENOSCOPY (EGD) WITH PROPOFOL ;  Surgeon: Wilhelmenia Aloha Raddle., MD;  Location: [facility]  ENDOSCOPY;  Service: Gastroenterology;  Laterality: N/A;   ESOPHAGOGASTRODUODENOSCOPY (EGD) WITH PROPOFOL  N/A 05/31/2021   Procedure: ESOPHAGOGASTRODUODENOSCOPY (EGD) WITH PROPOFOL ;  Surgeon: Rosalie Kitchens, MD;  Location: [facility] ENDOSCOPY;  Service: Endoscopy;  Laterality: N/A;  please have side viewing scope available   EUS  09/13/2020   Procedure: UPPER ENDOSCOPIC ULTRASOUND (EUS) RADIAL;  Surgeon: Wilhelmenia Aloha Raddle., MD;  Location: [facility] ENDOSCOPY;  Service: Gastroenterology;;   HOT HEMOSTASIS N/A 04/19/2020   Procedure: HOT HEMOSTASIS (ARGON PLASMA COAGULATION/BICAP);  Surgeon: Rosalie Kitchens, MD;  Location: [facility]OSCOPY;  Service: Endoscopy;  Laterality: N/A;   NEPHRECTOMY Left 12/07/2020   Procedure: OPEN LEFT RADICAL NEPHRECTOMY;  Surgeon: Selma Donnice SAUNDERS, MD;  Location: [facility] OR;  Service: Urology;  Laterality: Left;   PARTIAL COLECTOMY  1996   colon cancer   POLYPECTOMY  05/31/2021   Procedure: POLYPECTOMY;  Surgeon: Rosalie Kitchens, MD;  Location: [facility] ENDOSCOPY;  Service: Endoscopy;;   WHIPPLE PROCEDURE N/A 12/07/2020   Procedure: OPEN TRANSDUODENAL AMPULLECTOMY;  Surgeon: Dasie Leonor CROME, MD;  Location: [facility] OR;  Service: General;  Laterality: N/A;     Current Outpatient Medications on File Prior to Visit  Medication Sig Dispense Refill   acetaminophen  (TYLENOL ) 500 MG tablet Take 500 mg by mouth every 6 (six) hours as needed for mild pain, headache or fever.     amLODipine  (NORVASC ) 10 MG tablet Take 1 tablet (10 mg total) by mouth daily. 90 tablet 3   aspirin  EC 81 MG tablet Take by mouth.     docusate sodium  (COLACE) 100 MG capsule Take 100 mg by mouth 2 (two) times daily as needed (constipation).     donepezil  (ARICEPT ) 10 MG tablet TAKE 1 TABLET BY MOUTH EVERY MORNING 30 tablet 4   losartan -hydrochlorothiazide  (HYZAAR) 50-12.5 MG tablet Take 1 tablet by mouth daily.  metFORMIN (GLUCOPHAGE-XR) 500 MG 24 hr tablet Take 500 mg by mouth.     metoprolol  tartrate (LOPRESSOR ) 50 MG tablet TAKE 1  TABLET BY MOUTH TWICE DAILY (Patient taking differently: Take 50 mg by mouth 2 (two) times daily.) 180 tablet 3   potassium chloride  (MICRO-K ) 10 MEQ CR capsule Take 10 mEq by mouth in the morning.  5   Psyllium (METAMUCIL FIBER PO) Take 1 Scoop by mouth 2 (two) times daily.      rosuvastatin  (CRESTOR ) 40 MG tablet TAKE 1 TABLET BY MOUTH ONCE DAILY 90 tablet 11   tamsulosin  (FLOMAX ) 0.4 MG CAPS capsule Take 0.4 mg by mouth in the morning.     lidocaine  (LIDODERM ) 5 % Place 1 patch onto the skin daily. Remove & Discard patch within 12 hours or as directed by MD (Patient not taking: Reported on 09/15/2024) 10 patch 0   No current facility-administered medications on file prior to visit.    Allergies  Allergen Reactions   Lisinopril Cough     DIAGNOSTIC DATA (LABS, IMAGING, TESTING) - I reviewed patient records, labs, notes, testing and imaging myself where available.  Lab Results  Component Value Date   WBC 8.6 01/03/2024   HGB 14.3 01/03/2024   HCT 43.7 01/03/2024   MCV 93.8 01/03/2024   PLT 190 01/03/2024      Component Value Date/Time   NA 142 01/03/2024 0752   NA 142 07/06/2021 1349   K 3.6 01/03/2024 0752   CL 105 01/03/2024 0752   CO2 27 01/03/2024 0752   GLUCOSE 103 (H) 01/03/2024 0752   BUN 17 01/03/2024 0752   BUN 17 07/06/2021 1349   CREATININE 1.26 (H) 01/03/2024 0752   CALCIUM  9.3 01/03/2024 0752   PROT 6.1 (L) 01/03/2024 0752   ALBUMIN  3.6 01/03/2024 0752   AST 18 01/03/2024 0752   ALT 24 01/03/2024 0752   ALKPHOS 59 01/03/2024 0752   BILITOT 0.7 01/03/2024 0752   GFRNONAA 58 (L) 01/03/2024 0752   GFRAA >60 09/13/2020 1350   No results found for: CHOL, HDL, LDLCALC, LDLDIRECT, TRIG, CHOLHDL Lab Results  Component Value Date   HGBA1C 5.5 12/08/2020   No results found for: VITAMINB12 No results found for: TSH  PHYSICAL EXAM:  Vitals:   09/15/24 1032  BP: 138/69  Pulse: (!) 58  SpO2: 95%   No data found. Body mass index is 44.66  kg/m.   Wt Readings from Last 3 Encounters:  09/15/24 (!) 320 lb 3.2 oz (145.2 kg)  03/11/24 (!) 314 lb 6.4 oz (142.6 kg)  01/03/24 (!) 310 lb (140.6 kg)     Ht Readings from Last 3 Encounters:  09/15/24 5' 11 (1.803 m)  03/11/24 5' 11 (1.803 m)  01/03/24 5' 11 (1.803 m)      General: The patient is awake, alert and appears not in acute distress and groomed. Head: Normocephalic, atraumatic.  Neck is supple.    NEUROLOGIC EXAM: The patient was asleep when I entered the room, became agitated uncooperative.      Speech is fluent,  without  dysarthria, dysphonia or aphasia.  Mood and affect are agitated.   Neurological Examination:  Cranial Nerves II-XII: Intact. PERL. EOMI. VFF. No nystagmus.  No facial droop.  No ptosis.  Hearing is grossly intact bilaterally. The tongue is normal and midline. Motor: Strengths are 5/5 throughout.   Muscle bulk and tone are normal. No tremors.  Coordination: No ataxia or dysmetria.  Sensory: Grossly intact throughout to all modalities.  Reflexes: Normal and symmetric throughout. No ankle clonus. Babinski's sign is absent bilaterally. Hoffman's sign is absent bilaterally. Gait and Station: Normal.   Romberg's sign is absent.   ASSESSMENT AND PLAN :   78 y.o. year old male  here with:  Patient stormed out of the exam room when his test results were discussed.  He will not give up driving.    1) Advanced  stage of dementia, agitation and poor reflection- poor insight into his progressed dementia, affecting multiple domains.  2) high degree of sleepiness but not compliant with CPAP. No longer following him for sleep.    In order to  differentiate between forms of dementia  I ordered a [facility] dementia panel and ATN test.   PS :  His daughter wants to become power of attorney.   RV in 6-8 months prn.   I would like to thank Verdia Lombard, MD and Verdia Lombard, Md [address] [place],  [place] [address]  for allowing me to meet with this pleasant patient.   Sleep Clinic Patients are generally offered input on sleep hygiene, life style changes and how to improve compliance with medical treatment where applicable. Review and reiteration of good sleep hygiene measures is offered to any sleep clinic patient, be it in the first consultation or with any follow up visits.  Any patient with sleepiness should be cautioned not to drive, work at heights, or operate dangerous or heavy equipment when feeling tired or sleepy.   The patient's condition requires frequent monitoring and adjustments in the treatment plan, reflecting the ongoing complexity of care.  This provider is the continuing focal point for all needed services for this condition.  After spending a total time of  35  minutes face to face and time for  history taking, physical and neurologic examination, review of laboratory studies,  personal review of imaging studies, reports and results of other testing and review of referral information / records as far as provided in visit,   Electronically signed by: Dedra Gores, MD 09/15/2024 10:53 AM  [facility] Board certified by The [employer] of Sleep Medicine and Diplomate of the [employer] of Sleep Medicine. Board certified In Neurology through the ABPN, Fellow of the [employer] of Neurology.

## 2024-09-15 NOTE — Patient Instructions (Signed)
 Problems With Thinking and Memory (Mild Neurocognitive Disorder): What to Know Mild neurocognitive disorder, formerly known as mild cognitive impairment, is a disorder where your memory doesn't work as well as it should. It may also affect other mental abilities like thinking, communicating, behavior, and being able to finish tasks. These problems can be noticed and measured. But they usually don't stop you from doing daily activities or living on your own. Mild neurocognitive disorder usually happens after 80 years of age. But it can also happen at younger ages. It's not as serious as major neurocognitive disorder, also known as dementia, but it may be the first sign of it. In general, the symptoms of this condition get worse over time. In rare cases, symptoms can get better. What are the causes? This condition may be caused by: Brain disorders like Alzheimer's disease, Parkinson's disease, and other conditions that slowly damage nerve cells. Diseases that affect the blood vessels in the brain and cause small strokes. Certain infections, like HIV. Traumatic brain injury. Other medical conditions, such as brain tumors, underactive thyroid (hypothyroidism), and not having enough vitamin B12. Using certain drugs or medicines. What increases the risk? Being older than 80 years of age. Being male. Having a lower level of education. Diabetes, high blood pressure, high cholesterol, and other conditions that raise the risk for blood vessel diseases. Untreated or undertreated sleep apnea. Having a certain type of gene that can be inherited, or passed down from parent to child. Long-term health problems like heart disease, lung disease, liver disease, kidney disease, or depression. What are the signs or symptoms? Trouble remembering things. You may: Forget names, phone numbers, or details of recent events. Forget about social events and appointments. Often forget where you put your car keys or other  items. Trouble thinking and solving problems. You may have trouble with complex tasks like: Paying bills. Driving in places you don't know well. Trouble communicating. You may have trouble: Finding the right word or naming an object. Forming a sentence that makes sense. Understanding what you read or hear. Changes in your behavior or personality. When this happens, you may: Lose interest in the things you used to enjoy. Avoid being around people. Get angry more easily than usual. Act before thinking. How is this diagnosed? This condition is diagnosed based on: Your symptoms. Your health care provider may ask you and the people you spend time with, like family and friends, about your symptoms. Memory tests and other tests to check how your brain is working. Your provider may refer you to a provider called a neurologist or a mental health specialist. To try to find out the cause of your condition, your provider may: Get a detailed medical history. Ask about use of alcohol, drugs, and medicines. Do a physical exam. Order blood tests and brain imaging tests. How is this treated? Mild neurocognitive disorder that's caused by medicine use, drug use, infection, or another medical condition may get better when the cause is treated, or when medicines or drugs are stopped. If this disorder has another cause, it usually doesn't improve and may get worse. In these cases, the goal of treatment is to help you manage the symptoms. This may include: Medicines to help with memory and behavior symptoms. Talk therapy. This provides education, emotional support, memory aids, and other ways of making up for problems with mental tasks. Lifestyle changes. These may include: Getting regular exercise. Eating a healthy diet that includes omega-3 fatty acids. Doing things to challenge your thinking  and memory skills. Spending more time being with and talking to other people. Using routines like having regular  times for meals and going to bed. Follow these instructions at home: Eating and drinking  Drink more fluids as told. Eat a healthy diet that includes omega-3 fatty acids. These can be found in: Fish. Nuts. Leafy vegetables. Vegetable oils. If you drink alcohol: Limit how much you have to: 0-1 drink a day if you're male. 0-2 drinks a day if you're male. Know how much alcohol is in your drink. In the [place] one drink is one 12 oz bottle of beer (355 mL), one 5 oz glass of wine (148 mL), or one 1 oz glass of hard liquor (44 mL). Lifestyle  Get regular exercise as told by your provider. Do not smoke, vape, or use nicotine or tobacco. Use healthy ways to manage stress. If you need help managing stress, ask your provider. Keep spending time with other people. Keep your mind active by doing activities you enjoy, like reading or playing games. Make sure you get good sleep at night. These tips can help: Try not to take naps during the day. Keep your bedroom dark and cool. Do not exercise in the few hours before you go to bed. Do not have foods or drinks with caffeine at night. General instructions Take medicines only as told. Your provider may tell you to avoid taking medicines that can affect thinking. These include some medicines for pain or sleeping. Work with your provider to find out: What things you need help with. What your safety needs are. Where to find more information [employer] on Aging: [email or web address] Contact a health care provider if: You have any new symptoms. Get help right away if: You have new confusion or your confusion gets worse. You act in ways that put you or your family in danger. This information is not intended to replace advice given to you by your health care provider. Make sure you discuss any questions you have with your health care provider. Document Revised: 05/22/2023 Document Reviewed: 05/22/2023 Elsevier Patient Education  2024 [employer].

## 2024-09-15 NOTE — Addendum Note (Signed)
 Addended by: CHALICE SAUNAS on: 09/15/2024 11:32 AM   Modules accepted: Orders

## 2024-09-16 ENCOUNTER — Telehealth: Payer: Self-pay | Admitting: Neurology

## 2024-09-16 NOTE — Telephone Encounter (Signed)
 no auth required sent to [facility] [phone]

## 2024-09-17 ENCOUNTER — Ambulatory Visit: Payer: Self-pay | Admitting: Neurology

## 2024-09-17 NOTE — Telephone Encounter (Signed)
 Contacted pt daughter, went over lab results and advised we are still waiting for [facility]'s [facility] lab to come back. Advised we will reach out once those results are back. She verbally understood and was appreciative.

## 2024-09-18 ENCOUNTER — Other Ambulatory Visit: Payer: Self-pay | Admitting: Neurology

## 2024-09-18 DIAGNOSIS — G309 Alzheimer's disease, unspecified: Secondary | ICD-10-CM

## 2024-09-18 LAB — COMPREHENSIVE METABOLIC PANEL WITH GFR
ALT: 31 IU/L (ref 0–44)
AST: 22 IU/L (ref 0–40)
Albumin: 4 g/dL (ref 3.8–4.8)
Alkaline Phosphatase: 81 IU/L (ref 47–123)
BUN/Creatinine Ratio: 14 (ref 10–24)
BUN: 20 mg/dL (ref 8–27)
Bilirubin Total: 0.7 mg/dL (ref 0.0–1.2)
CO2: 28 mmol/L (ref 20–29)
Calcium: 9.8 mg/dL (ref 8.6–10.2)
Chloride: 99 mmol/L (ref 96–106)
Creatinine, Ser: 1.4 mg/dL — ABNORMAL HIGH (ref 0.76–1.27)
Globulin, Total: 2.5 g/dL (ref 1.5–4.5)
Glucose: 115 mg/dL — ABNORMAL HIGH (ref 70–99)
Potassium: 3.8 mmol/L (ref 3.5–5.2)
Sodium: 140 mmol/L (ref 134–144)
Total Protein: 6.5 g/dL (ref 6.0–8.5)
eGFR: 51 mL/min/1.73 — ABNORMAL LOW (ref >59)

## 2024-09-18 LAB — ANA W/REFLEX: Anti Nuclear Antibody (ANA): NEGATIVE

## 2024-09-18 LAB — CBC WITH DIFFERENTIAL/PLATELET
Basophils Absolute: 0 x10E3/uL (ref 0.0–0.2)
Basos: 0 %
EOS (ABSOLUTE): 0.3 x10E3/uL (ref 0.0–0.4)
Eos: 3 %
Hematocrit: 47.3 % (ref 37.5–51.0)
Hemoglobin: 15.5 g/dL (ref 13.0–17.7)
Immature Grans (Abs): 0 x10E3/uL (ref 0.0–0.1)
Immature Granulocytes: 0 %
Lymphocytes Absolute: 1.8 x10E3/uL (ref 0.7–3.1)
Lymphs: 20 %
MCH: 31.2 pg (ref 26.6–33.0)
MCHC: 32.8 g/dL (ref 31.5–35.7)
MCV: 95 fL (ref 79–97)
Monocytes Absolute: 0.7 x10E3/uL (ref 0.1–0.9)
Monocytes: 8 %
Neutrophils Absolute: 6.2 x10E3/uL (ref 1.4–7.0)
Neutrophils: 69 %
Platelets: 202 x10E3/uL (ref 150–450)
RBC: 4.97 x10E6/uL (ref 4.14–5.80)
RDW: 13.4 % (ref 11.6–15.4)
WBC: 9 x10E3/uL (ref 3.4–10.8)

## 2024-09-18 LAB — PROTEIN ELECTROPHORESIS, SERUM
A/G Ratio: 1 (ref 0.7–1.7)
Albumin ELP: 3.2 g/dL (ref 2.9–4.4)
Alpha 1: 0.2 g/dL (ref 0.0–0.4)
Alpha 2: 0.9 g/dL (ref 0.4–1.0)
Beta: 0.9 g/dL (ref 0.7–1.3)
Gamma Globulin: 1.2 g/dL (ref 0.4–1.8)
Globulin, Total: 3.3 g/dL (ref 2.2–3.9)
M-Spike, %: 0.5 g/dL — AB

## 2024-09-18 LAB — ATN PROFILE
A -- Beta-amyloid 42/40 Ratio: 0.113 (ref >0.102)
Beta-amyloid 40: 200.96 pg/mL
Beta-amyloid 42: 22.78 pg/mL
N -- NfL, Plasma: 2.36 pg/mL (ref 0.00–6.04)
T -- p-tau181: 1.29 pg/mL — AB (ref 0.00–0.97)

## 2024-09-18 LAB — SEDIMENTATION RATE: Sed Rate: 44 mm/h — ABNORMAL HIGH (ref 0–30)

## 2024-09-18 LAB — HEMOGLOBIN A1C
Est. average glucose Bld gHb Est-mCnc: 126 mg/dL
Hgb A1c MFr Bld: 6 % — ABNORMAL HIGH (ref 4.8–5.6)

## 2024-09-18 LAB — HIV ANTIBODY (ROUTINE TESTING W REFLEX): HIV Screen 4th Generation wRfx: NONREACTIVE

## 2024-09-18 LAB — VITAMIN B12: Vitamin B-12: 317 pg/mL (ref 232–1245)

## 2024-09-18 LAB — TSH+FREE T4
Free T4: 0.98 ng/dL (ref 0.82–1.77)
TSH: 5.14 u[IU]/mL — ABNORMAL HIGH (ref 0.450–4.500)

## 2024-09-18 LAB — RPR: RPR Ser Ql: NONREACTIVE

## 2024-09-18 LAB — HOMOCYSTEINE: Homocysteine: 11 umol/L (ref 0.0–19.2)

## 2024-09-18 NOTE — Telephone Encounter (Signed)
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  Please ask daughter about Mr Raynors willingness and ability to undergo a metabolic PET scan ( its shorter than an MRI ) , and to undergo detailed neuropsychological examination.  I will then order the studies . CD  ----- Message ----- From: Rebecka Memos Lab Results In Sent: 09/16/2024   7:37 AM EDT To: Dedra Gores, MD

## 2024-09-18 NOTE — Telephone Encounter (Signed)
 Spoke to daughter (checked dpr) was very concerned of Patient having mri brain and PET scan . Daughter was asking detailed questions about the P tau protein . Daughter would like Dr Chalice to call her back and discuss further . Daughter was very thankful for my patience however wanted to discuss further with MD before agreeing to further testing . Will forward to Dr Chalice.

## 2024-09-19 NOTE — Telephone Encounter (Signed)
 [employer] medicare shara: [id number] exp. 09/19/24-11/03/24 sent to [facility] [phone]

## 2024-09-23 NOTE — Progress Notes (Signed)
 Cardiology Office Note:  .   Date:  10/07/2024  ID:  Lilian Chalk, DOB 1946/08/08, MRN [id number] PCP: Verdia Lombard, MD  [facility] Providers Cardiologist:  Gordy Bergamo, MD    History of Present Illness: .   Justin Davidson is a 78 y.o. 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   Patient here with his wife and daughter. He has chronic DOE unchanged, no chest pain. Still not using CPAP. No regular exercise. Labs reviewed from 09/15/24.   ROS:    Studies Reviewed: SABRA    EKG Interpretation Date/Time:  Tuesday October 07 2024 91:71:71 EDT Ventricular Rate:  57 PR Interval:  194 QRS Duration:  82 QT Interval:  404 QTC Calculation: 393 R Axis:   44  Text Interpretation: Sinus bradycardia Nonspecific T wave abnormality When compared with ECG of 30-Oct-2022 10:21, No significant change was found Confirmed by Parthenia Klinefelter [phone]) on 10/07/2024 8:40:25 AM    Prior CV Studies: Coronary angiogram 10/28/10:  3.5x23, Promus mid CX and 2.5x23 Promus mid Ramus intermediate (DES). Residual 50% -60% Mid LAD.   Lexiscan  (Walking with mod Bruce)Tetrofosmin  Stress Test  10/25/2020: Nondiagnostic ECG stress. There is diaphragmatic attenuation noted in the inferior wall. There is a fixed defect in the inferior region from base to the apex without reversibility suggestive of a large scar.  No stress lung uptake. TID is normal. No stress lung uptake.  Overall LV systolic function is normal with regional wall motion abnormality with akinesis in the  inferior wall. Stress LV EF: 53%.  No significant change from 04/12/2018. Intermediate risk study.    Echocardiogram 11/11/2020: Left ventricle cavity is normal in size and wall thickness. Normal global wall motion. Normal LV systolic function with EF 55%. Doppler evidence of grade I (impaired) diastolic dysfunction, normal LAP. Structurally normal trileaflet aortic valve.  Mild (Grade I) aortic regurgitation. No evidence of pulmonary hypertension. Unlike previous study in 2019, pulmonary hypertension not seen on this study.   Carotid artery duplex 06/30/2021: Duplex suggests stenosis in the right internal carotid artery (16-49%). Duplex suggests stenosis in the right external carotid artery (<50%). Duplex suggests stenosis in the left internal carotid artery (16-49%). Duplex suggests stenosis in the left external carotid artery (<50%). Antegrade right vertebral artery flow. Left vertebral artery flow is not visualized. Follow up in one year is appropriate if clinically indicated.   Echocardiogram 06/29/2022: Normal LV systolic function with visual EF 60-65%. Left ventricle cavity is normal in size. Mild left ventricular hypertrophy. Normal global wall motion. Normal diastolic filling pattern, normal LAP.  Mild (Grade I) aortic regurgitation. Mild tricuspid regurgitation. No evidence of pulmonary hypertension. The aortic root is normal. 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 Compared to 11/11/2020 Grade 1 DD is now normal,  mildly dilated proximal ascending is new otherwise no significant change.        Risk Assessment/Calculations:             Physical Exam:   VS:  BP 112/62   Pulse (!) 59   Ht 5' 11 (1.803 m)   Wt (!) 318 lb (144.2 kg)   SpO2 96%  BMI 44.35 kg/m    Orhtostatics: No data found. Wt Readings from Last 3 Encounters:  10/07/24 (!) 318 lb (144.2 kg)  09/15/24 (!) 320 lb 3.2 oz (145.2 kg)  03/11/24 (!) 314 lb 6.4 oz (142.6 kg)    GEN: Obese, in no acute  distress NECK: No JVD; No carotid bruits CARDIAC:  RRR, no murmurs, rubs, gallops RESPIRATORY:  Clear to auscultation without rales, wheezing or rhonchi  ABDOMEN: Soft, non-tender, non-distended EXTREMITIES:  No edema; No deformity   ASSESSMENT AND PLAN: .   Coronary artery disease involving native coronary artery of native heart without angina pectoris Patient is presently doing well and has not had any recurrence of angina pectoris.  Chronic dyspnea has remained stable,   Essential hypertension Blood pressure is under excellent control.  He is presently on amlodipine  and losartan  hydrochlorothiazide , metoprolol  with excellent control of hypertension.   Hypercholesteremia  LDL 86 05/2024 goal less than 55 on crestor  40 mg daily. Add zetia 10 mg once daily. Repeat FLP in 3 months.      Asymptomatic bilateral carotid artery stenosis Minimal on US  2024  Morbid obesity-diet and exercise discussed           Dispo: f/u Dr. Ladona 1 yr.  Signed, Olivia Pavy, PA-C

## 2024-10-03 ENCOUNTER — Ambulatory Visit: Payer: Self-pay | Admitting: Neurology

## 2024-10-03 ENCOUNTER — Encounter (HOSPITAL_COMMUNITY)
Admission: RE | Admit: 2024-10-03 | Discharge: 2024-10-03 | Disposition: A | Discharge status: Home / Self Care | Source: Ambulatory Visit | Attending: Neurology | Admitting: Neurology

## 2024-10-03 DIAGNOSIS — G309 Alzheimer's disease, unspecified: Secondary | ICD-10-CM | POA: Insufficient documentation

## 2024-10-03 DIAGNOSIS — G319 Degenerative disease of nervous system, unspecified: Secondary | ICD-10-CM | POA: Diagnosis not present

## 2024-10-03 DIAGNOSIS — R413 Other amnesia: Secondary | ICD-10-CM | POA: Diagnosis not present

## 2024-10-03 LAB — GLUCOSE, CAPILLARY: Glucose-Capillary: 124 mg/dL — ABNORMAL HIGH (ref 70–99)

## 2024-10-03 MED ORDER — FLUDEOXYGLUCOSE F - 18 (FDG) INJECTION
10.0000 | Freq: Once | INTRAVENOUS | Status: AC
  Administered 2024-10-03: 10 via INTRAVENOUS

## 2024-10-06 NOTE — Telephone Encounter (Signed)
-----   Message from [place] Dohmeier sent at 10/03/2024  1:23 PM EDT ----- Normal PET scan of the brain ! No metabolic abnormalities in the brain were seen.  This PET scan does not point towards any dementia disorder subtype.  ----- Message ----- From: Interface, Rad Results In Sent: 10/03/2024  10:32 AM EDT To: Dedra Gores, MD

## 2024-10-06 NOTE — Telephone Encounter (Signed)
 Called daughter/[name] (on [place]) at [phone]. LVM

## 2024-10-07 ENCOUNTER — Ambulatory Visit: Attending: Physician Assistant | Admitting: Physician Assistant

## 2024-10-07 ENCOUNTER — Encounter: Payer: Self-pay | Admitting: Neurology

## 2024-10-07 ENCOUNTER — Encounter: Payer: Self-pay | Admitting: Physician Assistant

## 2024-10-07 VITALS — BP 112/62 | HR 59 | Ht 71.0 in | Wt 318.0 lb

## 2024-10-07 DIAGNOSIS — I2510 Atherosclerotic heart disease of native coronary artery without angina pectoris: Secondary | ICD-10-CM | POA: Diagnosis not present

## 2024-10-07 DIAGNOSIS — E7800 Pure hypercholesterolemia, unspecified: Secondary | ICD-10-CM | POA: Diagnosis not present

## 2024-10-07 DIAGNOSIS — I6523 Occlusion and stenosis of bilateral carotid arteries: Secondary | ICD-10-CM | POA: Diagnosis not present

## 2024-10-07 DIAGNOSIS — I10 Essential (primary) hypertension: Secondary | ICD-10-CM

## 2024-10-07 MED ORDER — EZETIMIBE 10 MG PO TABS: 10.0000 mg | ORAL_TABLET | Freq: Every day | ORAL | 3 refills | Status: AC

## 2024-10-07 NOTE — Patient Instructions (Signed)
 Medication Instructions:  Your physician has recommended you make the following change in your medication:   ** Begin Zetia 10mg  - 1 tablet by mouth daily  *If you need a refill on your cardiac medications before your next appointment, please call your pharmacy*  Lab Work: Fasting Lipid Panel in 3 months If you have labs (blood work) drawn today and your tests are completely normal, you will receive your results only by: MyChart Message (if you have MyChart) OR A paper copy in the mail If you have any lab test that is abnormal or we need to change your treatment, we will call you to review the results.  Testing/Procedures: None ordered.   Follow-Up: At [facility], you and your health needs are our priority.  As part of our continuing mission to provide you with exceptional heart care, our providers are all part of one team.  This team includes your primary Cardiologist (physician) and Advanced Practice Providers or APPs (Physician Assistants and Nurse Practitioners) who all work together to provide you with the care you need, when you need it.  Your next appointment:   6 months with Dr Ladona  We recommend signing up for the patient portal called MyChart.  Sign up information is provided on this After Visit Summary.  MyChart is used to connect with patients for Virtual Visits (Telemedicine).  Patients are able to view lab/test results, encounter notes, upcoming appointments, etc.  Non-urgent messages can be sent to your provider as well.   To learn more about what you can do with MyChart, go to [email or web address].   Other Instructions Strive for 150 minutes of exercise per week. Work on weight loss

## 2024-10-13 NOTE — Telephone Encounter (Signed)
 I called daughter and relayed results of PET scan, normal (does not point toward any dementia disorder).  She verbalized understanding.

## 2024-10-13 NOTE — Telephone Encounter (Signed)
-----   Message from [place] Dohmeier sent at 10/03/2024  1:23 PM EDT ----- Normal PET scan of the brain ! No metabolic abnormalities in the brain were seen.  This PET scan does not point towards any dementia disorder subtype.  ----- Message ----- From: Interface, Rad Results In Sent: 10/03/2024  10:32 AM EDT To: Dedra Gores, MD

## 2024-10-20 LAB — LIPID PANEL
Chol/HDL Ratio: 4.1 ratio (ref 0.0–5.0)
Cholesterol, Total: 141 mg/dL (ref 100–199)
HDL: 34 mg/dL — ABNORMAL LOW (ref >39)
LDL Chol Calc (NIH): 81 mg/dL (ref 0–99)
Triglycerides: 151 mg/dL — ABNORMAL HIGH (ref 0–149)
VLDL Cholesterol Cal: 26 mg/dL (ref 5–40)

## 2024-10-21 ENCOUNTER — Ambulatory Visit: Payer: Self-pay | Admitting: Physician Assistant

## 2024-10-22 ENCOUNTER — Other Ambulatory Visit: Payer: Self-pay | Admitting: Adult Health

## 2024-10-27 ENCOUNTER — Other Ambulatory Visit: Payer: Self-pay | Admitting: Internal Medicine

## 2024-10-27 DIAGNOSIS — K8689 Other specified diseases of pancreas: Secondary | ICD-10-CM

## 2024-11-14 ENCOUNTER — Other Ambulatory Visit

## 2024-11-17 ENCOUNTER — Other Ambulatory Visit

## 2025-04-20 ENCOUNTER — Ambulatory Visit: Admitting: Neurology
